# Patient Record
Sex: Female | Born: 1985 | Race: Black or African American | Hispanic: No | Marital: Single | Smoking: Former smoker
Health system: Southern US, Community
[De-identification: ages and names within clinical notes are randomized; demographics above are authoritative.]

## PROBLEM LIST (undated history)

## (undated) DIAGNOSIS — Z8619 Personal history of other infectious and parasitic diseases: Secondary | ICD-10-CM

## (undated) DIAGNOSIS — E785 Hyperlipidemia, unspecified: Secondary | ICD-10-CM

## (undated) DIAGNOSIS — Z5189 Encounter for other specified aftercare: Secondary | ICD-10-CM

## (undated) DIAGNOSIS — R51 Headache: Secondary | ICD-10-CM

## (undated) DIAGNOSIS — IMO0002 Reserved for concepts with insufficient information to code with codable children: Secondary | ICD-10-CM

## (undated) DIAGNOSIS — O09299 Supervision of pregnancy with other poor reproductive or obstetric history, unspecified trimester: Secondary | ICD-10-CM

## (undated) DIAGNOSIS — R87619 Unspecified abnormal cytological findings in specimens from cervix uteri: Secondary | ICD-10-CM

## (undated) HISTORY — DX: Hyperlipidemia, unspecified: E78.5

## (undated) HISTORY — DX: Reserved for concepts with insufficient information to code with codable children: IMO0002

## (undated) HISTORY — DX: Personal history of other infectious and parasitic diseases: Z86.19

## (undated) HISTORY — DX: Unspecified abnormal cytological findings in specimens from cervix uteri: R87.619

## (undated) HISTORY — DX: Headache: R51

## (undated) HISTORY — DX: Encounter for other specified aftercare: Z51.89

## (undated) HISTORY — PX: NOSE SURGERY: SHX723

---

## 2012-06-03 NOTE — L&D Delivery Note (Signed)
Delivery Note At 8:57 PM a viable female was delivered via Vaginal, Spontaneous Delivery (Presentation: Left Occiput Anterior).  APGAR: , ; weight .   Placenta status: Intact, Spontaneous.  Cord: 3 vessels with the following complications: None.  Cord pH: not sent  Anesthesia: Epidural  Episiotomy: None Lacerations: 2nd degree Suture Repair: 3.0 vicryl rapide Est. Blood Loss (mL): 300  Mom to postpartum.  Baby to nursery-stable.  Meriel Pica 01/05/2013, 9:17 PM

## 2012-08-24 LAB — OB RESULTS CONSOLE ABO/RH: RH Type: POSITIVE

## 2012-08-24 LAB — OB RESULTS CONSOLE RUBELLA ANTIBODY, IGM: Rubella: IMMUNE

## 2012-08-24 LAB — OB RESULTS CONSOLE HEPATITIS B SURFACE ANTIGEN: Hepatitis B Surface Ag: NEGATIVE

## 2012-08-24 LAB — OB RESULTS CONSOLE ANTIBODY SCREEN: Antibody Screen: NEGATIVE

## 2012-12-31 ENCOUNTER — Encounter (HOSPITAL_COMMUNITY): Payer: Self-pay | Admitting: *Deleted

## 2012-12-31 ENCOUNTER — Inpatient Hospital Stay (HOSPITAL_COMMUNITY)
Admission: AD | Admit: 2012-12-31 | Discharge: 2012-12-31 | Disposition: A | Discharge status: Home / Self Care | Payer: 59 | Source: Ambulatory Visit | Attending: Obstetrics and Gynecology | Admitting: Obstetrics and Gynecology

## 2012-12-31 DIAGNOSIS — O368131 Decreased fetal movements, third trimester, fetus 1: Secondary | ICD-10-CM

## 2012-12-31 DIAGNOSIS — O368190 Decreased fetal movements, unspecified trimester, not applicable or unspecified: Secondary | ICD-10-CM | POA: Insufficient documentation

## 2012-12-31 NOTE — MAU Note (Signed)
Pt reports she has not felt the baby move for the last 2 hours.

## 2012-12-31 NOTE — MAU Provider Note (Signed)
  History     CSN: [id number]  Arrival date and time: 12/31/12 1957   None     Chief Complaint  Patient presents with  . Decreased Fetal Movement   HPI This is a 27 y.o. at [redacted]w[redacted]d who presents with c/o lack of fetal movement at home. Now feels it moving. Denies pain, leaking or bleeding  RN Note:  Pt reports she has not felt the baby move for the last 2 hours.        OB History   Grav Para Term Preterm Abortions TAB SAB Ect Mult Living   1               Past Medical History  Diagnosis Date  . Medical history non-contributory     Past Surgical History  Procedure Laterality Date  . No past surgeries      History reviewed. No pertinent family history.  History  Substance Use Topics  . Smoking status: Heavy Tobacco Smoker -- 1.00 packs/day  . Smokeless tobacco: Never Used  . Alcohol Use: No    Allergies: No Known Allergies  Prescriptions prior to admission  Medication Sig Dispense Refill  . Prenatal Vit-Fe Fumarate-FA (PRENATAL MULTIVITAMIN) TABS Take 1 tablet by mouth daily.        Review of Systems  Constitutional: Negative for fever and chills.  Gastrointestinal: Negative for nausea, vomiting, abdominal pain, diarrhea and constipation.  Neurological: Negative for dizziness.   Physical Exam   Blood pressure 139/86, pulse 81, temperature 98.7 F (37.1 C), temperature source Oral, resp. rate 20, height 5\' 5"  (1.651 m), weight 109.317 kg (241 lb), SpO2 100.00%.  Physical Exam  Constitutional: She is oriented to person, place, and time. She appears well-developed and well-nourished. No distress.  Cardiovascular: Normal rate.   Respiratory: Effort normal.  GI: Soft. There is no tenderness.  Musculoskeletal: Normal range of motion.  Neurological: She is alert and oriented to person, place, and time.  Skin: Skin is warm and dry.  Psychiatric: She has a normal mood and affect.   Fetal heart rate reactive, numerous accels and average variability Category  I Irregular mild contractions   MAU Course  Procedures  Assessment and Plan  A:  SIUP at [redacted]w[redacted]d      History of decreased fetal movement, now moving a lot      Reassuring fetal heart rate tracing, Category I  P: Discharge home      Discussed with Dr Gweneth Dimitri 12/31/2012, 9:16 PM

## 2013-01-04 ENCOUNTER — Encounter (HOSPITAL_COMMUNITY): Payer: Self-pay | Admitting: *Deleted

## 2013-01-04 ENCOUNTER — Telehealth (HOSPITAL_COMMUNITY): Payer: Self-pay | Admitting: *Deleted

## 2013-01-04 ENCOUNTER — Inpatient Hospital Stay (HOSPITAL_COMMUNITY)
Admission: AD | Admit: 2013-01-04 | Discharge: 2013-01-08 | DRG: 774 | Disposition: A | Discharge status: Home / Self Care | Payer: 59 | Source: Ambulatory Visit | Attending: Obstetrics and Gynecology | Admitting: Obstetrics and Gynecology

## 2013-01-04 DIAGNOSIS — O99892 Other specified diseases and conditions complicating childbirth: Secondary | ICD-10-CM | POA: Diagnosis present

## 2013-01-04 DIAGNOSIS — O139 Gestational [pregnancy-induced] hypertension without significant proteinuria, unspecified trimester: Principal | ICD-10-CM | POA: Diagnosis present

## 2013-01-04 DIAGNOSIS — O163 Unspecified maternal hypertension, third trimester: Secondary | ICD-10-CM

## 2013-01-04 DIAGNOSIS — Z22330 Carrier of Group B streptococcus: Secondary | ICD-10-CM

## 2013-01-04 LAB — COMPREHENSIVE METABOLIC PANEL
ALT: 10 U/L (ref 0–35)
Albumin: 2.6 g/dL — ABNORMAL LOW (ref 3.5–5.2)
Alkaline Phosphatase: 132 U/L — ABNORMAL HIGH (ref 39–117)
BUN: 4 mg/dL — ABNORMAL LOW (ref 6–23)
Calcium: 8.6 mg/dL (ref 8.4–10.5)
GFR calc Af Amer: >90 mL/min (ref >90)
Glucose, Bld: 97 mg/dL (ref 70–99)
Potassium: 3.1 mEq/L — ABNORMAL LOW (ref 3.5–5.1)
Sodium: 134 mEq/L — ABNORMAL LOW (ref 135–145)
Total Protein: 6.1 g/dL (ref 6.0–8.3)

## 2013-01-04 LAB — URINALYSIS, ROUTINE W REFLEX MICROSCOPIC
Bilirubin Urine: NEGATIVE
Ketones, ur: NEGATIVE mg/dL
Leukocytes, UA: NEGATIVE
Nitrite: NEGATIVE
Protein, ur: NEGATIVE mg/dL

## 2013-01-04 LAB — CBC
Hemoglobin: 11 g/dL — ABNORMAL LOW (ref 12.0–15.0)
MCH: 27.7 pg (ref 26.0–34.0)
MCHC: 34.2 g/dL (ref 30.0–36.0)
RDW: 14.3 % (ref 11.5–15.5)

## 2013-01-04 LAB — PROTEIN / CREATININE RATIO, URINE
Creatinine, Urine: 88.33 mg/dL
Protein Creatinine Ratio: 0.17 — ABNORMAL HIGH (ref 0.00–0.15)

## 2013-01-04 MED ORDER — OXYCODONE-ACETAMINOPHEN 5-325 MG PO TABS
1.0000 | ORAL_TABLET | ORAL | Status: DC | PRN
  Administered 2013-01-05: 1 via ORAL
  Filled 2013-01-04: qty 1

## 2013-01-04 MED ORDER — ACETAMINOPHEN 325 MG PO TABS
650.0000 mg | ORAL_TABLET | ORAL | Status: DC | PRN
  Administered 2013-01-05: 650 mg via ORAL
  Filled 2013-01-04: qty 2

## 2013-01-04 MED ORDER — MISOPROSTOL 25 MCG QUARTER TABLET
25.0000 ug | ORAL_TABLET | ORAL | Status: DC | PRN
  Administered 2013-01-04: 25 ug via VAGINAL
  Filled 2013-01-04: qty 0.25
  Filled 2013-01-04: qty 1

## 2013-01-04 MED ORDER — LIDOCAINE HCL (PF) 1 % IJ SOLN
30.0000 mL | INTRAMUSCULAR | Status: DC | PRN
  Administered 2013-01-05: 30 mL via SUBCUTANEOUS
  Filled 2013-01-04 (×2): qty 30

## 2013-01-04 MED ORDER — OXYTOCIN BOLUS FROM INFUSION
500.0000 mL | INTRAVENOUS | Status: DC
  Administered 2013-01-05: 500 mL via INTRAVENOUS

## 2013-01-04 MED ORDER — LACTATED RINGERS IV SOLN
INTRAVENOUS | Status: DC
  Administered 2013-01-05: 07:00:00 via INTRAVENOUS

## 2013-01-04 MED ORDER — LACTATED RINGERS IV SOLN
500.0000 mL | INTRAVENOUS | Status: DC | PRN
  Administered 2013-01-05: 500 mL via INTRAVENOUS

## 2013-01-04 MED ORDER — IBUPROFEN 600 MG PO TABS: 600.0000 mg | ORAL_TABLET | Freq: Four times a day (QID) | ORAL | Status: DC | PRN

## 2013-01-04 MED ORDER — ONDANSETRON HCL 4 MG/2ML IJ SOLN: 4.0000 mg | Freq: Four times a day (QID) | INTRAMUSCULAR | Status: DC | PRN

## 2013-01-04 MED ORDER — OXYTOCIN 40 UNITS IN LACTATED RINGERS INFUSION - SIMPLE MED
1.0000 m[IU]/min | INTRAVENOUS | Status: DC
  Administered 2013-01-05: 1 m[IU]/min via INTRAVENOUS
  Filled 2013-01-04: qty 1000

## 2013-01-04 MED ORDER — TERBUTALINE SULFATE 1 MG/ML IJ SOLN: 0.2500 mg | Freq: Once | INTRAMUSCULAR | Status: AC | PRN

## 2013-01-04 MED ORDER — CITRIC ACID-SODIUM CITRATE 334-500 MG/5ML PO SOLN: 30.0000 mL | ORAL | Status: DC | PRN

## 2013-01-04 MED ORDER — PENICILLIN G POTASSIUM 5000000 UNITS IJ SOLR
2.5000 10*6.[IU] | INTRAVENOUS | Status: DC
  Administered 2013-01-05 (×3): 2.5 10*6.[IU] via INTRAVENOUS
  Filled 2013-01-04 (×8): qty 2.5

## 2013-01-04 MED ORDER — OXYTOCIN 40 UNITS IN LACTATED RINGERS INFUSION - SIMPLE MED
62.5000 mL/h | INTRAVENOUS | Status: DC
  Filled 2013-01-04: qty 1000

## 2013-01-04 MED ORDER — FLEET ENEMA 7-19 GM/118ML RE ENEM: 1.0000 | ENEMA | RECTAL | Status: DC | PRN

## 2013-01-04 MED ORDER — PENICILLIN G POTASSIUM 5000000 UNITS IJ SOLR
5.0000 10*6.[IU] | Freq: Once | INTRAVENOUS | Status: AC
  Administered 2013-01-05: 5 10*6.[IU] via INTRAVENOUS
  Filled 2013-01-04: qty 5

## 2013-01-04 NOTE — MAU Note (Addendum)
PT SAYS   SHE WAS HERE LAST Thursday-  DECREASE FETAL MOVEMENT AND SEEING SPOTS.    SHE WAS AT DR Langston Masker OFFICE TODAY AT 230PM--  TOLD HER TO COME HER  FOR BP-   149/?  .    SAYS LAST WEEK BP WAS ELEVATED- 142/ ?    .  IN OFFICE -UA.     DENIES H/A,  BLURRED VISION, CHEST PAIN.   VE IN OFFICE-  1 CM.    DENIES HSV AND MRSA.  IS AN INDUCTION FOR  8-10.        I CALLED L/D - ST[facility]Y -  SAYS PT IS NOT AN INDUCTION FOR   TODAY.

## 2013-01-04 NOTE — Telephone Encounter (Signed)
Preadmission screen  

## 2013-01-04 NOTE — MAU Note (Signed)
Dr. Vincente Poli notified of patient arrival in MAU. Dr. Vincente Poli indicated that this patient was to be a direct admit to the [facility]. Notified patient of this status.

## 2013-01-04 NOTE — MAU Note (Signed)
GAVE CHRISTY  REPORT- BUT CANNOT TAKE PT X1 HR.

## 2013-01-05 ENCOUNTER — Encounter (HOSPITAL_COMMUNITY): Payer: Self-pay | Admitting: Obstetrics

## 2013-01-05 ENCOUNTER — Inpatient Hospital Stay (HOSPITAL_COMMUNITY): Payer: 59 | Admitting: Anesthesiology

## 2013-01-05 ENCOUNTER — Encounter (HOSPITAL_COMMUNITY): Payer: Self-pay | Admitting: Anesthesiology

## 2013-01-05 DIAGNOSIS — Z5189 Encounter for other specified aftercare: Secondary | ICD-10-CM

## 2013-01-05 HISTORY — DX: Encounter for other specified aftercare: Z51.89

## 2013-01-05 LAB — CBC
HCT: 24.4 % — ABNORMAL LOW (ref 36.0–46.0)
Hemoglobin: 10.9 g/dL — ABNORMAL LOW (ref 12.0–15.0)
Hemoglobin: 8.7 g/dL — ABNORMAL LOW (ref 12.0–15.0)
Hemoglobin: 8.4 g/dL — ABNORMAL LOW (ref 12.0–15.0)
MCH: 28.5 pg (ref 26.0–34.0)
MCHC: 34.4 g/dL (ref 30.0–36.0)
MCHC: 34.5 g/dL (ref 30.0–36.0)
MCHC: 34.4 g/dL (ref 30.0–36.0)
MCV: 82.6 fL (ref 78.0–100.0)
RBC: 3.93 MIL/uL (ref 3.87–5.11)
RBC: 3.05 MIL/uL — ABNORMAL LOW (ref 3.87–5.11)
RBC: 3.01 MIL/uL — ABNORMAL LOW (ref 3.87–5.11)
WBC: 9.3 10*3/uL (ref 4.0–10.5)
WBC: 21.1 10*3/uL — ABNORMAL HIGH (ref 4.0–10.5)

## 2013-01-05 LAB — DIC (DISSEMINATED INTRAVASCULAR COAGULATION)PANEL
Fibrinogen: 384 mg/dL (ref 204–475)
Prothrombin Time: 14.6 seconds (ref 11.6–15.2)
Smear Review: NONE SEEN

## 2013-01-05 LAB — PREPARE RBC (CROSSMATCH)

## 2013-01-05 LAB — RPR: RPR Ser Ql: NONREACTIVE

## 2013-01-05 LAB — POSTPARTUM HEMORRHAGE PROTOCOL (BB NOTIFICATION)

## 2013-01-05 MED ORDER — DIPHENHYDRAMINE HCL 50 MG/ML IJ SOLN: 12.5000 mg | INTRAMUSCULAR | Status: DC | PRN

## 2013-01-05 MED ORDER — METHYLERGONOVINE MALEATE 0.2 MG/ML IJ SOLN: 0.2000 mg | Freq: Once | INTRAMUSCULAR | Status: AC

## 2013-01-05 MED ORDER — EPHEDRINE 5 MG/ML INJ
10.0000 mg | INTRAVENOUS | Status: DC | PRN
  Filled 2013-01-05: qty 2

## 2013-01-05 MED ORDER — LACTATED RINGERS IV SOLN: INTRAVENOUS | Status: DC

## 2013-01-05 MED ORDER — PROMETHAZINE HCL 25 MG/ML IJ SOLN
12.5000 mg | Freq: Once | INTRAMUSCULAR | Status: AC
  Administered 2013-01-05: 12.5 mg via INTRAVENOUS
  Filled 2013-01-05: qty 1

## 2013-01-05 MED ORDER — OXYTOCIN 40 UNITS IN LACTATED RINGERS INFUSION - SIMPLE MED
125.0000 mL/h | INTRAVENOUS | Status: DC
  Administered 2013-01-06: 125 mL/h via INTRAVENOUS

## 2013-01-05 MED ORDER — PHENYLEPHRINE 40 MCG/ML (10ML) SYRINGE FOR IV PUSH (FOR BLOOD PRESSURE SUPPORT)
80.0000 ug | PREFILLED_SYRINGE | INTRAVENOUS | Status: DC | PRN
  Filled 2013-01-05: qty 2

## 2013-01-05 MED ORDER — FENTANYL 2.5 MCG/ML BUPIVACAINE 1/10 % EPIDURAL INFUSION (WH - ANES)
14.0000 mL/h | INTRAMUSCULAR | Status: DC | PRN
  Administered 2013-01-05: 14 mL/h via EPIDURAL
  Filled 2013-01-05 (×2): qty 125

## 2013-01-05 MED ORDER — METHYLERGONOVINE MALEATE 0.2 MG/ML IJ SOLN
INTRAMUSCULAR | Status: AC
  Administered 2013-01-05: 0.2 mg via INTRAMUSCULAR
  Filled 2013-01-05: qty 1

## 2013-01-05 MED ORDER — EPHEDRINE 5 MG/ML INJ
10.0000 mg | INTRAVENOUS | Status: DC | PRN
  Filled 2013-01-05: qty 4
  Filled 2013-01-05: qty 2

## 2013-01-05 MED ORDER — LIDOCAINE HCL (PF) 1 % IJ SOLN
INTRAMUSCULAR | Status: DC | PRN
  Administered 2013-01-05 (×4): 4 mL

## 2013-01-05 MED ORDER — CARBOPROST TROMETHAMINE 250 MCG/ML IM SOLN
INTRAMUSCULAR | Status: AC
  Administered 2013-01-05: 250 ug via INTRAMUSCULAR
  Filled 2013-01-05: qty 1

## 2013-01-05 MED ORDER — BUTORPHANOL TARTRATE 1 MG/ML IJ SOLN
1.0000 mg | Freq: Once | INTRAMUSCULAR | Status: AC
  Administered 2013-01-05: 1 mg via INTRAVENOUS
  Filled 2013-01-05: qty 1

## 2013-01-05 MED ORDER — ZOLPIDEM TARTRATE 5 MG PO TABS
5.0000 mg | ORAL_TABLET | Freq: Every evening | ORAL | Status: DC | PRN
  Administered 2013-01-05: 5 mg via ORAL
  Filled 2013-01-05: qty 1

## 2013-01-05 MED ORDER — LACTATED RINGERS IV SOLN
500.0000 mL | Freq: Once | INTRAVENOUS | Status: AC
  Administered 2013-01-05: 500 mL via INTRAVENOUS

## 2013-01-05 MED ORDER — PHENYLEPHRINE 40 MCG/ML (10ML) SYRINGE FOR IV PUSH (FOR BLOOD PRESSURE SUPPORT)
80.0000 ug | PREFILLED_SYRINGE | INTRAVENOUS | Status: DC | PRN
  Filled 2013-01-05: qty 2
  Filled 2013-01-05: qty 5

## 2013-01-05 MED ORDER — CARBOPROST TROMETHAMINE 250 MCG/ML IM SOLN: 250.0000 ug | Freq: Once | INTRAMUSCULAR | Status: AC

## 2013-01-05 NOTE — Progress Notes (Signed)
Pt states she feels very hot. Wants infant "off her chest." Pt continues to take BP cuff off when inflating. BP cuff put back on pt with importance of BPs after delivery stressed to patient. Low bp noted. Fundus firm and 1 below umbilicus. Pitocin bolusing at 500cc per standing orders. Monitoring closely.

## 2013-01-05 NOTE — Progress Notes (Signed)
Pt states she is feeling better. More talkative. Phlebotomy at bedside for repeat cbc.

## 2013-01-05 NOTE — Anesthesia Procedure Notes (Signed)
Epidural Patient location during procedure: OB Start time: 01/05/2013 9:46 AM  Staffing Performed by: anesthesiologist   Preanesthetic Checklist Completed: patient identified, site marked, surgical consent, pre-op evaluation, timeout performed, IV checked, risks and benefits discussed and monitors and equipment checked  Epidural Patient position: sitting Prep: site prepped and draped and DuraPrep Patient monitoring: continuous pulse ox and blood pressure Approach: midline Injection technique: LOR air  Needle:  Needle type: Tuohy  Needle gauge: 17 G Needle length: 9 cm and 9 Needle insertion depth: 8 cm Catheter type: closed end flexible Catheter size: 19 Gauge Catheter at skin depth: 13 cm Test dose: negative  Assessment Events: blood not aspirated, injection not painful, no injection resistance, negative IV test and no paresthesia  Additional Notes Discussed risk of headache, infection, bleeding, nerve injury and failed or incomplete block.  Patient voices understanding and wishes to proceed.  Epidural placed easily on first attempt.  No paresthesia.  Patient tolerated procedure well with no apparent complications.  Jasmine December, MD Reason for block:procedure for pain

## 2013-01-05 NOTE — Progress Notes (Signed)
Comfortable after epidural, now 5+/90/-1, IUPC placed, ISE already on, stable FHR

## 2013-01-05 NOTE — Anesthesia Preprocedure Evaluation (Signed)
Anesthesia Evaluation  Patient identified by MRN, date of birth, ID band Patient awake    Reviewed: Allergy & Precautions, H&P , NPO status , Patient's Chart, lab work & pertinent test results, reviewed documented beta blocker date and time   History of Anesthesia Complications Negative for: history of anesthetic complications  Airway Mallampati: III TM Distance: >3 FB Neck ROM: full    Dental  (+) Teeth Intact   Pulmonary Current Smoker,  breath sounds clear to auscultation        Cardiovascular hypertension (PIH), Rhythm:regular Rate:Normal     Neuro/Psych  Headaches, negative psych ROS   GI/Hepatic negative GI ROS, Neg liver ROS,   Endo/Other  Morbid obesity  Renal/GU negative Renal ROS     Musculoskeletal   Abdominal   Peds  Hematology negative hematology ROS (+)   Anesthesia Other Findings   Reproductive/Obstetrics (+) Pregnancy                           Anesthesia Physical Anesthesia Plan  ASA: III  Anesthesia Plan: Epidural   Post-op Pain Management:    Induction:   Airway Management Planned:   Additional Equipment:   Intra-op Plan:   Post-operative Plan:   Informed Consent: I have reviewed the patients History and Physical, chart, labs and discussed the procedure including the risks, benefits and alternatives for the proposed anesthesia with the patient or authorized representative who has indicated his/her understanding and acceptance.     Plan Discussed with:   Anesthesia Plan Comments:         Anesthesia Quick Evaluation

## 2013-01-05 NOTE — H&P (Signed)
Melanie Stout is a 27 y.o. female presenting for labor induction due to gest HTN. Maternal Medical History:  Contractions: Onset was 3-5 hours ago.   Frequency: irregular.   Perceived severity is mild.    Fetal activity: Perceived fetal activity is normal.      OB History   Grav Para Term Preterm Abortions TAB SAB Ect Mult Living   1              Past Medical History  Diagnosis Date  . Hx of varicella   . Abnormal Pap smear   . Headache(784.0)   . PIH (pregnancy induced hypertension)    Past Surgical History  Procedure Laterality Date  . No past surgeries     Family History: family history includes Diabetes in her maternal aunt; Hypertension in her mother; and Parkinson's disease in her father. Social History:  reports that she has been smoking.  She has never used smokeless tobacco. She reports that she does not drink alcohol or use illicit drugs.   Prenatal Transfer Tool  Maternal Diabetes: No Genetic Screening: Normal Maternal Ultrasounds/Referrals: Normal Fetal Ultrasounds or other Referrals:  None Maternal Substance Abuse:  No Significant Maternal Medications:  None Significant Maternal Lab Results:  None Other Comments:  None  ROS  Dilation: 1 Effacement (%): 60 Station: -2 Exam by:: [email or web address]-[email or web address], rnc Blood pressure 138/82, pulse 64, temperature 97.8 F (36.6 C), temperature source Oral, resp. rate 20, height 5\' 5"  (1.651 m), weight 241 lb 2 oz (109.374 kg). Maternal Exam:  Uterine Assessment: Contraction strength is mild.  Contraction frequency is irregular.   Abdomen: Patient reports no abdominal tenderness. Fundal height is term FH.   Estimated fetal weight is AGA.   Fetal presentation: vertex  Introitus: Normal vulva. Normal vagina.  Amniotic fluid character: clear.  Pelvis: adequate for delivery.   Cervix: Cervix evaluated by digital exam.     Physical Exam  Constitutional: She is oriented to person, place, and time. She appears  well-developed and well-nourished.  HENT:  Head: Normocephalic and atraumatic.  Neck: Normal range of motion. Neck supple.  Cardiovascular: Normal rate and regular rhythm.   Respiratory: Effort normal and breath sounds normal.  GI:  Term FH, FHR 148  Genitourinary:  1/25/vtx, AROM by ISE>>clr  Musculoskeletal: Normal range of motion.  Neurological: She is alert and oriented to person, place, and time.    Prenatal labs: ABO, Rh: O/Positive/-- (03/24 0000) Antibody: Negative (03/24 0000) Rubella: Immune (03/24 0000) RPR: NON REACTIVE (08/04 2240)  HBsAg: Negative (03/24 0000)  HIV: Non-reactive (03/24 0000)  GBS: Positive (07/11 0000)   Assessment/Plan: [redacted]w[redacted]d , gest HTN, + GBS, adm for induction, protocol discussed   Dayle Sherpa M 01/05/2013, 7:31 AM

## 2013-01-05 NOTE — Progress Notes (Signed)
Dr Marcelle Overlie updated on pt status: vital signs, maternal bleeding, fundal tone. Plan to administer hemabate IM. Awaiting CBC results.

## 2013-01-05 NOTE — Progress Notes (Addendum)
Dr Marcelle Overlie and Dr Lilli Light notified via phone regarding maternal vital signs, pt symptoms. Discussed fundal assessment and bleeding. Plan to administer methergine IM and obtain stat cbc. 500cc LR bolus administered. Pitocin continues.

## 2013-01-05 NOTE — Progress Notes (Addendum)
Spoke with Dr. Marcelle Overlie via phone regarding pt status. EBL of 785 since delivery portrayed. Current vital signs and pt symptoms discussed. CBC results portrayed to MD along with bleeding status. Plan to type and cross x 2 units. Plan to recheck CBC in one hour and continue pitocin at 171ml/hr with LR at 256ml/hr. Will call MD with results of CBC and any pt status changes. House coverage notified along with blood bank of the activation of a stage 2 hemorrhage. SCDs on and working, maternal pulse ox monitoring, along with O2 administration via face mask, per protocol. 2nd IV also obtained.

## 2013-01-06 ENCOUNTER — Encounter (HOSPITAL_COMMUNITY): Payer: Self-pay | Admitting: *Deleted

## 2013-01-06 LAB — CBC
HCT: 18 % — ABNORMAL LOW (ref 36.0–46.0)
HCT: 15.8 % — ABNORMAL LOW (ref 36.0–46.0)
HCT: 15.7 % — ABNORMAL LOW (ref 36.0–46.0)
Hemoglobin: 6.2 g/dL — CL (ref 12.0–15.0)
Hemoglobin: 5.6 g/dL — CL (ref 12.0–15.0)
MCH: 29 pg (ref 26.0–34.0)
MCH: 27.7 pg (ref 26.0–34.0)
MCHC: 35.4 g/dL (ref 30.0–36.0)
MCV: 81.1 fL (ref 78.0–100.0)
MCV: 81.9 fL (ref 78.0–100.0)
MCV: 80.5 fL (ref 78.0–100.0)
Platelets: 186 10*3/uL (ref 150–400)
RBC: 2.22 MIL/uL — ABNORMAL LOW (ref 3.87–5.11)
RBC: 1.93 MIL/uL — ABNORMAL LOW (ref 3.87–5.11)
RDW: 14.4 % (ref 11.5–15.5)
WBC: 27.1 10*3/uL — ABNORMAL HIGH (ref 4.0–10.5)
WBC: 25.5 10*3/uL — ABNORMAL HIGH (ref 4.0–10.5)

## 2013-01-06 LAB — ABO/RH: ABO/RH(D): O POS

## 2013-01-06 MED ORDER — FERROUS SULFATE 325 (65 FE) MG PO TABS
325.0000 mg | ORAL_TABLET | Freq: Three times a day (TID) | ORAL | Status: DC
  Administered 2013-01-06 – 2013-01-08 (×8): 325 mg via ORAL
  Filled 2013-01-06 (×7): qty 1

## 2013-01-06 MED ORDER — BISACODYL 10 MG RE SUPP: 10.0000 mg | Freq: Every day | RECTAL | Status: DC | PRN

## 2013-01-06 MED ORDER — BENZOCAINE-MENTHOL 20-0.5 % EX AERO
1.0000 "application " | INHALATION_SPRAY | CUTANEOUS | Status: DC | PRN
  Administered 2013-01-07: 1 via TOPICAL
  Filled 2013-01-06: qty 56

## 2013-01-06 MED ORDER — OXYCODONE-ACETAMINOPHEN 5-325 MG PO TABS
1.0000 | ORAL_TABLET | Freq: Four times a day (QID) | ORAL | Status: DC | PRN
  Administered 2013-01-06: 1 via ORAL
  Administered 2013-01-08: 2 via ORAL
  Administered 2013-01-08: 1 via ORAL
  Filled 2013-01-06: qty 2
  Filled 2013-01-06 (×2): qty 1

## 2013-01-06 MED ORDER — TETANUS-DIPHTH-ACELL PERTUSSIS 5-2.5-18.5 LF-MCG/0.5 IM SUSP: 0.5000 mL | Freq: Once | INTRAMUSCULAR | Status: DC

## 2013-01-06 MED ORDER — ONDANSETRON HCL 4 MG PO TABS: 4.0000 mg | ORAL_TABLET | ORAL | Status: DC | PRN

## 2013-01-06 MED ORDER — SIMETHICONE 80 MG PO CHEW: 80.0000 mg | CHEWABLE_TABLET | ORAL | Status: DC | PRN

## 2013-01-06 MED ORDER — FLEET ENEMA 7-19 GM/118ML RE ENEM: 1.0000 | ENEMA | Freq: Every day | RECTAL | Status: DC | PRN

## 2013-01-06 MED ORDER — SENNOSIDES-DOCUSATE SODIUM 8.6-50 MG PO TABS
2.0000 | ORAL_TABLET | Freq: Every day | ORAL | Status: DC
Start: 2013-01-06 — End: 2013-01-08
  Administered 2013-01-07 (×2): 2 via ORAL

## 2013-01-06 MED ORDER — ACETAMINOPHEN 325 MG PO TABS
650.0000 mg | ORAL_TABLET | Freq: Once | ORAL | Status: AC
  Administered 2013-01-06: 650 mg via ORAL
  Filled 2013-01-06: qty 2

## 2013-01-06 MED ORDER — WITCH HAZEL-GLYCERIN EX PADS: 1.0000 "application " | MEDICATED_PAD | CUTANEOUS | Status: DC | PRN

## 2013-01-06 MED ORDER — DIBUCAINE 1 % RE OINT: 1.0000 "application " | TOPICAL_OINTMENT | RECTAL | Status: DC | PRN

## 2013-01-06 MED ORDER — IBUPROFEN 800 MG PO TABS
800.0000 mg | ORAL_TABLET | Freq: Three times a day (TID) | ORAL | Status: DC | PRN
  Administered 2013-01-06 – 2013-01-08 (×7): 800 mg via ORAL
  Filled 2013-01-06 (×7): qty 1

## 2013-01-06 MED ORDER — ZOLPIDEM TARTRATE 5 MG PO TABS: 5.0000 mg | ORAL_TABLET | Freq: Every evening | ORAL | Status: DC | PRN

## 2013-01-06 MED ORDER — PRENATAL MULTIVITAMIN CH
1.0000 | ORAL_TABLET | Freq: Every day | ORAL | Status: DC
Start: 2013-01-06 — End: 2013-01-08
  Administered 2013-01-06 – 2013-01-08 (×3): 1 via ORAL
  Filled 2013-01-06 (×3): qty 1

## 2013-01-06 MED ORDER — ONDANSETRON HCL 4 MG/2ML IJ SOLN: 4.0000 mg | INTRAMUSCULAR | Status: DC | PRN

## 2013-01-06 MED ORDER — LANOLIN HYDROUS EX OINT: TOPICAL_OINTMENT | CUTANEOUS | Status: DC | PRN

## 2013-01-06 MED ORDER — DIPHENHYDRAMINE HCL 50 MG/ML IJ SOLN
25.0000 mg | Freq: Once | INTRAMUSCULAR | Status: AC
  Administered 2013-01-06: 25 mg via INTRAVENOUS
  Filled 2013-01-06: qty 1

## 2013-01-06 MED ORDER — MEASLES, MUMPS & RUBELLA VAC ~~LOC~~ INJ
0.5000 mL | INJECTION | Freq: Once | SUBCUTANEOUS | Status: DC
  Filled 2013-01-06: qty 0.5

## 2013-01-06 MED ORDER — DIPHENHYDRAMINE HCL 25 MG PO CAPS: 25.0000 mg | ORAL_CAPSULE | Freq: Four times a day (QID) | ORAL | Status: DC | PRN

## 2013-01-06 MED ORDER — LACTATED RINGERS IV SOLN
INTRAVENOUS | Status: DC
  Administered 2013-01-06: 09:00:00 via INTRAVENOUS

## 2013-01-06 NOTE — Progress Notes (Signed)
Pt. Insisted on going outside in wheelchair with FOB. No complaints of dizziness or lightheadedness. Discussed risks of potential to pass out. Stated she was willing to take the risk.

## 2013-01-06 NOTE — Progress Notes (Signed)
Patient ID: Melanie Stout, female   DOB: 1985-11-28, 28 y.o.   MRN: [id number] hgb down to 5.4 Not symptomatic After discussion will transfuse 2 u prbc risk discussed including AIDS, hepatitis and transfusion reaction

## 2013-01-06 NOTE — Progress Notes (Signed)
Spoke with Dr. Marcelle Overlie via phone. Repeat cbc portrayed. Fundal tone and bleeding discussed. Plan to observed patient for 1-2 more hours on L&D and if stable, pt may be transferred to [facility]. Pt feeling much better. Orders for repeat CBC in am at 0500 along with continuing of Pitocin at 174ml/hr overnight.

## 2013-01-06 NOTE — Anesthesia Postprocedure Evaluation (Signed)
  Anesthesia Post-op Note  Patient: Melanie Stout  Procedure(s) Performed: * No procedures listed *  Patient Location: PACU and Mother/Baby  Anesthesia Type:Epidural  Level of Consciousness: awake, alert  and oriented  Airway and Oxygen Therapy: Patient Spontanous Breathing  Post-op Pain: none  Post-op Assessment: Post-op Vital signs reviewed, Patient's Cardiovascular Status Stable, No headache, No backache, No residual numbness and No residual motor weakness  Post-op Vital Signs: Reviewed and stable  Complications: No apparent anesthesia complications

## 2013-01-06 NOTE — Progress Notes (Signed)
Post Partum Day 1 Subjective: tolerating PO and denies SOB or dizziness this am.   Objective: Blood pressure 109/71, pulse 98, temperature 98.8 F (37.1 C), temperature source Oral, resp. rate 20, height 5\' 5"  (1.651 m), weight 241 lb 2 oz (109.374 kg), SpO2 100.00%, unknown if currently breastfeeding.  Physical Exam:  General: alert and cooperative Lochia: appropriate Uterine Fundus: firm Incision: perineum intact DVT Evaluation: No evidence of DVT seen on physical exam. Negative Homan's sign. No cords or calf tenderness. Calf/Ankle edema is present. DTR's 1+ Foley with adequate output   Recent Labs  01/05/13 2315 01/06/13 0625  HGB 8.4* 6.2*  HCT 24.4* 18.0*    Assessment/Plan: Plan for discharge tomorrow  Repeat CBC at 12 pm   Feso4 TID May discontinue foley if patient is able to ambulate this am without dizziness   LOS: 2 days   Evani Shrider G 01/06/2013, 7:58 AM

## 2013-01-07 LAB — TYPE AND SCREEN
Antibody Screen: NEGATIVE
Unit division: 0

## 2013-01-07 LAB — CBC
Hemoglobin: 7.1 g/dL — ABNORMAL LOW (ref 12.0–15.0)
MCH: 28.6 pg (ref 26.0–34.0)
MCHC: 35.7 g/dL (ref 30.0–36.0)
RDW: 14.1 % (ref 11.5–15.5)

## 2013-01-07 MED ORDER — FERROUS SULFATE 325 (65 FE) MG PO TABS: 325.0000 mg | ORAL_TABLET | Freq: Three times a day (TID) | ORAL | Status: DC

## 2013-01-07 MED ORDER — OXYCODONE-ACETAMINOPHEN 5-325 MG PO TABS: 1.0000 | ORAL_TABLET | Freq: Four times a day (QID) | ORAL | Status: DC | PRN

## 2013-01-07 MED ORDER — PNEUMOCOCCAL VAC POLYVALENT 25 MCG/0.5ML IJ INJ
0.5000 mL | INJECTION | INTRAMUSCULAR | Status: AC
  Administered 2013-01-08: 0.5 mL via INTRAMUSCULAR
  Filled 2013-01-07: qty 0.5

## 2013-01-07 MED ORDER — IBUPROFEN 800 MG PO TABS: 800.0000 mg | ORAL_TABLET | Freq: Three times a day (TID) | ORAL | Status: DC | PRN

## 2013-01-07 NOTE — Progress Notes (Signed)
Discussed with Dr Henderson Cloud.  RN may remove epidural catheter

## 2013-01-07 NOTE — Discharge Summary (Signed)
Obstetric Discharge Summary Reason for Admission: induction of labor Prenatal Procedures: ultrasound Intrapartum Procedures: spontaneous vaginal delivery Postpartum Procedures: transfusion 2 units Complications-Operative and Postpartum: 2 degree perineal laceration Hemoglobin  Date Value Range Status  01/07/2013 7.1* 12.0 - 15.0 Stout/dL Final     DELTA CHECK NOTED     REPEATED TO VERIFY     POST TRANSFUSION SPECIMEN     HCT  Date Value Range Status  01/07/2013 19.9* 36.0 - 46.0 % Final    Physical Exam:  General: alert and cooperative Lochia: appropriate Uterine Fundus: firm Incision: perineum intact DVT Evaluation: No evidence of DVT seen on physical exam. Negative Homan's sign. No cords or calf tenderness. No significant calf/ankle edema.  Discharge Diagnoses: Term Pregnancy-delivered and postpartum hemorrhage  Discharge Information: Date: 01/07/2013 Activity: pelvic rest Diet: routine Medications: PNV, Ibuprofen, Percocet and Fe Condition: stable Instructions: refer to practice specific booklet Discharge to: home   Newborn Data: Live born female  Birth Weight: 8 lb 0.9 oz (3655 Stout) APGAR: 8, 9  Home with mother.  Melanie Stout 01/07/2013, 8:23 AM

## 2013-01-08 NOTE — Progress Notes (Signed)
Post Partum Day 3 Subjective: no complaints, up ad lib, voiding and tolerating PO.  Feels weak but no lightheadedness or dizziness  Objective: Blood pressure 135/82, pulse 81, temperature 98 F (36.7 C), temperature source Oral, resp. rate 20, height 5\' 5"  (1.651 m), weight 109.374 kg (241 lb 2 oz), SpO2 100.00%, unknown if currently breastfeeding.  Physical Exam:  General: alert and cooperative Lochia: appropriate Uterine Fundus: firm Incision: n/a DVT Evaluation: No evidence of DVT seen on physical exam.   Recent Labs  01/06/13 1554 01/07/13 0600  HGB 5.4* 7.1*  HCT 15.7* 19.9*    Assessment/Plan: Discharge home Fu office 1 wk for hgb check   LOS: 4 days   Melanie Stout 01/08/2013, 10:45 AM

## 2013-01-10 ENCOUNTER — Inpatient Hospital Stay (HOSPITAL_COMMUNITY): Admission: RE | Admit: 2013-01-10 | Payer: 59 | Source: Ambulatory Visit

## 2014-04-04 ENCOUNTER — Encounter (HOSPITAL_COMMUNITY): Payer: Self-pay | Admitting: *Deleted

## 2014-12-12 ENCOUNTER — Emergency Department (HOSPITAL_COMMUNITY): Payer: BLUE CROSS/BLUE SHIELD

## 2014-12-12 ENCOUNTER — Encounter (HOSPITAL_BASED_OUTPATIENT_CLINIC_OR_DEPARTMENT_OTHER): Payer: Self-pay | Admitting: *Deleted

## 2014-12-12 ENCOUNTER — Emergency Department (HOSPITAL_BASED_OUTPATIENT_CLINIC_OR_DEPARTMENT_OTHER)
Admission: EM | Admit: 2014-12-12 | Discharge: 2014-12-12 | Disposition: A | Discharge status: Home / Self Care | Payer: BLUE CROSS/BLUE SHIELD | Attending: Emergency Medicine | Admitting: Emergency Medicine

## 2014-12-12 ENCOUNTER — Emergency Department (HOSPITAL_COMMUNITY): Admission: EM | Admit: 2014-12-12 | Discharge: 2014-12-12 | Discharge status: Absent without leave | Payer: 59

## 2014-12-12 DIAGNOSIS — M79602 Pain in left arm: Secondary | ICD-10-CM

## 2014-12-12 DIAGNOSIS — R42 Dizziness and giddiness: Secondary | ICD-10-CM

## 2014-12-12 DIAGNOSIS — M542 Cervicalgia: Secondary | ICD-10-CM | POA: Insufficient documentation

## 2014-12-12 DIAGNOSIS — R202 Paresthesia of skin: Secondary | ICD-10-CM | POA: Insufficient documentation

## 2014-12-12 DIAGNOSIS — M79601 Pain in right arm: Secondary | ICD-10-CM

## 2014-12-12 DIAGNOSIS — R200 Anesthesia of skin: Secondary | ICD-10-CM | POA: Diagnosis present

## 2014-12-12 LAB — URINE MICROSCOPIC-ADD ON

## 2014-12-12 LAB — URINALYSIS, ROUTINE W REFLEX MICROSCOPIC
GLUCOSE, UA: NEGATIVE mg/dL
Hgb urine dipstick: NEGATIVE
KETONES UR: 15 mg/dL — AB
LEUKOCYTES UA: NEGATIVE
NITRITE: NEGATIVE
Protein, ur: 100 mg/dL — AB
Specific Gravity, Urine: 1.039 — ABNORMAL HIGH (ref 1.005–1.030)
Urobilinogen, UA: 1 mg/dL (ref 0.0–1.0)
pH: 6 (ref 5.0–8.0)

## 2014-12-12 LAB — CBG MONITORING, ED: Glucose-Capillary: 108 mg/dL — ABNORMAL HIGH (ref 65–99)

## 2014-12-12 LAB — PREGNANCY, URINE: Preg Test, Ur: NEGATIVE

## 2014-12-12 NOTE — ED Notes (Signed)
Patient not in room
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
## 2014-12-12 NOTE — Discharge Instructions (Signed)
1. Medications: usual home medications 2. Treatment: rest, drink plenty of fluids,  3. Follow Up: Please followup with your primary doctor in 3 days for discussion of your diagnoses and further evaluation after today's visit; if you do not have a primary care doctor use the resource guide provided to find one; Please return to the ER for worsening symptoms or other concerns.   Paresthesia Paresthesia is an abnormal burning or prickling sensation. This sensation is generally felt in the hands, arms, legs, or feet. However, it may occur in any part of the body. It is usually not painful. The feeling may be described as:  Tingling or numbness.  "Pins and needles."  Skin crawling.  Buzzing.  Limbs "falling asleep."  Itching. Most people experience temporary (transient) paresthesia at some time in their lives. CAUSES  Paresthesia may occur when you breathe too quickly (hyperventilation). It can also occur without any apparent cause. Commonly, paresthesia occurs when pressure is placed on a nerve. The feeling quickly goes away once the pressure is removed. For some people, however, paresthesia is a long-lasting (chronic) condition caused by an underlying disorder. The underlying disorder may be:  A traumatic, direct injury to nerves. Examples include a:  Broken (fractured) neck.  Fractured skull.  A disorder affecting the brain and spinal cord (central nervous system). Examples include:  Transverse myelitis.  Encephalitis.  Transient ischemic attack.  Multiple sclerosis.  Stroke.  Tumor or blood vessel problems, such as an arteriovenous malformation pressing against the brain or spinal cord.  A condition that damages the peripheral nerves (peripheral neuropathy). Peripheral nerves are not part of the brain and spinal cord. These conditions include:  Diabetes.  Peripheral vascular disease.  Nerve entrapment syndromes, such as carpal tunnel  syndrome.  Shingles.  Hypothyroidism.  Vitamin B12 deficiencies.  Alcoholism.  Heavy metal poisoning (lead, arsenic).  Rheumatoid arthritis.  Systemic lupus erythematosus. DIAGNOSIS  Your caregiver will attempt to find the underlying cause of your paresthesia. Your caregiver may:  Take your medical history.  Perform a physical exam.  Order various lab tests.  Order imaging tests. TREATMENT  Treatment for paresthesia depends on the underlying cause. HOME CARE INSTRUCTIONS  Avoid drinking alcohol.  You may consider massage or acupuncture to help relieve your symptoms.  Keep all follow-up appointments as directed by your caregiver. SEEK IMMEDIATE MEDICAL CARE IF:   You feel weak.  You have trouble walking or moving.  You have problems with speech or vision.  You feel confused.  You cannot control your bladder or bowel movements.  You feel numbness after an injury.  You faint.  Your burning or prickling feeling gets worse when walking.  You have pain, cramps, or dizziness.  You develop a rash. MAKE SURE YOU:  Understand these instructions.  Will watch your condition.  Will get help right away if you are not doing well or get worse. Document Released: 05/10/2002 Document Revised: 08/12/2011 Document Reviewed: 02/08/2011 [facility] Patient Information 2015 [employer] [place]. This information is not intended to replace advice given to you by your health care provider. Make sure you discuss any questions you have with your health care provider.

## 2014-12-12 NOTE — ED Notes (Signed)
MRI called for transport of PT to MRI.

## 2014-12-12 NOTE — ED Notes (Signed)
Pt reports to be claustrophobic and pt reports  She drove to ED for test. Pt reports  No one is able to pick her up..Marland Kitchen

## 2014-12-12 NOTE — ED Notes (Signed)
[facility] and not to eat or drink prior to being seen by MD- Pt verbalizes understanding

## 2014-12-12 NOTE — ED Notes (Signed)
Pt states has not felt well for the past week, under a lot stress, c/o neck, head, shoulder aching, has had some diaphoretic episodes per pt statement

## 2014-12-12 NOTE — ED Notes (Signed)
Transfer procedure explained to pt. Paperwork given. Pt asked to notify RN when her ride arrives

## 2014-12-12 NOTE — ED Notes (Signed)
Neck and back pain, weakness, states under a lot of stress now

## 2014-12-12 NOTE — ED Notes (Signed)
[facility]

## 2014-12-12 NOTE — ED Notes (Signed)
[facility] and possible admission for weakness and numbness in BLE, bilateral hands and dizziness since waking this am. This week shes had sinus infection with headache and facial pain. She is A&Ox4, resp e/u
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
## 2014-12-13 ENCOUNTER — Encounter (HOSPITAL_COMMUNITY): Payer: Self-pay | Admitting: *Deleted

## 2015-07-28 ENCOUNTER — Telehealth: Payer: Self-pay | Admitting: Family Medicine

## 2015-07-28 ENCOUNTER — Telehealth: Payer: Self-pay | Admitting: Physician Assistant

## 2015-07-28 NOTE — Telephone Encounter (Signed)
Called to follow up patient.  Pt states she did not go to ER.  She says that she took an aspirin and  is beginning to feel better.  She says that she's going to rest and if symptoms worsen again or new symptoms develop, she will go to the ER.  Pt was strongly advised to go to the ER given persistent chest pain.  Pt declined stating she would wait.  Acute appt scheduled Dr. Patsy Lager on Monday.  Pt was again advised is symptoms worsen to go to ER.  She stated understanding and agreed.

## 2015-07-28 NOTE — Telephone Encounter (Signed)
Pt called in stating constant chest pains, scheduled new pt appt 08/09/15 with Dr. Patsy Lager, transferred call to [facility] with [facility] [employer].

## 2015-07-28 NOTE — Telephone Encounter (Signed)
Patient Name: Tifanie CHAMBERL AIN DOB: May 10, 1986 Initial Comment Caller states she has constant chest pain. Nurse Assessment Nurse: Yetta Barre, RN, Miranda Date/Time (Eastern Time): 07/28/2015 2:15:49 PM Confirm and document reason for call. If symptomatic, describe symptoms. You must click the next button to save text entered. ---Caller states she has been having constant pain in her chest for a couple of days. Has the patient traveled out of the country within the last 30 days? ---Not Applicable Does the patient have any new or worsening symptoms? ---Yes Will a triage be completed? ---Yes Related visit to physician within the last 2 weeks? ---No Does the PT have any chronic conditions? (i.e. diabetes, asthma, etc.) ---No Is the patient pregnant or possibly pregnant? (Ask all females between the ages of 35-55) ---No Is this a behavioral health or substance abuse call? ---No Guidelines Guideline Title Affirmed Question Affirmed Notes Chest Pain SEVERE chest pain Final Disposition User Go to ED Now Yetta Barre, RN, Miranda Comments 8/10 pain scale. Referrals GO TO FACILITY REFUSED Disagree/Comply: Disagree

## 2015-07-28 NOTE — Telephone Encounter (Signed)
error 

## 2015-07-31 ENCOUNTER — Ambulatory Visit (HOSPITAL_BASED_OUTPATIENT_CLINIC_OR_DEPARTMENT_OTHER)
Admission: RE | Admit: 2015-07-31 | Discharge: 2015-07-31 | Disposition: A | Discharge status: Home / Self Care | Payer: 59 | Source: Ambulatory Visit | Attending: Family Medicine | Admitting: Family Medicine

## 2015-07-31 ENCOUNTER — Ambulatory Visit (INDEPENDENT_AMBULATORY_CARE_PROVIDER_SITE_OTHER): Payer: 59 | Admitting: Family Medicine

## 2015-07-31 VITALS — BP 120/84 | HR 84 | Temp 98.3°F | Ht 66.0 in | Wt 248.8 lb

## 2015-07-31 DIAGNOSIS — R079 Chest pain, unspecified: Secondary | ICD-10-CM | POA: Insufficient documentation

## 2015-07-31 LAB — CBC
HEMATOCRIT: 38.5 % (ref 36.0–46.0)
HEMOGLOBIN: 13 g/dL (ref 12.0–15.0)
MCHC: 33.8 g/dL (ref 30.0–36.0)
MCV: 81.7 fl (ref 78.0–100.0)
Platelets: 299 10*3/uL (ref 150.0–400.0)
RBC: 4.71 Mil/uL (ref 3.87–5.11)
RDW: 13.9 % (ref 11.5–15.5)
WBC: 6.5 10*3/uL (ref 4.0–10.5)

## 2015-07-31 LAB — TROPONIN I: TNIDX: 0 ug/L (ref 0.00–0.06)

## 2015-07-31 NOTE — Progress Notes (Signed)
Pre visit review using our clinic review tool, if applicable. No additional management support is needed unless otherwise documented below in the visit note. 

## 2015-07-31 NOTE — Progress Notes (Addendum)
[facility] at [facility] [address] [place], [place] [address] [phone]  Date:  07/31/2015   Name:  Melanie Stout   DOB:  1986/04/06   MRN:  [id number]  PCP:  Jamal Collin, PA-C    Chief Complaint: Chest Pain   History of Present Illness:  Melanie Stout is a 30 y.o. very pleasant female patient who presents with the following:  Generally healthy lady here today with complaint of CP for 2 weeks.  She had called the answering service over the weekend and was advised to go to the ER but she elected not to.   She notes that the pain started all of a sudden and has remained constant.   She notes the pain right over the sernum Nothing seems to make it better or worse, it is not exertional She is not having any SOB Never had any CP issues in the past She does not have any known cardiac concerns- none in her family either  She has never had a DVT or PE, no hormone therapy, no drug use.  No immobility, no calf pain or swelling She is taking chantix and hopes to quit smoking- she is still smoking a bit but has cut back.    She did start taking asa daily as a precatuion since she started the chest pains   There are no active problems to display for this patient.   Past Medical History  Diagnosis Date  . Hx of varicella   . Abnormal Pap smear   . Headache(784.0)   . PIH (pregnancy induced hypertension)     Past Surgical History  Procedure Laterality Date  . No past surgeries      Social History  Substance Use Topics  . Smoking status: Current Every Day Smoker -- 0.50 packs/day  . Smokeless tobacco: Not on file  . Alcohol Use: No    Family History  Problem Relation Age of Onset  . Hypertension Mother   . Parkinson's disease Father   . Diabetes Maternal Aunt     No Known Allergies  Medication list has been reviewed and updated.  Current Outpatient Prescriptions on File Prior to  Visit  Medication Sig Dispense Refill  . ferrous sulfate 325 (65 FE) MG tablet Take 1 tablet (325 mg total) by mouth 3 (three) times daily with meals. (Patient not taking: Reported on 07/31/2015) 90 tablet 3  . ibuprofen (ADVIL,MOTRIN) 800 MG tablet Take 1 tablet (800 mg total) by mouth every 8 (eight) hours as needed. (Patient not taking: Reported on 07/31/2015) 30 tablet 1  . oxyCODONE-acetaminophen (PERCOCET/ROXICET) 5-325 MG per tablet Take 1-2 tablets by mouth every 6 (six) hours as needed. (Patient not taking: Reported on 07/31/2015) 30 tablet 0  . Prenatal Vit-Fe Fumarate-FA (PRENATAL MULTIVITAMIN) TABS Take 1 tablet by mouth daily. Reported on 07/31/2015     No current facility-administered medications on file prior to visit.    Review of Systems:  As per HPI- otherwise negative.   Physical Examination: Filed Vitals:   07/31/15 1141  BP: 120/84  Pulse: 84  Temp: 98.3 F (36.8 C)   Filed Vitals:   07/31/15 1141  Height:  (1.676 m)  Weight: 248 lb 12.8 oz (112.855 kg)   Body mass index is 40.18 kg/(m^2). Ideal Body Weight: Weight in (lb) to have BMI = 25: 154.6  GEN: WDWN, NAD, Non-toxic, A & O x 3, obese, looks well HEENT: Atraumatic, Normocephalic.  Neck supple. No masses, No LAD. Ears and Nose: No external deformity. CV: RRR, No M/G/R. No JVD. No thrill. No extra heart sounds. I am able to reproduce her pain by pressing on the sternum/ costochondral joints.  She confirms that this is the pain she has noticed at home PULM: CTA B, no wheezes, crackles, rhonchi. No retractions. No resp. distress. No accessory muscle use. ABD: S, NT, ND EXTR: No c/c/e.  No calf swelling or tenderness NEURO Normal gait.  PSYCH: Normally interactive. Conversant. Not depressed or anxious appearing.  Calm demeanor.   EKG: NSR  Dg Chest 2 View  07/31/2015  CLINICAL DATA:  Chest pain. EXAM: CHEST  2 VIEW COMPARISON:  None. FINDINGS: The heart size and mediastinal contours are within normal  limits. Both lungs are clear. Minimal thoracolumbar scoliosis. IMPRESSION: No significant abnormality. Electronically Signed   By: Francene Boyers M.D.   On: 07/31/2015 12:58     Assessment and Plan: Chest pain, unspecified chest pain type - Plan: EKG 12-Lead, DG Chest 2 View, CBC, Troponin I  Here today with chest pain for 2 weeks- likely MSK in origin EKG is reassuring and I am able to reproduce her CP by pressing on her chest wall Discussed D dimer- risk of false positive.  She prefers to forgo this testing today but we will do a troponin.  Assuming this and her CXR are normal will treat for MSK pain  Results for orders placed or performed in visit on 07/31/15  Troponin I  Result Value Ref Range   TNIDX 0.00 0.00 - 0.06 ug/l   .Dg Chest 2 View  07/31/2015  CLINICAL DATA:  Chest pain. EXAM: CHEST  2 VIEW COMPARISON:  None. FINDINGS: The heart size and mediastinal contours are within normal limits. Both lungs are clear. Minimal thoracolumbar scoliosis. IMPRESSION: No significant abnormality. Electronically Signed   By: Francene Boyers M.D.   On: 07/31/2015 12:58   Called pt and went over her labs and CXR.  She will use the ibuprofen and let me know if not better.  May dc aspirin  Noted that her LMP was 6 weeks ago- called her back.  She reports that this is not unusual for her but she does agree to take a home HCG test to make sure she is not pregnant prior to starting ibuprofen.  I also asked lab to add on an HCG to the blood we went earlier if possible  Signed Abbe Amsterdam, MD

## 2015-07-31 NOTE — Patient Instructions (Signed)
We will send you for a chest x-ray and check a couple of labs for you today Assuming these all look ok I think that you have musculoskeletal chest pain-  This is not dangerous but can make on nervous!   I will give you a call later today with your results If all ok will have you use ibuprofen 400 mg three times a day for 5 days  Good luck with quitting smoking- keep it up!

## 2015-08-01 ENCOUNTER — Other Ambulatory Visit (INDEPENDENT_AMBULATORY_CARE_PROVIDER_SITE_OTHER): Payer: 59

## 2015-08-01 DIAGNOSIS — N926 Irregular menstruation, unspecified: Secondary | ICD-10-CM

## 2015-08-01 DIAGNOSIS — N910 Primary amenorrhea: Secondary | ICD-10-CM

## 2015-08-01 LAB — HCG, QUANTITATIVE, PREGNANCY: Quantitative HCG: 0.29 m[IU]/mL

## 2015-08-02 ENCOUNTER — Encounter: Payer: Self-pay | Admitting: Family Medicine

## 2015-08-08 ENCOUNTER — Telehealth: Payer: Self-pay

## 2015-08-08 NOTE — Telephone Encounter (Signed)
Left message for patient to return call to update chart information for appointment on tomorrow.

## 2015-08-09 ENCOUNTER — Telehealth: Payer: Self-pay | Admitting: Family Medicine

## 2015-08-09 ENCOUNTER — Ambulatory Visit: Payer: 59 | Admitting: Family Medicine

## 2015-08-10 NOTE — Telephone Encounter (Signed)
Pt was no show 08/09/15 4:15pm new pt appt, pt has not rescheduled, pt came in for acute appt with you 07/31/15, charge or no charge?

## 2015-08-14 NOTE — Telephone Encounter (Signed)
-----   Message from Pearline CablesJessica C Copland, MD sent at 08/10/2015  6:36 PM EST ----- No charge this time

## 2015-08-14 NOTE — Telephone Encounter (Signed)
Waiving fee, mailing letter

## 2015-12-13 ENCOUNTER — Encounter: Payer: Self-pay | Admitting: Family Medicine

## 2015-12-13 ENCOUNTER — Ambulatory Visit (INDEPENDENT_AMBULATORY_CARE_PROVIDER_SITE_OTHER): Payer: Managed Care, Other (non HMO) | Admitting: Family Medicine

## 2015-12-13 VITALS — BP 122/88 | HR 82 | Temp 98.4°F | Ht 66.0 in | Wt 241.4 lb

## 2015-12-13 DIAGNOSIS — N926 Irregular menstruation, unspecified: Secondary | ICD-10-CM

## 2015-12-13 DIAGNOSIS — K133 Hairy leukoplakia: Secondary | ICD-10-CM | POA: Diagnosis not present

## 2015-12-13 LAB — COMPLETE METABOLIC PANEL WITH GFR
ALBUMIN: 3.9 g/dL (ref 3.6–5.1)
ALT: 15 U/L (ref 6–29)
AST: 16 U/L (ref 10–30)
Alkaline Phosphatase: 63 U/L (ref 33–115)
BILIRUBIN TOTAL: 0.3 mg/dL (ref 0.2–1.2)
BUN: 9 mg/dL (ref 7–25)
CHLORIDE: 107 mmol/L (ref 98–110)
CO2: 22 mmol/L (ref 20–31)
Calcium: 8.7 mg/dL (ref 8.6–10.2)
Creat: 0.7 mg/dL (ref 0.50–1.10)
GFR, Est African American: >89 mL/min (ref >=60)
GLUCOSE: 90 mg/dL (ref 65–99)
POTASSIUM: 4.4 mmol/L (ref 3.5–5.3)
SODIUM: 138 mmol/L (ref 135–146)
Total Protein: 6.2 g/dL (ref 6.1–8.1)

## 2015-12-13 LAB — POCT URINE PREGNANCY: Preg Test, Ur: NEGATIVE

## 2015-12-13 MED ORDER — FLUCONAZOLE 100 MG PO TABS: 100.0000 mg | ORAL_TABLET | Freq: Every day | ORAL | Status: DC

## 2015-12-13 NOTE — Patient Instructions (Addendum)
You appear to have what is called "hairy tongue."  This is not dangerous but can certainly be annoying! We will check labs today to make sure there is no other abnormality.  However, the more important thing you can do is stop smoking!   Smoking is a very common cause of this condition You should also brush your tongue twice a day with a soft toothbrush or use a tongue scraper which is available OTC. Also, we will have you use diflucan daily for one week for any element of yeast   I wil be in touch with your labs

## 2015-12-13 NOTE — Progress Notes (Signed)
Pre visit review using our clinic review tool, if applicable. No additional management support is needed unless otherwise documented below in the visit note. 

## 2015-12-13 NOTE — Progress Notes (Signed)
[facility] [address] [place], [place] [address] [phone] [phone]  Date:  12/13/2015   Name:  Melanie Stout   DOB:  [birth year]   MRN:  [id number]  PCP:  Abbe AmsterdamOPLAND,Wilkins Elpers, MD    Chief Complaint: Tongue discoloration   History of Present Illness:  Melanie Stout is a 30 y.o. very pleasant female patient who presents with the following:  Generally healthy young lady here today with complaint of her tongue appearing yellow.  She hast noted this for about a week.  Her tongue feels normal, it does not hurt. She feels like her skin is dry but not yellow. Has not noted any sign of janudice No change in her stool or urine color She is not following any unusual diet or taking any new supplements.   No recent abx She has noted dry patches on her skin recently that will be flaky- she has to apply moisturizer all the time   LMP about 2 months ago- she always has irregular menses and is SA but does not suspect pregnancy   She is a smoker  There are no active problems to display for this patient.   Past Medical History  Diagnosis Date  . Hx of varicella   . Abnormal Pap smear   . Headache(784.0)   . PIH (pregnancy induced hypertension)     Past Surgical History  Procedure Laterality Date  . No past surgeries      Social History  Substance Use Topics  . Smoking status: Current Every Day Smoker -- 0.50 packs/day  . Smokeless tobacco: None  . Alcohol Use: No    Family History  Problem Relation Age of Onset  . Hypertension Mother   . Parkinson's disease Father   . Diabetes Maternal Aunt     No Known Allergies  Medication list has been reviewed and updated.  No current outpatient prescriptions on file prior to visit.   No current facility-administered medications on file prior to visit.    Review of Systems:  As per HPI- otherwise negative. She is otherwise feeling well- normal  appetite, no abd pain  Physical Examination: Filed Vitals:   12/13/15 1026  BP: 122/88  Pulse: 82  Temp: 98.4 F (36.9 C)   Filed Vitals:   12/13/15 1026  Height: 5\' 6"  (1.676 m)  Weight: 241 lb 6.4 oz (109.498 kg)   Body mass index is 38.98 kg/(m^2). Ideal Body Weight: Weight in (lb) to have BMI = 25: 154.6  GEN: WDWN, NAD, Non-toxic, A & O x 3, obese, looks well HEENT: Atraumatic, Normocephalic. Neck supple. No masses, No LAD.  Bilateral TM wnl, oropharynx normal.  PEERL,EOMI.   No scleral icterus Her dorsal tongue is coated and appears consistent with hairy tongue  Ears and Nose: No external deformity. CV: RRR, No M/G/R. No JVD. No thrill. No extra heart sounds. PULM: CTA B, no wheezes, crackles, rhonchi. No retractions. No resp. distress. No accessory muscle use. ABD: S, NT, ND. No rebound. No HSM. Benign belly EXTR: No c/c/e NEURO Normal gait.  PSYCH: Normally interactive. Conversant. Not depressed or anxious appearing.  Calm demeanor.   Results for orders placed or performed in visit on 12/13/15  POCT urine pregnancy  Result Value Ref Range   Preg Test, Ur Negative Negative    Assessment and Plan: Hairy leukoplakia of tongue - Plan: fluconazole (DIFLUCAN) 100 MG tablet, COMPLETE METABOLIC PANEL WITH GFR  Irregular menses -  Plan: POCT urine pregnancy  HCG negative today Discussed management of hairy tongue; will use a week of diflucan for any yeast element, and encouraged smoking cessation, tongue hygiene  See patient instructions for more details.     Signed Abbe Amsterdam, MD

## 2016-01-10 ENCOUNTER — Encounter: Payer: Self-pay | Admitting: Medical

## 2016-01-10 ENCOUNTER — Ambulatory Visit (INDEPENDENT_AMBULATORY_CARE_PROVIDER_SITE_OTHER): Payer: Managed Care, Other (non HMO) | Admitting: Medical

## 2016-01-10 VITALS — BP 115/80 | HR 73 | Temp 98.3°F | Ht 66.0 in | Wt 203.5 lb

## 2016-01-10 DIAGNOSIS — N912 Amenorrhea, unspecified: Secondary | ICD-10-CM

## 2016-01-10 DIAGNOSIS — N926 Irregular menstruation, unspecified: Secondary | ICD-10-CM

## 2016-01-10 LAB — POCT URINE PREGNANCY: PREG TEST UR: NEGATIVE

## 2016-01-10 MED ORDER — SULFAMETHOXAZOLE-TRIMETHOPRIM 800-160 MG PO TABS: 1.0000 | ORAL_TABLET | Freq: Two times a day (BID) | ORAL | 0 refills | Status: DC

## 2016-01-10 MED ORDER — NYSTATIN 100000 UNIT/ML MT SUSP: 5.0000 mL | Freq: Four times a day (QID) | OROMUCOSAL | 0 refills | Status: DC

## 2016-01-10 NOTE — Patient Instructions (Addendum)
For your possible fungal infection on tongue/mouth rx nystatin suspension.  For cellulitis of left axillary area I want you to start bactrim DS. Your preg test today was negative. During next next week while on antibiotic recommend use of condom if any intercourse.  Apply warm compress to area twice daily.  Follow up this Friday for recheck of left axillary area. Please ask staff at check out to give you 30 minute appointment in event I and D needed.  Follow up 2 days or as needed

## 2016-01-10 NOTE — Progress Notes (Signed)
Pre visit review using our clinic tool,if applicable. No additional management support is needed unless otherwise documented below in the visit note.  

## 2016-01-10 NOTE — Progress Notes (Signed)
Subjective:    Patient ID: Melanie Stout, female    DOB: [birth year], 30 y.o.   MRN: [id number]  HPI  Pt in for tongue discoloration. Dr. Dallas Schimke gave her diflucan tablet on last visit. She was given diflucan but took only  4 days of med(pt indicated script was for more days) . But she states mid way through her face seemed to itch on face and was crawling(no sob or wheezing). Pt states her tongue looks better but not completely better. Since she saw Dr. Patsy Lager saw gyn and hiv test done. It was negative.   Pt has swollen area under her left arm for 3-4 days. Pt states about year ago the area swelled and was tender she took antibiotic and area flattened and was not tender for about a year. Then recent reoccurance. No fevers. No chills or sweats.   LMP- About 3 months ago. She has irregular menses. Pt has no recent pregnancy but one 2 weeks ago. She indicates does not want to repeat today.     Review of Systems  Constitutional: Negative for chills, fatigue and fever.  HENT:       Faint white film on tongue.  Respiratory: Negative for cough, chest tightness and wheezing.   Cardiovascular: Negative for chest pain and palpitations.  Gastrointestinal: Negative for abdominal pain.  Musculoskeletal: Negative for back pain and gait problem.  Skin: Negative for rash.       Left axillary tender area.  Psychiatric/Behavioral: Negative for behavioral problems and confusion.     Past Medical History:  Diagnosis Date  . Abnormal Pap smear   . Headache(784.0)   . Hx of varicella   . PIH (pregnancy induced hypertension)      Social History   Social History  . Marital status: Single    Spouse name: N/A  . Number of children: N/A  . Years of education: N/A   Occupational History  . Not on file.   Social History Main Topics  . Smoking status: Current Every Day Smoker    Packs/day: 0.50  . Smokeless tobacco: Not on file  . Alcohol use No  . Drug use: No  . Sexual  activity: Yes    Birth control/ protection: None   Other Topics Concern  . Not on file   Social History Narrative   ** Merged History Encounter **        Past Surgical History:  Procedure Laterality Date  . NO PAST SURGERIES      Family History  Problem Relation Age of Onset  . Hypertension Mother   . Parkinson's disease Father   . Diabetes Maternal Aunt     Allergies  Allergen Reactions  . Fluconazole Other (See Comments)    Feels like skin on face was crawling     Current Outpatient Prescriptions on File Prior to Visit  Medication Sig Dispense Refill  . fluconazole (DIFLUCAN) 100 MG tablet Take 1 tablet (100 mg total) by mouth daily. (Patient not taking: Reported on 01/10/2016) 7 tablet 0   No current facility-administered medications on file prior to visit.     BP 115/80   Pulse 73   Temp 98.3 F (36.8 C) (Oral)   Ht  (1.676 m)   Wt 203 lb 8 oz (92.3 kg)   LMP 10/10/2015   SpO2 98%   BMI 32.85 kg/m       Objective:   Physical Exam  General- No acute distress. Pleasant patient. Neck- Full range  of motion, no jvd Lungs- Clear, even and unlabored. Heart- regular rate and rhythm. Neurologic- CNII- XII grossly intact. Mouth- faint whitish film to tongue. Lt axillary area- indurated area 2 cm x 2 cm and mild elevated but  No fluctuance. Faint warmth. No dc.Mild tender.      Assessment & Plan:  For your possible fungal infection on tongue/mouth rx nystatin suspension.  For cellulitis of left axillary area I want you to start bactrim DS. Your preg test today was negative. During next next week while on antibiotic recommend use of condom if any intercourse.  Apply warm compress to area twice daily.  Follow up this Friday for recheck of left axillary area. Please ask staff at check out to give you 30 minute appointment in event I and D needed.(Note presently I and D does not appear needed but would likely just result in bleeding wound)  Follow up in  2 days or as needed

## 2016-06-05 ENCOUNTER — Ambulatory Visit (INDEPENDENT_AMBULATORY_CARE_PROVIDER_SITE_OTHER): Payer: Managed Care, Other (non HMO) | Admitting: Family Medicine

## 2016-06-05 ENCOUNTER — Encounter: Payer: Self-pay | Admitting: Family Medicine

## 2016-06-05 VITALS — BP 124/78 | HR 70 | Temp 98.3°F | Ht 66.0 in | Wt 220.8 lb

## 2016-06-05 DIAGNOSIS — F17200 Nicotine dependence, unspecified, uncomplicated: Secondary | ICD-10-CM

## 2016-06-05 DIAGNOSIS — J309 Allergic rhinitis, unspecified: Secondary | ICD-10-CM | POA: Diagnosis not present

## 2016-06-05 MED ORDER — PREDNISONE 20 MG PO TABS: ORAL_TABLET | ORAL | 0 refills | Status: DC

## 2016-06-05 NOTE — Progress Notes (Signed)
Pre visit review using our clinic review tool, if applicable. No additional management support is needed unless otherwise documented below in the visit note. 

## 2016-06-05 NOTE — Progress Notes (Signed)
[facility] [address] [place], [place] [address] [phone] [phone]  Date:  06/05/2016   Name:  Melanie ColonelWhitney Shonta Yonan   DOB:  [birth year]   MRN:  [id number]  PCP:  Abbe AmsterdamOPLAND,Garyson Stelly, MD    Chief Complaint: Sinus Problem (c/o sneezing, runny nose,throat irritation, prod cough with yellow mucus x 3-5 days ago. )   History of Present Illness:  Melanie Stout is a 31 y.o. very pleasant female patient who presents with the following:  Here today with illness- she notes a history of sinus infection "twice a year, normally I would see my ENT doctor but he did not have any appointments for a long time"  She has noted cough, sneezing, nasal pain and PND- for 4-5 days No fever She found "one pill that I had left over from my ENT- it started with a P- I took it and I felt much better."  We think this was a prednisone pill She has tried cough syrup and cough drops, claritin, other OTC meds but sx persist   LMP about one month ago- she feels certain that she is not pregnant as no recent sexual activity  She is a smoker.  Has a 343 yo son There are no active problems to display for this patient.   Past Medical History:  Diagnosis Date  . Abnormal Pap smear   . Headache(784.0)   . Hx of varicella   . PIH (pregnancy induced hypertension)     Past Surgical History:  Procedure Laterality Date  . NO PAST SURGERIES      Social History  Substance Use Topics  . Smoking status: Current Every Day Smoker    Packs/day: 0.50  . Smokeless tobacco: Not on file  . Alcohol use No    Family History  Problem Relation Age of Onset  . Hypertension Mother   . Parkinson's disease Father   . Diabetes Maternal Aunt     Allergies  Allergen Reactions  . Fluconazole Other (See Comments)    Feels like skin on face was crawling     Medication list has been reviewed and updated.  No current outpatient prescriptions on file  prior to visit.   No current facility-administered medications on file prior to visit.     Review of Systems:  As per HPI- otherwise negative.   Physical Examination: Vitals:   06/05/16 1539  BP: 124/78  Pulse: 99  Temp: 98.3 F (36.8 C)   Vitals:   06/05/16 1539  Weight: 220 lb 12.8 oz (100.2 kg)  Height: 5\' 6"  (1.676 m)   Body mass index is 35.64 kg/m. Ideal Body Weight: Weight in (lb) to have BMI = 25: 154.6  GEN: WDWN, NAD, Non-toxic, A & O x 3, obese, looks well HEENT: Atraumatic, Normocephalic. Neck supple. No masses, No LAD.  Bilateral TM wnl, oropharynx normal.  PEERL,EOMI.   Ears and Nose: No external deformity. CV: RRR, No M/G/R. No JVD. No thrill. No extra heart sounds. PULM: CTA B, no wheezes, crackles, rhonchi. No retractions. No resp. distress. No accessory muscle use. EXTR: No c/c/e NEURO Normal gait.  PSYCH: Normally interactive. Conversant. Not depressed or anxious appearing.  Calm demeanor.  Nasal cavity is quite inflamed and turbinates are swollen   Assessment and Plan: Acute allergic rhinitis, unspecified seasonality, unspecified trigger - Plan: predniSONE (DELTASONE) 20 MG tablet  Here today with severely symptomatic allergic rhinitis.  She often uses prednisone for this  and is miserable.  Will treat with prednisone- no abx at this time She will alert me if not feeling better soon- Sooner if worse.    Signed Abbe Amsterdam, MD

## 2016-06-05 NOTE — Patient Instructions (Signed)
I sent an rx for prednisone to your drug store.  You can take this for 10 days- ok to combine with claritin and tylenol. However avoid NSAID medications such as ibuprofen or aleve while you are on prednisone.  I expect that you will feel much better within the next few days but please let me know if you do not improve soon!

## 2016-07-23 ENCOUNTER — Encounter (HOSPITAL_BASED_OUTPATIENT_CLINIC_OR_DEPARTMENT_OTHER): Payer: Self-pay

## 2016-07-23 ENCOUNTER — Emergency Department (HOSPITAL_BASED_OUTPATIENT_CLINIC_OR_DEPARTMENT_OTHER)
Admission: EM | Admit: 2016-07-23 | Discharge: 2016-07-23 | Disposition: A | Discharge status: Home / Self Care | Payer: 59 | Attending: Emergency Medicine | Admitting: Emergency Medicine

## 2016-07-23 ENCOUNTER — Telehealth: Payer: Self-pay | Admitting: Family Medicine

## 2016-07-23 DIAGNOSIS — Z79899 Other long term (current) drug therapy: Secondary | ICD-10-CM | POA: Diagnosis not present

## 2016-07-23 DIAGNOSIS — F17200 Nicotine dependence, unspecified, uncomplicated: Secondary | ICD-10-CM | POA: Diagnosis not present

## 2016-07-23 DIAGNOSIS — R61 Generalized hyperhidrosis: Secondary | ICD-10-CM | POA: Diagnosis not present

## 2016-07-23 DIAGNOSIS — R079 Chest pain, unspecified: Secondary | ICD-10-CM | POA: Diagnosis present

## 2016-07-23 DIAGNOSIS — R110 Nausea: Secondary | ICD-10-CM | POA: Diagnosis not present

## 2016-07-23 LAB — COMPREHENSIVE METABOLIC PANEL
ALK PHOS: 58 U/L (ref 38–126)
ALT: 16 U/L (ref 14–54)
ANION GAP: 4 — AB (ref 5–15)
AST: 20 U/L (ref 15–41)
Albumin: 3.8 g/dL (ref 3.5–5.0)
BUN: 15 mg/dL (ref 6–20)
CALCIUM: 8.4 mg/dL — AB (ref 8.9–10.3)
CO2: 26 mmol/L (ref 22–32)
Chloride: 106 mmol/L (ref 101–111)
Creatinine, Ser: 0.72 mg/dL (ref 0.44–1.00)
GFR calc non Af Amer: >60 mL/min (ref >60)
Glucose, Bld: 75 mg/dL (ref 65–99)
Potassium: 3.9 mmol/L (ref 3.5–5.1)
SODIUM: 136 mmol/L (ref 135–145)
TOTAL PROTEIN: 6.7 g/dL (ref 6.5–8.1)
Total Bilirubin: 0.4 mg/dL (ref 0.3–1.2)

## 2016-07-23 LAB — TROPONIN I

## 2016-07-23 LAB — CBC
HCT: 39.2 % (ref 36.0–46.0)
Hemoglobin: 13.1 g/dL (ref 12.0–15.0)
MCH: 28.3 pg (ref 26.0–34.0)
MCHC: 33.4 g/dL (ref 30.0–36.0)
MCV: 84.7 fL (ref 78.0–100.0)
PLATELETS: 292 10*3/uL (ref 150–400)
RBC: 4.63 MIL/uL (ref 3.87–5.11)
RDW: 14 % (ref 11.5–15.5)
WBC: 7.7 10*3/uL (ref 4.0–10.5)

## 2016-07-23 MED ORDER — OXYCODONE-ACETAMINOPHEN 5-325 MG PO TABS
2.0000 | ORAL_TABLET | Freq: Once | ORAL | Status: AC
Start: 2016-07-23 — End: 2016-07-23
  Administered 2016-07-23: 2 via ORAL
  Filled 2016-07-23: qty 2

## 2016-07-23 MED ORDER — ONDANSETRON 4 MG PO TBDP
4.0000 mg | ORAL_TABLET | Freq: Once | ORAL | Status: AC
  Administered 2016-07-23: 4 mg via ORAL
  Filled 2016-07-23: qty 1

## 2016-07-23 MED ORDER — MELOXICAM 15 MG PO TABS: 15.0000 mg | ORAL_TABLET | Freq: Every day | ORAL | 0 refills | Status: DC

## 2016-07-23 MED ORDER — GI COCKTAIL ~~LOC~~
30.0000 mL | Freq: Once | ORAL | Status: AC
  Administered 2016-07-23: 30 mL via ORAL
  Filled 2016-07-23: qty 30

## 2016-07-23 NOTE — Telephone Encounter (Signed)
[facility] Day - Client TELEPHONE ADVICE RECORD [facility] Patient Name: Melanie GibsonWHITNEY CHAMBERL AIN DOB: 12-06-1985 Initial Comment caller states she has chest pain, dizziness, nausea and doesn't feel good Nurse Assessment Nurse: Lane HackerHarley, RN, Windy Date/Time (Eastern Time): 07/23/2016 2:09:21 PM Confirm and document reason for call. If symptomatic, describe symptoms. ---Caller states she has chest pain for last 3 days and worse in last 2 days. Last night it was much more painful. Took some ASA. And returned with CP today, dizziness, nausea. Does the patient have any new or worsening symptoms? ---Yes Will a triage be completed? ---Yes Related visit to physician within the last 2 weeks? ---No Does the PT have any chronic conditions? (i.e. diabetes, asthma, etc.) ---No Is the patient pregnant or possibly pregnant? (Ask all females between the ages of 8112-55) ---No Is this a behavioral health or substance abuse call? ---No Guidelines Guideline Title Affirmed Question Affirmed Notes Chest Pain [1] Chest pain lasts > 5 minutes AND [2] described as crushing, pressure-like, or heavy Final Disposition User Call EMS 911 Now Lane HackerHarley, RN, WUJWJWindy Disagree/Comply: Disagree Disagree/Comply Reason: Disagree with instructionsPt understands advice but plans to drive herself to the ER. RN tried to reinforce that someone else drive at least. Caller verbalized understanding

## 2016-07-23 NOTE — ED Provider Notes (Signed)
[facility]-EMERGENCY DEPT Provider Note   CSN: [id number] Arrival date & time: 07/23/16  1458   By signing my name below, I, Melanie Stout, attest that this documentation has been prepared under the direction and in the presence of Melanie CaptainAbigail Duron Meister, PA-C . Electronically Signed: Freida Busmaniana Stout, Scribe. 07/23/2016. 5:59 PM.   History   Chief Complaint Chief Complaint  Patient presents with  . Chest Pain    The history is provided by the patient. No language interpreter was used.     HPI Comments:  Melanie Stout is a 31 y.o. female who presents to the Emergency Department complaining of gradually worsening, gradual onset CP x 3 days. She states her pain worsened last night and she took ASA with moderate relief. She had another episode of moderate- severe pain this afternoon that lasted ~ 2 hours with assocoiated nausea and diaphoresis. She chewed another ASA today with moderate relief but  states episode last night was worse. She rates her pain a 7/10 at this time.  Pt is an occasional smoker now but was a 1-1.5 ppd smoker for 8 years. She denies illicit drug use. No h/o HTN, DM., PE/DVT. No recent illness; no fever, chills, cough, congestion.  No lower extremity swelling or pain. No family h/o MI.    Past Medical History:  Diagnosis Date  . Abnormal Pap smear   . Headache(784.0)   . Hx of varicella   . PIH (pregnancy induced hypertension)     Patient Active Problem List   Diagnosis Date Noted  . Tobacco use disorder 06/05/2016    Past Surgical History:  Procedure Laterality Date  . NO PAST SURGERIES    . NOSE SURGERY      OB History    Gravida Para Term Preterm AB Living   2 2 1  0 0 1   SAB TAB Ectopic Multiple Live Births   0 0 0   1       Home Medications    Prior to Admission medications   Medication Sig Start Date End Date Taking? Authorizing Provider  Amoxicillin (AMOXIL PO) Take by mouth.   Yes Historical Provider, MD  OXYCODONE-ACETAMINOPHEN PO Take  by mouth.   Yes Historical Provider, MD  Varenicline Tartrate (CHANTIX PO) Take by mouth.   Yes Historical Provider, MD  meloxicam (MOBIC) 15 MG tablet Take 1 tablet (15 mg total) by mouth daily. Take 1 daily with food. 07/23/16   Melanie CaptainAbigail Hatem Cull, PA-C    Family History Family History  Problem Relation Age of Onset  . Hypertension Mother   . Parkinson's disease Father   . Diabetes Maternal Aunt     Social History Social History  Substance Use Topics  . Smoking status: Current Every Day Smoker    Packs/day: 0.50  . Smokeless tobacco: Never Used  . Alcohol use Yes     Comment: occ     Allergies   Fluconazole   Review of Systems Review of Systems  Constitutional: Positive for diaphoresis. Negative for chills and fever.  Respiratory: Negative for shortness of breath.   Cardiovascular: Positive for chest pain.  Gastrointestinal: Positive for nausea. Negative for vomiting.  All other systems reviewed and are negative.    Physical Exam Updated Vital Signs BP 137/92 (BP Location: Right Arm)   Pulse 63   Temp 98.2 F (36.8 C) (Oral)   Resp 18   Ht 5\' 6"  (1.676 m)   Wt 108.9 kg   LMP 07/16/2016   SpO2 100%  BMI 38.74 kg/m   Physical Exam  Constitutional: She is oriented to person, place, and time. She appears well-developed and well-nourished. No distress.  HENT:  Head: Normocephalic and atraumatic.  Eyes: Conjunctivae are normal. No scleral icterus.  Neck: Normal range of motion.  Cardiovascular: Normal rate, regular rhythm, normal heart sounds and intact distal pulses.  Exam reveals no gallop and no friction rub.   No murmur heard. Pulmonary/Chest: Effort normal and breath sounds normal. No respiratory distress.  Abdominal: Soft. Bowel sounds are normal. She exhibits no distension and no mass. There is no tenderness. There is no guarding.  Neurological: She is alert and oriented to person, place, and time.  Skin: Skin is warm and dry. She is not diaphoretic.    Psychiatric: She has a normal mood and affect.  Nursing note and vitals reviewed.    ED Treatments / Results  DIAGNOSTIC STUDIES:  Oxygen Saturation is 100% on RA, normal by my interpretation.    COORDINATION OF CARE:  5:59 PM Discussed treatment plan with pt at bedside and pt agreed to plan.  Labs (all labs ordered are listed, but only abnormal results are displayed) Labs Reviewed  COMPREHENSIVE METABOLIC PANEL - Abnormal; Notable for the following:       Result Value   Calcium 8.4 (*)    Anion gap 4 (*)    All other components within normal limits  CBC  TROPONIN I  TROPONIN I    EKG  EKG Interpretation  Date/Time:  Tuesday July 23 2016 15:15:16 EST Ventricular Rate:  67 PR Interval:  124 QRS Duration: 84 QT Interval:  390 QTC Calculation: 412 R Axis:   61 Text Interpretation:  Normal sinus rhythm with sinus arrhythmia Right atrial enlargement Borderline ECG No old tracing to compare Confirmed by KNAPP  MD-J, JON ([id number]) on 07/23/2016 6:34:43 PM       Radiology No results found.  Procedures Procedures (including critical care time)  Medications Ordered in ED Medications  gi cocktail (Maalox,Lidocaine,Donnatal) (30 mLs Oral Given 07/23/16 1845)  oxyCODONE-acetaminophen (PERCOCET/ROXICET) 5-325 MG per tablet 2 tablet (2 tablets Oral Given 07/23/16 2100)  ondansetron (ZOFRAN-ODT) disintegrating tablet 4 mg (4 mg Oral Given 07/23/16 2100)     Initial Impression / Assessment and Plan / ED Course  I have reviewed the triage vital signs and the nursing notes.  Pertinent labs & imaging results that were available during my care of the patient were reviewed by me and considered in my medical decision making (see chart for details).     Patient heart score of 2 making her low risk for MACE. She is perc negative. Pain has been constant for 3 days. 2 negative troponins. Patient will be discharged to follow up with her pcp. .  The patient appears reasonably  screened and/or stabilized for discharge and I doubt any other emergent medical condition requiring further screening, evaluation, or treatment in the ED at this time prior to discharge.    Final Clinical Impressions(s) / ED Diagnoses   Final diagnoses:  Nonspecific chest pain    New Prescriptions Discharge Medication List as of 07/23/2016  8:52 PM    START taking these medications   Details  meloxicam (MOBIC) 15 MG tablet Take 1 tablet (15 mg total) by mouth daily. Take 1 daily with food., Starting Tue 07/23/2016, Print      I personally performed the services described in this documentation, which was scribed in my presence. The recorded information has been reviewed and is  accurate.       Melanie Captain, PA-C 07/31/16 1221    Linwood Dibbles, MD 08/01/16 [id number]

## 2016-07-23 NOTE — Discharge Instructions (Signed)

## 2016-07-23 NOTE — ED Triage Notes (Signed)
C/o CP x 3 days-NAD-presents to triage in w/c-steady when stood from w/c to stretcher chair in triage

## 2016-07-23 NOTE — ED Notes (Signed)
ED Provider at bedside. 

## 2016-08-01 ENCOUNTER — Ambulatory Visit: Payer: 59 | Admitting: Family Medicine

## 2016-08-01 ENCOUNTER — Ambulatory Visit (INDEPENDENT_AMBULATORY_CARE_PROVIDER_SITE_OTHER): Payer: 59 | Admitting: Family Medicine

## 2016-08-01 ENCOUNTER — Encounter: Payer: Self-pay | Admitting: Family Medicine

## 2016-08-01 VITALS — BP 139/78 | HR 65 | Temp 98.2°F | Ht 66.0 in | Wt 226.6 lb

## 2016-08-01 DIAGNOSIS — R079 Chest pain, unspecified: Secondary | ICD-10-CM | POA: Diagnosis not present

## 2016-08-01 DIAGNOSIS — R42 Dizziness and giddiness: Secondary | ICD-10-CM | POA: Diagnosis not present

## 2016-08-01 DIAGNOSIS — Z87891 Personal history of nicotine dependence: Secondary | ICD-10-CM

## 2016-08-01 DIAGNOSIS — E669 Obesity, unspecified: Secondary | ICD-10-CM

## 2016-08-01 NOTE — Progress Notes (Signed)
[facility] [address], [address] [place], [place] [address] [phone] [phone]  Date:  08/01/2016   Name:  Melanie Stout   DOB:  1986/03/29   MRN:  [id number]  PCP:  Abbe AmsterdamOPLAND,JESSICA, MD    Chief Complaint: Hospitalization Follow-up (Pt here for ER f/u. Seen in the ER 2/20. Pt has not had the chest pain since ER. Pt did quit smoking on 2/20. )   History of Present Illness:  Athziry Erskine SpeedShonta Stout is a 31 y.o. very pleasant female patient who presents with the following:  Here today to follow-up on a recent ED visit, on 07-23-2016.  HPI from this visit:  Melanie Erskine SpeedShonta Pesci is a 31 y.o. female who presents to the Emergency Department complaining of gradually worsening, gradual onset CP x 3 days. She states her pain worsened last night and she took ASA with moderate relief. She had another episode of moderate- severe pain this afternoon that lasted ~ 2 hours with assocoiated nausea and diaphoresis. She chewed another ASA today with moderate relief but  states episode last night was worse. She rates her pain a 7/10 at this time.  Pt is an occasional smoker now but was a 1-1.5 ppd smoker for 8 years. She denies illicit drug use. No h/o HTN, DM., PE/DVT. No recent illness; no fever, chills, cough, congestion.  No lower extremity swelling or pain. No family h/o MI.   Plan from ED visit:  Medications Ordered in ED  - gi cocktail (Maalox,Lidocaine,Donnatal) (30 mLs Oral Given 07/23/16 1845) - oxyCODONE-acetaminophen (PERCOCET/ROXICET) 5-325 MG per tablet 2 tablet (2 tablets Oral Given 07/23/16 2100) - ondansetron (ZOFRAN-ODT) disintegrating tablet 4 mg (4 mg Oral Given 07/23/16 2100)  Patient heart score of 2 making her low risk for MACE. She is perc negative. Pain has been constant for 3 days. 2 negative troponins. Patient will be discharged to follow up with her pcp. .  The patient appears reasonably screened and/or  stabilized for discharge and I doubt any other emergent medical condition requiring further screening, evaluation, or treatment in the ED at this time prior to discharge.   HPI for today's visit:   Pt states she went to the ER with CP for 3 days- it felt like a pressure on her chest. She also felt dizzy.  She finally went to the ER and was evaluated, released  The chest pains are resolved.  However she has still felt a bit dizzy at times- She describes mild vertigo sx that are not related to position change or movement of her head.  This is steadily improving and is nearly resolved at this time No nausea, vomiting, fever, chills, belly pain  Also, she notes that her face has started to "break out some," she saw dermatology yesterday about this problem  No known history of HTN She did quit smoking after her ER visit which is great  BP Readings from Last 3 Encounters:  08/01/16 139/78  07/23/16 137/92  06/05/16 124/78    Patient Active Problem List   Diagnosis Date Noted  . Tobacco use disorder 06/05/2016    Past Medical History:  Diagnosis Date  . Abnormal Pap smear   . Headache(784.0)   . Hx of varicella   . PIH (pregnancy induced hypertension)     Past Surgical History:  Procedure Laterality Date  . NO PAST SURGERIES    . NOSE SURGERY      Social History  Substance  Use Topics  . Smoking status: Former Smoker    Packs/day: 0.50    Quit date: 07/24/2016  . Smokeless tobacco: Never Used  . Alcohol use Yes     Comment: occ    Family History  Problem Relation Age of Onset  . Hypertension Mother   . Parkinson's disease Father   . Diabetes Maternal Aunt     Allergies  Allergen Reactions  . Fluconazole Other (See Comments)    Feels like skin on face was crawling     Medication list has been reviewed and updated.  No current outpatient prescriptions on file prior to visit.   No current facility-administered medications on file prior to visit.     Review of  Systems:  As per HPI- otherwise negative.   Physical Examination: Vitals:   08/01/16 1328 08/01/16 1402  BP: 139/78   Pulse: 65   Temp: (!) 100.6 F (38.1 C) 98.2 F (36.8 C)   Vitals:   08/01/16 1328  Weight: 226 lb 9.6 oz (102.8 kg)  Height: 5\' 6"  (1.676 m)   Body mass index is 36.57 kg/m. Ideal Body Weight: Weight in (lb) to have BMI = 25: 154.6 Temp taken after pt drank coffee- recheck, ok at 98.2  GEN: WDWN, NAD, Non-toxic, A & O x 3, obese, otherwise looks well HEENT: Atraumatic, Normocephalic. Neck supple. No masses, No LAD.  Bilateral TM wnl, oropharynx normal.  PEERL,EOMI.   Ears and Nose: No external deformity. CV: RRR, No M/G/R. No JVD. No thrill. No extra heart sounds. PULM: CTA B, no wheezes, crackles, rhonchi. No retractions. No resp. distress. No accessory muscle use. ABD: S, NT, ND. No rebound. No HSM. EXTR: No c/c/e NEURO Normal gait.   Normal strength, sensation, DTR of all extremities.  Normal romberg PSYCH: Normally interactive. Conversant. Not depressed or anxious appearing.  Calm demeanor.    Assessment and Plan:  Chest pain, unspecified type  Dizziness  Former smoker  Here today to follow-up from a recent ER visit Her CP are resolved and her ER evaluation was reassuring She still notes mild dizziness but feels that this is improving   At this time she prefers to continue to observe her sx as opposed to doing any additional testing which I agree is reasonable She will alert me if her sx do not continue to improve Congratulated her on quitting smoking   Signed Abbe Amsterdam, MD

## 2016-08-01 NOTE — Patient Instructions (Signed)
It was great to see you again today- please let me know if your symptoms do not continue to go away/ if you do not get back to normal Please seek care right away if you are getting worse or have any other concerns  congrats on quitting smoking!

## 2016-08-21 ENCOUNTER — Encounter: Payer: Self-pay | Admitting: Family Medicine

## 2016-08-21 NOTE — Progress Notes (Unsigned)
POCT LIPIDS: 148 POC HIGH DENSITY CHOLESTEROL: 48 TOTAL HDL-C: 3.1 POC TRIGLYCERIDES: 425

## 2017-03-24 ENCOUNTER — Encounter: Payer: Self-pay | Admitting: Family Medicine

## 2017-03-24 ENCOUNTER — Ambulatory Visit (INDEPENDENT_AMBULATORY_CARE_PROVIDER_SITE_OTHER): Payer: 59 | Admitting: Family Medicine

## 2017-03-24 VITALS — BP 108/78 | HR 79 | Temp 98.4°F | Ht 66.0 in | Wt 260.1 lb

## 2017-03-24 DIAGNOSIS — S46811A Strain of other muscles, fascia and tendons at shoulder and upper arm level, right arm, initial encounter: Secondary | ICD-10-CM | POA: Diagnosis not present

## 2017-03-24 MED ORDER — METHYLPREDNISOLONE 4 MG PO TBPK: ORAL_TABLET | ORAL | 0 refills | Status: DC

## 2017-03-24 MED ORDER — METHOCARBAMOL 500 MG PO TABS: 500.0000 mg | ORAL_TABLET | Freq: Four times a day (QID) | ORAL | 0 refills | Status: DC

## 2017-03-24 MED ORDER — KETOROLAC TROMETHAMINE 60 MG/2ML IM SOLN
60.0000 mg | Freq: Once | INTRAMUSCULAR | Status: AC
  Administered 2017-03-24: 60 mg via INTRAMUSCULAR

## 2017-03-24 NOTE — Progress Notes (Signed)
Musculoskeletal Exam  Patient: Melanie Stout DOB: 1985-12-18  DOS: 03/24/2017  SUBJECTIVE:  Chief Complaint:   Chief Complaint  Patient presents with  . Neck Pain  . Back Pain    Hendrix Erskine SpeedShonta Stout is a 31 y.o.  female for evaluation and treatment of neck/back pain.   Onset:  4 days ago.  Woke up with aching neck, has now spread.  Location: R neck Character:  aching and shooting/sharp with certain movements 9/10 severity  Progression of issue:  has worsened slightly Associated symptoms: Poor ROM Treatment: to date has been rest, OTC NSAIDS, acetaminophen and muscle relaxers.   Neurovascular symptoms: no  ROS: Musculoskeletal/Extremities: +R neck/back pain Neurologic: no numbness, tingling no weakness   Past Medical History:  Diagnosis Date  . Abnormal Pap smear   . Headache(784.0)   . Hx of varicella   . PIH (pregnancy induced hypertension)    Past Surgical History:  Procedure Laterality Date  . NO PAST SURGERIES    . NOSE SURGERY     Objective: VITAL SIGNS: BP 108/78 (BP Location: Left Arm, Patient Position: Sitting, Cuff Size: Large)   Pulse 79   Temp 98.4 F (36.9 C) (Oral)   Ht 5\' 6"  (1.676 m)   Wt 260 lb 2 oz (118 kg)   SpO2 96%   BMI 41.99 kg/m  Constitutional: Well formed, well developed. No acute distress. Cardiovascular: Brisk cap refill Thorax & Lungs: No accessory muscle use Extremities: No clubbing. No cyanosis. No edema.  Skin: Warm. Dry. No erythema. No rash.  Musculoskeletal: R neck.   Normal active range of motion: no.   Normal passive range of motion: no, decreased rotation 2/2 pain Tenderness to palpation: yes- over lateral neck, R trap Deformity: no Ecchymosis: no Tests positive: none Tests negative: Spurlings Strength equal in UE's b/l, poor effort Neurologic: Normal sensory function. No focal deficits noted. DTR's equal and symmetry in UE's. No clonus. Psychiatric: Normal mood. Age appropriate judgment and  insight. Alert & oriented x 3.    Assessment:  Strain of right trapezius muscle, initial encounter - Plan: methocarbamol (ROBAXIN) 500 MG tablet, methylPREDNISolone (MEDROL DOSEPAK) 4 MG TBPK tablet  Plan: Orders as above. Toradol. No NSAIDs. Tylenol. Heat. Stretches/exercises given. F/u prn. The patient voiced understanding and agreement to the plan.   Melanie Stout Melanie MindoroWendling, DO 03/24/17  10:03 AM

## 2017-03-24 NOTE — Addendum Note (Signed)
Addended by: Scharlene GlossEWING, ROBIN B on: 03/24/2017 10:13 AM   Modules accepted: Orders

## 2017-03-24 NOTE — Patient Instructions (Signed)
Throw away your 32 year old muscle relaxant.   OK to take Tylenol 1000 mg (2 extra strength tabs) or 975 mg (3 regular strength tabs) every 6 hours as needed.  Heat (pad or rice pillow in microwave) over affected area, 10-15 minutes every 2-3 hours while awake.   Do not use "NSAIDs" for the rest of the day.   Do not do anything that you cannot tolerate.   [facility] Ask your health care provider which exercises are safe for you. Do exercises exactly as told by your health care provider and adjust them as directed. It is normal to feel mild stretching, pulling, tightness, or discomfort as you do these exercises, but you should stop right away if you feel sudden pain or your pain gets worse.Do not begin these exercises until told by your health care provider. Stretching and range of motion exercises These exercises warm up your muscles and joints and improve the movement and flexibility of your shoulder. These exercises can also help to relieve pain, numbness, and tingling. If you are unable to do any of the following for any reason, do not further attempt to do it.  Exercise A: Flexion, standing    1. Stand and hold a broomstick, a cane, or a similar object. Place your hands a little more than shoulder-width apart on the object. Your left / right hand should be palm-up, and your other hand should be palm-down. 2. Push the stick to raise your left / right arm out to your side and then over your head. Use your other hand to help move the stick. Stop when you feel a stretch in your shoulder, or when you reach the angle that is recommended by your health care provider. ? Avoid shrugging your shoulder while you raise your arm. Keep your shoulder blade tucked down toward your spine. 3. Hold for 10-15 seconds. 4. Slowly return to the starting position. Repeat 2-3 times. Complete this exercise 1 time a day.  Exercise B: Abduction, supine    1. Lie on your back and hold a broomstick, a  cane, or a similar object. Place your hands a little more than shoulder-width apart on the object. Your left / right hand should be palm-up, and your other hand should be palm-down. 2. Push the stick to raise your left / right arm out to your side and then over your head. Use your other hand to help move the stick. Stop when you feel a stretch in your shoulder, or when you reach the angle that is recommended by your health care provider. ? Avoid shrugging your shoulder while you raise your arm. Keep your shoulder blade tucked down toward your spine. 3. Hold for 10-15 seconds. 4. Slowly return to the starting position. Repeat 2-3 times. Complete this exercise 1 time  a day.  Exercise C: Flexion, active-assisted    1. Lie on your back. You may bend your knees for comfort. 2. Hold a broomstick, a cane, or a similar object. Place your hands about shoulder-width apart on the object. Your palms should face toward your feet. 3. Raise the stick and move your arms over your head and behind your head, toward the floor. Use your healthy arm to help your left / right arm move farther. Stop when you feel a gentle stretch in your shoulder, or when you reach the angle where your health care provider tells you to stop. 4. Hold for 10-15 seconds. 5. Slowly return to the starting position. Repeat  2-3 times. Complete this exercise 1 time  a day.  Exercise D: External rotation and abduction    1. Stand in a door frame with one of your feet slightly in front of the other. This is called a staggered stance. 2. Choose one of the following positions as told by your health care provider: ? Place your hands and forearms on the door frame above your head. ? Place your hands and forearms on the door frame at the height of your head. ? Place your hands on the door frame at the height of your elbows. 3. Slowly move your weight onto your front foot until you feel a stretch across your chest and in the front of your  shoulders. Keep your head and chest upright and keep your abdominal muscles tight. 4. Hold for 10-15 seconds. 5. To release the stretch, shift your weight to your back foot. Repeat 2-3 times. Complete this stretch 1 time  a day.  Strengthening exercises These exercises build strength and endurance in your shoulder. Endurance is the ability to use your muscles for a long time, even after your muscles get tired. Exercise E: Scapular depression and adduction  1. Sit on a stable chair. Support your arms in front of you with pillows, armrests, or a tabletop. Keep your elbows in line with the sides of your body. 2. Gently move your shoulder blades down toward your middle back. Relax the muscles on the tops of your shoulders and in the back of your neck. 3. Hold for 10-15 seconds. 4. Slowly release the tension and relax your muscles completely before doing this exercise again. 5. After you have practiced this exercise, try doing the exercise without the arm support. Then, try the exercise while standing instead of sitting. Repeat 2-3 times. Complete this exercise 1 time  a day.  Exercise F: Shoulder abduction, isometric    1. Stand or sit about 4-6 inches (10-15 cm) from a wall with your left / right side facing the wall. 2. Bend your left / right elbow and gently press your elbow against the wall. 3. Increase the pressure slowly until you are pressing as hard as you can without shrugging your shoulder. 4. Hold for 10-15 seconds. 5. Slowly release the tension and relax your muscles completely. Repeat 2-3 times. Complete this exercise 1 time  a day.  Exercise G: Shoulder flexion, isometric    1. Stand or sit about 4-6 inches (10-15 cm) away from a wall with your left / right side facing the wall. 2. Keep your left / right elbow straight and gently press the top of your fist against the wall. Increase the pressure slowly until you are pressing as hard as you can without shrugging your  shoulder. 3. Hold for 10-15 seconds. 4. Slowly release the tension and relax your muscles completely. Repeat 2-3 times. Complete this exercise 1 time  a day.  Exercise H: Internal rotation    1. Sit in a stable chair without armrests, or stand. Secure an exercise band at your left / right side, at elbow height. 2. Place a soft object, such as a folded towel or a small pillow, under your left / right upper arm so your elbow is a few inches (about 8 cm) away from your side. 3. Hold the end of the exercise band so the band stretches. 4. Keeping your elbow pressed against the soft object under your arm, move your forearm across your body toward your abdomen. Keep your body steady  so the movement is only coming from your shoulder. 5. Hold for 10-15 seconds. 6. Slowly return to the starting position. Repeat 2-3 times. Complete this exercise 1 time  a day.  Exercise I: External rotation    1. Sit in a stable chair without armrests, or stand. 2. Secure an exercise band at your left / right side, at elbow height. 3. Place a soft object, such as a folded towel or a small pillow, under your left / right upper arm so your elbow is a few inches (about 8 cm) away from your side. 4. Hold the end of the exercise band so the band stretches. 5. Keeping your elbow pressed against the soft object under your arm, move your forearm out, away from your abdomen. Keep your body steady so the movement is only coming from your shoulder. 6. Hold for 10-15 seconds. 7. Slowly return to the starting position. Repeat 2-3 times. Complete this exercise 1 time  a day. Exercise J: Shoulder extension  1. Sit in a stable chair without armrests, or stand. Secure an exercise band to a stable object in front of you so the band is at shoulder height. 2. Hold one end of the exercise band in each hand. Your palms should face each other. 3. Straighten your elbows and lift your hands up to shoulder height. 4. Step back, away  from the secured end of the exercise band, until the band stretches. 5. Squeeze your shoulder blades together and pull your hands down to the sides of your thighs. Stop when your hands are straight down by your sides. Do not let your hands go behind your body. 6. Hold for 10-15 seconds. 7. Slowly return to the starting position. Repeat 2-3 times. Complete this exercise 1 time  a day.  Exercise K: Shoulder extension, prone    1. Lie on your abdomen on a firm surface so your left / right arm hangs over the edge. 2. Hold a 5 lb weight in your hand so your palm faces in toward your body. Your arm should be straight. 3. Squeeze your shoulder blade down toward the middle of your back. 4. Slowly raise your arm behind you, up to the height of the surface that you are lying on. Keep your arm straight. 5. Hold for 10-15 seconds. 6. Slowly return to the starting position and relax your muscles. Repeat 2-3 times. Complete this exercise 1 time  a day.  Exercise L: Horizontal abduction, prone  1. Lie on your abdomen on a firm surface so your left / right arm hangs over the edge. 2. Hold a 5 lb weight in your hand so your palm faces toward your feet. Your arm should be straight. 3. Squeeze your shoulder blade down toward the middle of your back. 4. Bend your elbow so your hand moves up, until your elbow is bent to an "L" shape (90 degrees). With your elbow bent, slowly move your forearm forward and up. Raise your hand up to the height of the surface that you are lying on. ? Your upper arm should not move, and your elbow should stay bent. ? At the top of the movement, your palm should face the floor. 5. Hold for 10-15 seconds. 6. Slowly return to the starting position and relax your muscles. Repeat 2-3 times. Complete this exercise 1 time a day.  Exercise M: Horizontal abduction, standing  1. Sit on a stable chair, or stand. 2. Secure an exercise band to a stable object in  front of you so the band is  at shoulder height. 3. Hold one end of the exercise band in each hand. 4. Straighten your elbows and lift your hands straight in front of you, up to shoulder height. Your palms should face down, toward the floor. 5. Step back, away from the secured end of the exercise band, until the band stretches. 6. Move your arms out to your sides, and keep your arms straight. 7. Hold for 10-15 seconds. 8. Slowly return to the starting position. Repeat 2-3 times. Complete this exercise 1 time a day.  Exercise N: Scapular retraction and elevation  1. Sit on a stable chair, or stand. 2. Secure an exercise band to a stable object in front of you so the band is at shoulder height. 3. Hold one end of the exercise band in each hand. Your palms should face each other. 4. Sit in a stable chair without armrests, or stand. 5. Step back, away from the secured end of the exercise band, until the band stretches. 6. Squeeze your shoulder blades together and lift your hands over your head. Keep your elbows straight. 7. Hold for 10-15 seconds. 8. Slowly return to the starting position. Repeat 3-4 times. Complete this exercise 1-2 times a day.  This information is not intended to replace advice given to you by your health care provider. Make sure you discuss any questions you have with your health care provider. Document Released: 05/20/2005 Document Revised: 01/25/2016 Document Reviewed: 04/06/2015 Elsevier Interactive Patient Education  2017 [employer].

## 2017-03-24 NOTE — Progress Notes (Signed)
Pre visit review using our clinic review tool, if applicable. No additional management support is needed unless otherwise documented below in the visit note. 

## 2017-03-26 ENCOUNTER — Telehealth: Payer: Self-pay | Admitting: Family Medicine

## 2017-03-26 ENCOUNTER — Encounter: Payer: Self-pay | Admitting: Family Medicine

## 2017-03-26 MED ORDER — TRAMADOL HCL 50 MG PO TABS: 50.0000 mg | ORAL_TABLET | Freq: Two times a day (BID) | ORAL | 0 refills | Status: DC | PRN

## 2017-03-26 NOTE — Telephone Encounter (Signed)
Please phone in tramadol 50 mg, take 1 tab every 12 hours as needed for pain, disp 8. If this is not helpful, she needs to make an appt for further evaluation/management. She is not to become pregnant on this temporary medicine. TY.

## 2017-03-26 NOTE — Telephone Encounter (Signed)
Telephoned in this medication to the pharmacy. Called the patient informed of PCP instructions. She did verbalized understanding.

## 2017-03-26 NOTE — Telephone Encounter (Signed)
Relation to pt: self  Call back [phone] Pharmacy: [facility] [address] - [place], [place] - [address] [phone] ([phone] (Fax)     Reason for call:  Patient states medication prescribed on 03/24/17 by Dr. Carmelia RollerWendling is not working, back, shoulder and neck pain not improving, requesting pain medication please advise

## 2017-03-31 ENCOUNTER — Ambulatory Visit: Payer: 59 | Admitting: Family Medicine

## 2017-03-31 DIAGNOSIS — Z0289 Encounter for other administrative examinations: Secondary | ICD-10-CM

## 2017-04-10 ENCOUNTER — Ambulatory Visit (INDEPENDENT_AMBULATORY_CARE_PROVIDER_SITE_OTHER): Payer: 59 | Admitting: Family Medicine

## 2017-04-10 ENCOUNTER — Encounter: Payer: Self-pay | Admitting: Family Medicine

## 2017-04-10 VITALS — BP 122/78 | HR 88 | Temp 98.1°F | Ht 66.0 in | Wt 263.5 lb

## 2017-04-10 DIAGNOSIS — M542 Cervicalgia: Secondary | ICD-10-CM

## 2017-04-10 MED ORDER — KETOROLAC TROMETHAMINE 10 MG PO TABS: 10.0000 mg | ORAL_TABLET | Freq: Four times a day (QID) | ORAL | 1 refills | Status: DC | PRN

## 2017-04-10 MED ORDER — KETOROLAC TROMETHAMINE 60 MG/2ML IM SOLN
60.0000 mg | Freq: Once | INTRAMUSCULAR | Status: AC
  Administered 2017-04-10: 60 mg via INTRAMUSCULAR

## 2017-04-10 NOTE — Progress Notes (Signed)
Musculoskeletal Exam  Patient: Melanie Stout DOB: Mar 15, 1986  DOS: 04/10/2017  SUBJECTIVE:  Chief Complaint:   Chief Complaint  Patient presents with  . Neck Pain  . Shoulder Pain    right arm    Melanie Stout is a 31 y.o.  female for evaluation and treatment of neck pain.   Onset:  3 weeks ago. Woke up w crick in neck, progressed.  Location: R side of lower neck Character:  aching and sharp  Progression of issue:  is unchanged Associated symptoms: None Treatment: to date has been rest, steroids, msc relaxer.   Neurovascular symptoms: no  ROS: Musculoskeletal/Extremities: +neck pain Neurologic: no numbness, tingling no weakness   Past Medical History:  Diagnosis Date  . Abnormal Pap smear   . Headache(784.0)   . Hx of varicella   . PIH (pregnancy induced hypertension)    Past Surgical History:  Procedure Laterality Date  . NOSE SURGERY     Family History  Problem Relation Age of Onset  . Hypertension Mother   . Parkinson's disease Father   . Diabetes Maternal Aunt        Objective: VITAL SIGNS: BP 122/78 (BP Location: Left Arm, Patient Position: Sitting, Cuff Size: Large)   Pulse 88   Temp 98.1 F (36.7 C) (Oral)   Ht 5\' 6"  (1.676 m)   Wt 263 lb 8 oz (119.5 kg)   SpO2 98%   BMI 42.53 kg/m  Constitutional: Well formed, well developed. No acute distress. Cardiovascular: Brisk cap refill Thorax & Lungs: No accessory muscle use Extremities: No clubbing. No cyanosis. No edema.  Skin: Warm. Dry. No erythema. No rash.  Musculoskeletal: neck.   Normal active range of motion: yes.   Normal passive range of motion: yes Tenderness to palpation: yes, over lower cerv paraspinal msc on R Deformity: no Ecchymosis: no  Tests negative: spurling's 5/5 strength throughout UE's b/l Neurologic: Normal sensory function. No focal deficits noted. DTR's equal and symmetry in UE's. No clonus. Psychiatric: Normal mood. Age appropriate judgment  and insight. Alert & oriented x 3.    PROCEDURE NOTE After discussing the procedure and risks, including but not limited to increased pain or stiffness and rarely nausea or dizziness, verbal consent was obtained.  Pre-procedure diagnosis: Somatic dysfunction Post-procedure diagnosis: Same Procedure: OMT  Regions treated include cerv: Soft tissue with the tissue response noted to be Improved.  The patient tolerated the procedure well, and there were no complications noted.  The patient was warned of the possibility of increased pain or stiffness of up to 48 hours duration and was asked to call with any unexpected problems.   Assessment:  Neck pain  Plan:  Toradol injection. PO toradol for home as injection helped the most. Stretches/exercises specifically for neck. Call in 4 weeks if no improvement, will refer to PT.  F/u prn.  The patient voiced understanding and agreement to the plan.   Jilda Rocheicholas Paul BrentWendling, DO 04/10/17  4:48 PM

## 2017-04-10 NOTE — Addendum Note (Signed)
Addended by: Scharlene GlossEWING, ROBIN B on: 04/10/2017 04:53 PM   Modules accepted: Orders

## 2017-04-10 NOTE — Progress Notes (Signed)
Pre visit review using our clinic review tool, if applicable. No additional management support is needed unless otherwise documented below in the visit note. 

## 2017-04-10 NOTE — Patient Instructions (Signed)
Good to see you again.  Send me a MyChart message in 4 weeks if you are not better. Sooner if you are getting worse.   Heat (pad or rice pillow in microwave) over affected area, 10-15 minutes every 2-3 hours while awake.   EXERCISES RANGE OF MOTION (ROM) AND STRETCHING EXERCISES  These exercises may help you when beginning to rehabilitate your issue. In order to successfully resolve your symptoms, you must improve your posture. These exercises are designed to help reduce the forward-head and rounded-shoulder posture which contributes to this condition. Your symptoms may resolve with or without further involvement from your physician, physical therapist or athletic trainer. While completing these exercises, remember:   Restoring tissue flexibility helps normal motion to return to the joints. This allows healthier, less painful movement and activity.  An effective stretch should be held for at least 20 seconds, although you may need to begin with shorter hold times for comfort.  A stretch should never be painful. You should only feel a gentle lengthening or release in the stretched tissue.  Do not do any stretch or exercise that you cannot tolerate.  STRETCH- Axial Extensors  Lie on your back on the floor. You may bend your knees for comfort. Place a rolled-up hand towel or dish towel, about 2 inches in diameter, under the part of your head that makes contact with the floor.  Gently tuck your chin, as if trying to make a "double chin," until you feel a gentle stretch at the base of your head.  Hold 15-20 seconds. Repeat 2-3 times. Complete this exercise 1 time per day.   STRETCH - Axial Extension   Stand or sit on a firm surface. Assume a good posture: chest up, shoulders drawn back, abdominal muscles slightly tense, knees unlocked (if standing) and feet hip width apart.  Slowly retract your chin so your head slides back and your chin slightly lowers. Continue to look straight  ahead.  You should feel a gentle stretch in the back of your head. Be certain not to feel an aggressive stretch since this can cause headaches later.  Hold for 15-20 seconds. Repeat 2-3 times. Complete this exercise 1 time per day.  STRETCH - Cervical Side Bend   Stand or sit on a firm surface. Assume a good posture: chest up, shoulders drawn back, abdominal muscles slightly tense, knees unlocked (if standing) and feet hip width apart.  Without letting your nose or shoulders move, slowly tip your right / left ear to your shoulder until your feel a gentle stretch in the muscles on the opposite side of your neck.  Hold 15-20 seconds. Repeat 2-3 times. Complete this exercise 1-2 times per day.  STRETCH - Cervical Rotators   Stand or sit on a firm surface. Assume a good posture: chest up, shoulders drawn back, abdominal muscles slightly tense, knees unlocked (if standing) and feet hip width apart.  Keeping your eyes level with the ground, slowly turn your head until you feel a gentle stretch along the back and opposite side of your neck.  Hold 15-20 seconds. Repeat 2-3 times. Complete this exercise 1-2 times per day.  RANGE OF MOTION - Neck Circles   Stand or sit on a firm surface. Assume a good posture: chest up, shoulders drawn back, abdominal muscles slightly tense, knees unlocked (if standing) and feet hip width apart.  Gently roll your head down and around from the back of one shoulder to the back of the other. The motion should  never be forced or painful.  Repeat the motion 10-20 times, or until you feel the neck muscles relax and loosen. Repeat 2-3 times. Complete the exercise 1-2 times per day. STRENGTHENING EXERCISES - Cervical Strain and Sprain These exercises may help you when beginning to rehabilitate your injury. They may resolve your symptoms with or without further involvement from your physician, physical therapist, or athletic trainer. While completing these exercises,  remember:   Muscles can gain both the endurance and the strength needed for everyday activities through controlled exercises.  Complete these exercises as instructed by your physician, physical therapist, or athletic trainer. Progress the resistance and repetitions only as guided.  You may experience muscle soreness or fatigue, but the pain or discomfort you are trying to eliminate should never worsen during these exercises. If this pain does worsen, stop and make certain you are following the directions exactly. If the pain is still present after adjustments, discontinue the exercise until you can discuss the trouble with your clinician.  STRENGTH - Cervical Flexors, Isometric  Face a wall, standing about 6 inches away. Place a small pillow, a ball about 6-8 inches in diameter, or a folded towel between your forehead and the wall.  Slightly tuck your chin and gently push your forehead into the soft object. Push only with mild to moderate intensity, building up tension gradually. Keep your jaw and forehead relaxed.  Hold 10 to 20 seconds. Keep your breathing relaxed.  Release the tension slowly. Relax your neck muscles completely before you start the next repetition. Repeat 2-3 times. Complete this exercise 1 time per day.  STRENGTH- Cervical Lateral Flexors, Isometric   Stand about 6 inches away from a wall. Place a small pillow, a ball about 6-8 inches in diameter, or a folded towel between the side of your head and the wall.  Slightly tuck your chin and gently tilt your head into the soft object. Push only with mild to moderate intensity, building up tension gradually. Keep your jaw and forehead relaxed.  Hold 10 to 20 seconds. Keep your breathing relaxed.  Release the tension slowly. Relax your neck muscles completely before you start the next repetition. Repeat 2-3 times. Complete this exercise 1 time per day.  STRENGTH - Cervical Extensors, Isometric   Stand about 6 inches away  from a wall. Place a small pillow, a ball about 6-8 inches in diameter, or a folded towel between the back of your head and the wall.  Slightly tuck your chin and gently tilt your head back into the soft object. Push only with mild to moderate intensity, building up tension gradually. Keep your jaw and forehead relaxed.  Hold 10 to 20 seconds. Keep your breathing relaxed.  Release the tension slowly. Relax your neck muscles completely before you start the next repetition. Repeat 2-3 times. Complete this exercise 1 time per day.  POSTURE AND BODY MECHANICS CONSIDERATIONS Keeping correct posture when sitting, standing or completing your activities will reduce the stress put on different body tissues, allowing injured tissues a chance to heal and limiting painful experiences. The following are general guidelines for improved posture. Your physician or physical therapist will provide you with any instructions specific to your needs. While reading these guidelines, remember:  The exercises prescribed by your provider will help you have the flexibility and strength to maintain correct postures.  The correct posture provides the optimal environment for your joints to work. All of your joints have less wear and tear when properly supported by  a spine with good posture. This means you will experience a healthier, less painful body.  Correct posture must be practiced with all of your activities, especially prolonged sitting and standing. Correct posture is as important when doing repetitive low-stress activities (typing) as it is when doing a single heavy-load activity (lifting).  PROLONGED STANDING WHILE SLIGHTLY LEANING FORWARD When completing a task that requires you to lean forward while standing in one place for a long time, place either foot up on a stationary 2- to 4-inch high object to help maintain the best posture. When both feet are on the ground, the low back tends to lose its slight inward  curve. If this curve flattens (or becomes too large), then the back and your other joints will experience too much stress, fatigue more quickly, and can cause pain.   RESTING POSITIONS Consider which positions are most painful for you when choosing a resting position. If you have pain with flexion-based activities (sitting, bending, stooping, squatting), choose a position that allows you to rest in a less flexed posture. You would want to avoid curling into a fetal position on your side. If your pain worsens with extension-based activities (prolonged standing, working overhead), avoid resting in an extended position such as sleeping on your stomach. Most people will find more comfort when they rest with their spine in a more neutral position, neither too rounded nor too arched. Lying on a non-sagging bed on your side with a pillow between your knees, or on your back with a pillow under your knees will often provide some relief. Keep in mind, being in any one position for a prolonged period of time, no matter how correct your posture, can still lead to stiffness.  WALKING Walk with an upright posture. Your ears, shoulders, and hips should all line up. OFFICE WORK When working at a desk, create an environment that supports good, upright posture. Without extra support, muscles fatigue and lead to excessive strain on joints and other tissues.  CHAIR:  A chair should be able to slide under your desk when your back makes contact with the back of the chair. This allows you to work closely.  The chair's height should allow your eyes to be level with the upper part of your monitor and your hands to be slightly lower than your elbows.  Body position: ? Your feet should make contact with the floor. If this is not possible, use a foot rest. ? Keep your ears over your shoulders. This will reduce stress on your neck and low back.

## 2017-05-22 ENCOUNTER — Ambulatory Visit (INDEPENDENT_AMBULATORY_CARE_PROVIDER_SITE_OTHER): Payer: 59 | Admitting: Family Medicine

## 2017-05-22 ENCOUNTER — Ambulatory Visit: Payer: 59 | Admitting: Family Medicine

## 2017-05-22 ENCOUNTER — Encounter: Payer: Self-pay | Admitting: Family Medicine

## 2017-05-22 VITALS — BP 123/69 | HR 70 | Temp 98.1°F | Resp 16 | Ht 66.0 in | Wt 272.6 lb

## 2017-05-22 DIAGNOSIS — R5382 Chronic fatigue, unspecified: Secondary | ICD-10-CM | POA: Diagnosis not present

## 2017-05-22 NOTE — Progress Notes (Addendum)
[facility] [address], [address] [place], [place] [address] [phone] [phone]  Date:  05/22/2017   Name:  Melanie ColonelWhitney Melanie Stout   DOB:  13-Sep-1985   MRN:  [id number]  PCP:  Pearline Cablesopland, Zoelle Markus C, MD    Chief Complaint: No chief complaint on file.   History of Present Illness:  Melanie Erskine SpeedShonta Stout is a 31 y.o. very pleasant female patient who presents with the following:  I last saw her in March of this year She is here because she is feeling very tired- she means sleepy- nearly all of the time  She notes that she often will have to leave work in the middle of the day to take a nap She has noted this for about one month or a bit longer She has gained some weight- she is not sure if his could be related She had some shoulder and neck pain the last time she was seen- this has now resolved  She is not aware of any snoring at night She does not feel rested when she wakes up in the am She is having a hard time getting out of bed in the am as she is just so tired  However she is not feeling depressed  She normally goes to bed at 12 and has to rise at 6:45- however recently she has sometimes been sleeping late to 8:30 or so and has been late to work  Her LMP was about 2 months ago- however this is not unusual for her so she does not really suspect pregnancy  Wt Readings from Last 3 Encounters:  05/22/17 272 lb 9.6 oz (123.7 kg)  04/10/17 263 lb 8 oz (119.5 kg)  03/24/17 260 lb 2 oz (118 kg)     Patient Active Problem List   Diagnosis Date Noted  . Obesity, unspecified obesity severity, unspecified obesity type 08/01/2016  . Tobacco use disorder 06/05/2016    Past Medical History:  Diagnosis Date  . Abnormal Pap smear   . Headache(784.0)   . Hx of varicella   . PIH (pregnancy induced hypertension)     Past Surgical History:  Procedure Laterality Date  . NOSE SURGERY      Social History   Tobacco Use  .  Smoking status: Former Smoker    Packs/day: 0.50    Last attempt to quit: 07/24/2016    Years since quitting: 0.8  . Smokeless tobacco: Never Used  Substance Use Topics  . Alcohol use: Yes    Comment: occ  . Drug use: No    Family History  Problem Relation Age of Onset  . Hypertension Mother   . Parkinson's disease Father   . Diabetes Maternal Aunt     Allergies  Allergen Reactions  . Fluconazole Other (See Comments)    Feels like skin on face was crawling     Medication list has been reviewed and updated.  Current Outpatient Medications on File Prior to Visit  Medication Sig Dispense Refill  . ketorolac (TORADOL) 10 MG tablet Take 1 tablet (10 mg total) every 6 (six) hours as needed by mouth. 30 tablet 1   No current facility-administered medications on file prior to visit.     Review of Systems:  As per HPI- otherwise negative. No further chest pains or heart concerns  No fever or chills No rash She has noted weight gain   Physical Examination: Vitals:   05/22/17 1713  BP: 123/69  Pulse: 70  Resp: 16  Temp: 98.1 F (36.7 C)  SpO2: 100%   Vitals:   05/22/17 1713  Weight: 272 lb 9.6 oz (123.7 kg)  Height: 5\' 6"  (1.676 m)   Body mass index is 44 kg/m. Ideal Body Weight: Weight in (lb) to have BMI = 25: 154.6  GEN: WDWN, NAD, Non-toxic, A & O x 3 HEENT: Atraumatic, Normocephalic. Neck supple. No masses, No LAD. Bilateral TM wnl, oropharynx normal.  PEERL,EOMI.   She hs a small posterior oropharynx which would pre-dispose to OSA, esp with obesity  Ears and Nose: No external deformity.   CV: RRR, No M/G/R. No JVD. No thrill. No extra heart sounds. PULM: CTA B, no wheezes, crackles, rhonchi. No retractions. No resp. distress. No accessory muscle use. ABD: S, NT, ND EXTR: No c/c/e NEURO Normal gait.  PSYCH: Normally interactive. Conversant. Not depressed or anxious appearing.  Calm demeanor.  Obese, otherwise looks well    Assessment and  Plan: Chronic fatigue - Plan: CBC, Comprehensive metabolic panel, TSH, POCT urine pregnancy  Here today with fatigue/ sleepiness for 1-2 months HCG is negative Suspect OSA Labs pending as above- if all ok will plan to have her tested for OSA   Signed Abbe AmsterdamJessica Olson Lucarelli, MD  Received her labs on 12/21- message to pt  Your labs are all normal.  I think we should continue to look into the possibility of sleep apnea for you.  I will arrange for a sleep study consultation- please let me know if any questions!  Results for orders placed or performed in visit on 05/22/17  CBC  Result Value Ref Range   WBC 8.3 4.0 - 10.5 K/uL   RBC 4.65 3.87 - 5.11 Mil/uL   Platelets 287.0 150.0 - 400.0 K/uL   Hemoglobin 12.7 12.0 - 15.0 g/dL   HCT 16.139.9 09.636.0 - 04.546.0 %   MCV 85.7 78.0 - 100.0 fl   MCHC 31.9 30.0 - 36.0 g/dL   RDW 40.913.7 81.111.5 - 91.415.5 %  Comprehensive metabolic panel  Result Value Ref Range   Sodium 137 135 - 145 mEq/L   Potassium 4.2 3.5 - 5.1 mEq/L   Chloride 107 96 - 112 mEq/L   CO2 27 19 - 32 mEq/L   Glucose, Bld 86 70 - 99 mg/dL   BUN 18 6 - 23 mg/dL   Creatinine, Ser 7.820.80 0.40 - 1.20 mg/dL   Total Bilirubin 0.2 0.2 - 1.2 mg/dL   Alkaline Phosphatase 58 39 - 117 U/L   AST 15 0 - 37 U/L   ALT 23 0 - 35 U/L   Total Protein 6.4 6.0 - 8.3 g/dL   Albumin 3.8 3.5 - 5.2 g/dL   Calcium 8.4 8.4 - 95.610.5 mg/dL   GFR 213.08106.35 >65.78>60.00 mL/min  TSH  Result Value Ref Range   TSH 1.38 0.35 - 4.50 uIU/mL  POCT urine pregnancy  Result Value Ref Range   Preg Test, Ur Negative Negative

## 2017-05-22 NOTE — Patient Instructions (Signed)
Please have your blood drawn today- we will look for any cause of your fatigue.  If all your labs are normal I will plan to have you tested for sleep apnea

## 2017-05-22 NOTE — Progress Notes (Deleted)
[facility] [address], [address] [place], [place] [address] [phone] [phone]  Date:  05/22/2017   Name:  Lendon ColonelWhitney Shonta Mccaughey   DOB:  01/12/1986   MRN:  [id number]  PCP:  Pearline Cablesopland, Mabel Unrein C, MD    Chief Complaint: No chief complaint on file.   History of Present Illness:  Julea Erskine SpeedShonta Vangorder is a 31 y.o. very pleasant female patient who presents with the following:  Last seen by myself in March after an episode of CP and ER evaluation  Patient Active Problem List   Diagnosis Date Noted  . Obesity, unspecified obesity severity, unspecified obesity type 08/01/2016  . Tobacco use disorder 06/05/2016    Past Medical History:  Diagnosis Date  . Abnormal Pap smear   . Headache(784.0)   . Hx of varicella   . PIH (pregnancy induced hypertension)     Past Surgical History:  Procedure Laterality Date  . NOSE SURGERY      Social History   Tobacco Use  . Smoking status: Former Smoker    Packs/day: 0.50    Last attempt to quit: 07/24/2016    Years since quitting: 0.8  . Smokeless tobacco: Never Used  Substance Use Topics  . Alcohol use: Yes    Comment: occ  . Drug use: No    Family History  Problem Relation Age of Onset  . Hypertension Mother   . Parkinson's disease Father   . Diabetes Maternal Aunt     Allergies  Allergen Reactions  . Fluconazole Other (See Comments)    Feels like skin on face was crawling     Medication list has been reviewed and updated.  Current Outpatient Medications on File Prior to Visit  Medication Sig Dispense Refill  . ketorolac (TORADOL) 10 MG tablet Take 1 tablet (10 mg total) every 6 (six) hours as needed by mouth. 30 tablet 1   No current facility-administered medications on file prior to visit.     Review of Systems:  As per HPI- otherwise negative.   Physical Examination: There were no vitals filed for this visit. There were no vitals filed for this  visit. There is no height or weight on file to calculate BMI. Ideal Body Weight:    GEN: WDWN, NAD, Non-toxic, A & O x 3 HEENT: Atraumatic, Normocephalic. Neck supple. No masses, No LAD. Ears and Nose: No external deformity. CV: RRR, No M/G/R. No JVD. No thrill. No extra heart sounds. PULM: CTA B, no wheezes, crackles, rhonchi. No retractions. No resp. distress. No accessory muscle use. ABD: S, NT, ND, +BS. No rebound. No HSM. EXTR: No c/c/e NEURO Normal gait.  PSYCH: Normally interactive. Conversant. Not depressed or anxious appearing.  Calm demeanor.    Assessment and Plan: ***  Signed Abbe AmsterdamJessica Marrie Chandra, MD

## 2017-05-23 ENCOUNTER — Encounter: Payer: Self-pay | Admitting: Family Medicine

## 2017-05-23 LAB — CBC
HCT: 39.9 % (ref 36.0–46.0)
HEMOGLOBIN: 12.7 g/dL (ref 12.0–15.0)
MCHC: 31.9 g/dL (ref 30.0–36.0)
MCV: 85.7 fl (ref 78.0–100.0)
PLATELETS: 287 10*3/uL (ref 150.0–400.0)
RBC: 4.65 Mil/uL (ref 3.87–5.11)
RDW: 13.7 % (ref 11.5–15.5)
WBC: 8.3 10*3/uL (ref 4.0–10.5)

## 2017-05-23 LAB — COMPREHENSIVE METABOLIC PANEL
ALK PHOS: 58 U/L (ref 39–117)
ALT: 23 U/L (ref 0–35)
AST: 15 U/L (ref 0–37)
Albumin: 3.8 g/dL (ref 3.5–5.2)
BILIRUBIN TOTAL: 0.2 mg/dL (ref 0.2–1.2)
BUN: 18 mg/dL (ref 6–23)
CO2: 27 meq/L (ref 19–32)
CREATININE: 0.8 mg/dL (ref 0.40–1.20)
Calcium: 8.4 mg/dL (ref 8.4–10.5)
Chloride: 107 mEq/L (ref 96–112)
GFR: 106.35 mL/min (ref >60.00)
GLUCOSE: 86 mg/dL (ref 70–99)
Potassium: 4.2 mEq/L (ref 3.5–5.1)
Sodium: 137 mEq/L (ref 135–145)
Total Protein: 6.4 g/dL (ref 6.0–8.3)

## 2017-05-23 LAB — POCT URINE PREGNANCY: Preg Test, Ur: NEGATIVE

## 2017-05-23 LAB — TSH: TSH: 1.38 u[IU]/mL (ref 0.35–4.50)

## 2017-05-23 NOTE — Addendum Note (Signed)
Addended by: Abbe AmsterdamOPLAND, JESSICA C on: 05/23/2017 02:38 PM   Modules accepted: Orders

## 2017-08-14 ENCOUNTER — Encounter: Payer: Self-pay | Admitting: Family Medicine

## 2017-08-14 ENCOUNTER — Emergency Department (HOSPITAL_BASED_OUTPATIENT_CLINIC_OR_DEPARTMENT_OTHER)
Admission: EM | Admit: 2017-08-14 | Discharge: 2017-08-14 | Disposition: A | Discharge status: Home / Self Care | Payer: 59 | Attending: Emergency Medicine | Admitting: Emergency Medicine

## 2017-08-14 ENCOUNTER — Other Ambulatory Visit: Payer: Self-pay

## 2017-08-14 ENCOUNTER — Encounter (HOSPITAL_BASED_OUTPATIENT_CLINIC_OR_DEPARTMENT_OTHER): Payer: Self-pay | Admitting: Emergency Medicine

## 2017-08-14 ENCOUNTER — Ambulatory Visit (INDEPENDENT_AMBULATORY_CARE_PROVIDER_SITE_OTHER): Payer: 59 | Admitting: Family Medicine

## 2017-08-14 ENCOUNTER — Emergency Department (HOSPITAL_BASED_OUTPATIENT_CLINIC_OR_DEPARTMENT_OTHER): Payer: 59

## 2017-08-14 VITALS — BP 142/94 | HR 68 | Temp 98.9°F | Ht 66.0 in | Wt 271.6 lb

## 2017-08-14 DIAGNOSIS — R0789 Other chest pain: Secondary | ICD-10-CM

## 2017-08-14 DIAGNOSIS — J029 Acute pharyngitis, unspecified: Secondary | ICD-10-CM | POA: Diagnosis not present

## 2017-08-14 DIAGNOSIS — Z87891 Personal history of nicotine dependence: Secondary | ICD-10-CM | POA: Insufficient documentation

## 2017-08-14 DIAGNOSIS — J189 Pneumonia, unspecified organism: Secondary | ICD-10-CM

## 2017-08-14 DIAGNOSIS — R079 Chest pain, unspecified: Secondary | ICD-10-CM | POA: Diagnosis present

## 2017-08-14 LAB — CBC
HEMATOCRIT: 39.8 % (ref 36.0–46.0)
Hemoglobin: 13.6 g/dL (ref 12.0–15.0)
MCH: 27.6 pg (ref 26.0–34.0)
MCHC: 34.2 g/dL (ref 30.0–36.0)
MCV: 80.7 fL (ref 78.0–100.0)
PLATELETS: 294 10*3/uL (ref 150–400)
RBC: 4.93 MIL/uL (ref 3.87–5.11)
RDW: 13.6 % (ref 11.5–15.5)
WBC: 6.9 10*3/uL (ref 4.0–10.5)

## 2017-08-14 LAB — BASIC METABOLIC PANEL
Anion gap: 7 (ref 5–15)
BUN: 15 mg/dL (ref 6–20)
CHLORIDE: 105 mmol/L (ref 101–111)
CO2: 22 mmol/L (ref 22–32)
CREATININE: 0.6 mg/dL (ref 0.44–1.00)
Calcium: 9 mg/dL (ref 8.9–10.3)
GFR calc Af Amer: >60 mL/min (ref >60)
GFR calc non Af Amer: >60 mL/min (ref >60)
GLUCOSE: 81 mg/dL (ref 65–99)
POTASSIUM: 4 mmol/L (ref 3.5–5.1)
SODIUM: 134 mmol/L — AB (ref 135–145)

## 2017-08-14 LAB — TROPONIN I
Troponin I: <0.03 ng/mL (ref <0.03)
Troponin I: <0.03 ng/mL (ref <0.03)

## 2017-08-14 LAB — POCT RAPID STREP A (OFFICE): RAPID STREP A SCREEN: NEGATIVE

## 2017-08-14 LAB — PREGNANCY, URINE: Preg Test, Ur: NEGATIVE

## 2017-08-14 MED ORDER — AZITHROMYCIN 250 MG PO TABS
500.0000 mg | ORAL_TABLET | Freq: Once | ORAL | Status: AC
  Administered 2017-08-14: 500 mg via ORAL
  Filled 2017-08-14: qty 2

## 2017-08-14 MED ORDER — SODIUM CHLORIDE 0.9 % IV BOLUS (SEPSIS)
1000.0000 mL | Freq: Once | INTRAVENOUS | Status: AC
  Administered 2017-08-14: 1000 mL via INTRAVENOUS

## 2017-08-14 MED ORDER — AZITHROMYCIN 250 MG PO TABS: 250.0000 mg | ORAL_TABLET | Freq: Every day | ORAL | 0 refills | Status: AC

## 2017-08-14 MED ORDER — GI COCKTAIL ~~LOC~~: 30.0000 mL | Freq: Once | ORAL | Status: DC

## 2017-08-14 MED ORDER — NAPROXEN 500 MG PO TABS: 500.0000 mg | ORAL_TABLET | Freq: Two times a day (BID) | ORAL | 0 refills | Status: DC

## 2017-08-14 MED ORDER — KETOROLAC TROMETHAMINE 30 MG/ML IJ SOLN
30.0000 mg | Freq: Once | INTRAMUSCULAR | Status: AC
  Administered 2017-08-14: 30 mg via INTRAVENOUS
  Filled 2017-08-14: qty 1

## 2017-08-14 NOTE — ED Provider Notes (Signed)
[facility] EMERGENCY DEPARTMENT Provider Note   CSN: [id number] Arrival date & time: 08/14/17  1330     History   Chief Complaint Chief Complaint  Patient presents with  . Chest Pain  . Dizziness    HPI Melanie Stout is a 32 y.o. female who presents to ED for evaluation of 3-day history of centralized chest pain, feelings of lightheadedness.  She describes the chest pain as sharp, nonradiating and worse with palpation.  She was recovering from a URI symptoms approximately 1.5 weeks ago including cough, sinus pressure.  She was seen and evaluated by her PCP and was given GI cocktail without relief.  She was then sent to the ED for further management.  No previous history of similar symptoms in the past.  Denies any hemoptysis, prior PE or DVT, recent prolonged travel, recent surgeries, OCP use, leg swelling, history of MI, CHF, vomiting, abdominal pain, fever.  HPI  Past Medical History:  Diagnosis Date  . Abnormal Pap smear   . Headache(784.0)   . Hx of varicella   . PIH (pregnancy induced hypertension)     Patient Active Problem List   Diagnosis Date Noted  . Obesity, unspecified obesity severity, unspecified obesity type 08/01/2016  . Tobacco use disorder 06/05/2016    Past Surgical History:  Procedure Laterality Date  . NOSE SURGERY      OB History    Gravida Para Term Preterm AB Living   2 2 1  0 0 1   SAB TAB Ectopic Multiple Live Births   0 0 0   1       Home Medications    Prior to Admission medications   Medication Sig Start Date End Date Taking? Authorizing Provider  azithromycin (ZITHROMAX) 250 MG tablet Take 1 tablet (250 mg total) by mouth daily for 4 days. Take first 2 tablets together, then 1 every day until finished. 08/14/17 08/18/17  Efosa Treichler, PA-C  ketorolac (TORADOL) 10 MG tablet Take 1 tablet (10 mg total) every 6 (six) hours as needed by mouth. 04/10/17   Sharlene Dory, DO  naproxen (NAPROSYN) 500 MG tablet  Take 1 tablet (500 mg total) by mouth 2 (two) times daily. 08/14/17   Dietrich Pates, PA-C    Family History Family History  Problem Relation Age of Onset  . Hypertension Mother   . Parkinson's disease Father   . Diabetes Maternal Aunt     Social History Social History   Tobacco Use  . Smoking status: Former Smoker    Packs/day: 0.50    Last attempt to quit: 07/24/2016    Years since quitting: 1.0  . Smokeless tobacco: Never Used  Substance Use Topics  . Alcohol use: Yes    Comment: occ  . Drug use: No     Allergies   Fluconazole   Review of Systems Review of Systems  Constitutional: Negative for appetite change, chills and fever.  HENT: Negative for ear pain, rhinorrhea, sneezing and sore throat.   Eyes: Negative for photophobia and visual disturbance.  Respiratory: Positive for cough. Negative for choking, chest tightness, shortness of breath and wheezing.   Cardiovascular: Positive for chest pain. Negative for palpitations.  Gastrointestinal: Negative for abdominal pain, blood in stool, constipation, diarrhea, nausea and vomiting.  Genitourinary: Negative for dysuria, hematuria and urgency.  Musculoskeletal: Negative for myalgias.  Skin: Negative for rash.  Neurological: Positive for light-headedness. Negative for dizziness and weakness.     Physical Exam Updated Vital Signs BP  113/85 (BP Location: Right Arm)   Pulse 70   Temp 98.6 F (37 C) (Oral)   Resp 16   Ht 5\' 6"  (1.676 m)   Wt 122.9 kg (271 lb)   SpO2 100%   BMI 43.74 kg/m   Physical Exam  Constitutional: She appears well-developed and well-nourished. No distress.  Nontoxic appearing and in no acute distress.  Does not appear dehydrated.  HENT:  Head: Normocephalic and atraumatic.  Nose: Nose normal.  Eyes: Conjunctivae and EOM are normal. Left eye exhibits no discharge. No scleral icterus.  Neck: Normal range of motion. Neck supple.  Cardiovascular: Normal rate, regular rhythm, normal heart  sounds and intact distal pulses. Exam reveals no gallop and no friction rub.  No murmur heard. Pulmonary/Chest: Effort normal and breath sounds normal. No respiratory distress. She exhibits tenderness and bony tenderness.  Chest pain reproducible with palpation.    Abdominal: Soft. Bowel sounds are normal. She exhibits no distension. There is no tenderness. There is no guarding.  Musculoskeletal: Normal range of motion. She exhibits no edema.  No lower extremity edema, erythema or calf tenderness noted bilaterally.  Neurological: She is alert. She exhibits normal muscle tone. Coordination normal.  Skin: Skin is warm and dry. No rash noted.  Psychiatric: She has a normal mood and affect.  Nursing note and vitals reviewed.    ED Treatments / Results  Labs (all labs ordered are listed, but only abnormal results are displayed) Labs Reviewed  BASIC METABOLIC PANEL - Abnormal; Notable for the following components:      Result Value   Sodium 134 (*)    All other components within normal limits  CBC  TROPONIN I  PREGNANCY, URINE  TROPONIN I    EKG  EKG Interpretation  Date/Time:  Thursday August 14 2017 13:54:00 EDT Ventricular Rate:  68 PR Interval:  138 QRS Duration: 86 QT Interval:  402 QTC Calculation: 427 R Axis:   51 Text Interpretation:  Normal sinus rhythm Normal ECG When compared to prior, new t wave inversion in lead 3.  No STEMI Confirmed by Theda Belfast ([id number]) on 08/14/2017 7:10:50 PM       Radiology Dg Chest 2 View  Result Date: 08/14/2017 CLINICAL DATA:  Chest pain for several days EXAM: CHEST - 2 VIEW COMPARISON:  07/31/2015 FINDINGS: Cardiac shadow is within normal limits. The lungs are well aerated bilaterally. Very mild right middle lobe infiltrate is seen without effusion. No bony abnormality is noted. IMPRESSION: Mild right middle lobe infiltrate. Electronically Signed   By: Alcide Clever M.D.   On: 08/14/2017 14:06    Procedures Procedures (including  critical care time)  Medications Ordered in ED Medications  sodium chloride 0.9 % bolus 1,000 mL (1,000 mLs Intravenous New Bag/Given 08/14/17 1716)  ketorolac (TORADOL) 30 MG/ML injection 30 mg (30 mg Intravenous Given 08/14/17 1716)  azithromycin (ZITHROMAX) tablet 500 mg (500 mg Oral Given 08/14/17 1717)     Initial Impression / Assessment and Plan / ED Course  I have reviewed the triage vital signs and the nursing notes.  Pertinent labs & imaging results that were available during my care of the patient were reviewed by me and considered in my medical decision making (see chart for details).     Patient presents to ED for evaluation of centralized chest pain, feelings of lightheadedness for the past 3 days.  She was recovering from URI symptoms approximately 1.5 weeks ago.  She denies any hemoptysis, recent surgeries, recent prolonged travel,  prior DVT or PE, prior MI, shortness of breath, fevers, OCP use.  On physical exam she is overall well-appearing.  Her chest pain was reproducible with palpation in the center of her chest.  She is satting at 100% on room air without difficulty.  She is not tachycardic or tachypneic.  She is afebrile here with no history of fever. EKG shows normal sinus rhythm with new T wave inversion in lead III which was not evident on previous tracings.  Troponin, CBC, BMP, urine pregnancy all unremarkable.  Chest x-ray shows right middle lobe infiltrate which is most likely the cause of her symptoms.  Delta troponin negative, which was obtained due to EKG change.  Patient given Toradol, first dose of azithromycin, fluids here with improvement in her symptoms.  She has been ambulatory with improvement in her dizziness.  Will send home with remainder of azithromycin course, naproxen to help with chest wall pain.  Portions of this note were generated with Scientist, clinical (histocompatibility and immunogenetics)Dragon dictation software. Dictation errors may occur despite best attempts at proofreading.   Final Clinical  Impressions(s) / ED Diagnoses   Final diagnoses:  Community acquired pneumonia of right lung, unspecified part of lung    ED Discharge Orders        Ordered    azithromycin (ZITHROMAX) 250 MG tablet  Daily     08/14/17 1915    naproxen (NAPROSYN) 500 MG tablet  2 times daily     08/14/17 1915       Dietrich PatesKhatri, Kamaal Cast, [place] JerseyPA-C 08/14/17 2016    Tegeler, Canary Brimhristopher J, MD 08/14/17 [phone]

## 2017-08-14 NOTE — ED Notes (Signed)
Pt discharged to home NAD.  

## 2017-08-14 NOTE — Discharge Instructions (Addendum)
Please be attached information regarding your condition. Both of your troponins today were negative. Take azithromycin once daily beginning tomorrow for the next 4 days.  Please complete the entire course of this medication regardless of symptom improvement. Take naproxen as needed for pain. Stay hydrated and follow-up with primary care provider for further evaluation. Return to ED for worsening symptoms, worsening chest pain or shortness of breath, coughing up blood, vomiting up blood.

## 2017-08-14 NOTE — ED Notes (Signed)
ED Provider at bedside. 

## 2017-08-14 NOTE — Progress Notes (Signed)
[facility] [address], [address] [place], [place] [address] [phone]  Date:  08/14/2017   Name:  Melanie Stout   DOB:  [birth year]   MRN:  [id number]  PCP:  Pearline Cables, MD    Chief Complaint: Chest Pain (c/o constant chest pressure and neck pain that started 3 days ago, dizziness started today. )   History of Present Illness:  Melanie Stout is a 32 y.o. very pleasant female patient who presents with the following:  History of obesity Last seen here in December wen we set her up for OSA testing - neurology called her several times but never were able to connect with her   She is here today with "a constant chest pain for 3 days," and she feels a bit dizzy She feels like "I would not say I'm short of breath but I'm not breathing normally because of the pain in my chest"  She notes a sore throat for 2 days  No fever No cough- she did have a cough last week but now resolved The CP is constant. Does not get better or worse in any situation  Not worse with exertion Not better with rest   She has had a few EKG, but no stress test   No family history of CAD Pt does not smoke She is not on OCP or any other hormones  Patient Active Problem List   Diagnosis Date Noted  . Obesity, unspecified obesity severity, unspecified obesity type 08/01/2016  . Tobacco use disorder 06/05/2016    Past Medical History:  Diagnosis Date  . Abnormal Pap smear   . Headache(784.0)   . Hx of varicella   . PIH (pregnancy induced hypertension)     Past Surgical History:  Procedure Laterality Date  . NOSE SURGERY      Social History   Tobacco Use  . Smoking status: Former Smoker    Packs/day: 0.50    Last attempt to quit: 07/24/2016    Years since quitting: 1.0  . Smokeless tobacco: Never Used  Substance Use Topics  . Alcohol use: Yes    Comment: occ  . Drug use: No    Family History  Problem  Relation Age of Onset  . Hypertension Mother   . Parkinson's disease Father   . Diabetes Maternal Aunt     Allergies  Allergen Reactions  . Fluconazole Other (See Comments)    Feels like skin on face was crawling     Medication list has been reviewed and updated.  Current Outpatient Medications on File Prior to Visit  Medication Sig Dispense Refill  . ketorolac (TORADOL) 10 MG tablet Take 1 tablet (10 mg total) every 6 (six) hours as needed by mouth. 30 tablet 1   No current facility-administered medications on file prior to visit.     Review of Systems:  As per HPI- otherwise negative.   Physical Examination: Vitals:   08/14/17 1255  BP: (!) 142/94  Pulse: 68  Temp: 98.9 F (37.2 C)  SpO2: 99%   Vitals:   08/14/17 1255  Weight: 271 lb 9.6 oz (123.2 kg)  Height: 5\' 6"  (1.676 m)   Body mass index is 43.84 kg/m. Ideal Body Weight: Weight in (lb) to have BMI = 25: 154.6  GEN: WDWN, NAD, Non-toxic, A & O x 3 HEENT: Atraumatic, Normocephalic. Neck supple. No masses, No LAD.  Bilateral TM wnl, oropharynx erythematous but no  exudate.  PEERL,EOMI.   Ears and Nose: No external deformity. CV: RRR, No M/G/R. No JVD. No thrill. No extra heart sounds. PULM: CTA B, no wheezes, crackles, rhonchi. No retractions. No resp. distress. No accessory muscle use. ABD: S, NT, ND. No rebound. No HSM. EXTR: No c/c/e NEURO Normal gait.  PSYCH: Normally interactive. Conversant. Not depressed or anxious appearing.  Calm demeanor.  Obese, looks well   Given a GI cocktail- did NOT help  Results for orders placed or performed in visit on 08/14/17  POCT rapid strep A  Result Value Ref Range   Rapid Strep A Screen Negative Negative     EKG: NSR, comparing to pervious no significant change noted  Assessment and Plan: Chest pressure - Plan: gi cocktail (Maalox,Lidocaine,Donnatal), EKG 12-Lead, CANCELED: EKG 12-Lead  Sore throat - Plan: POCT rapid strep A, gi cocktail  (Maalox,Lidocaine,Donnatal)  Cest pain for 3 days- she had called our call center and was told to come in to clinic instead of going to the ER.    Here today with chest pressure, radiating into her neck, for about 3 days She has noted a ST but not other signs of illness right now GI cocktail not helpful, strep negative. EKG non acute and VS ok  CP pain persists.  Referral to the ER, pt walked to the ER by staff   Signed Abbe AmsterdamJessica Copland, MD

## 2017-08-14 NOTE — ED Triage Notes (Signed)
Pt c/o centralized chest pain and dizziness x 3 days. Sent from PCP for further eval. Pt given GI cocktail without relief

## 2017-08-14 NOTE — ED Notes (Signed)
PT WALKED TO THE BATHROOM AND BACK TO HER ROOM WITHOUT ANY DIFFICULTIES.

## 2017-08-18 ENCOUNTER — Ambulatory Visit: Payer: Self-pay | Admitting: Family Medicine

## 2017-08-19 ENCOUNTER — Ambulatory Visit: Payer: Self-pay | Admitting: *Deleted

## 2017-08-19 NOTE — Progress Notes (Signed)
[facility] [address], [address] [place], [place] [address] [phone]  Date:  08/20/2017   Name:  Melanie Stout   DOB:  [birth year]   MRN:  [id number]  PCP:  Pearline Cables, MD    Chief Complaint: Hospitalization Follow-up (Pt here for hosp f/u. Pt reports still having chest pressure. )   History of Present Illness:  Melanie Stout is a 32 y.o. very pleasant female patient who presents with the following:  Following up from ER visit for CAP 3/14. She was seen in the office that day with c/o chest pressure and was referred to the ER when they dx CAP  Patient presents to ED for evaluation of centralized chest pain, feelings of lightheadedness for the past 3 days.  She was recovering from URI symptoms approximately 1.5 weeks ago.  She denies any hemoptysis, recent surgeries, recent prolonged travel, prior DVT or PE, prior MI, shortness of breath, fevers, OCP use.  On physical exam she is overall well-appearing.  Her chest pain was reproducible with palpation in the center of her chest.  She is satting at 100% on room air without difficulty.  She is not tachycardic or tachypneic.  She is afebrile here with no history of fever. EKG shows normal sinus rhythm with new T wave inversion in lead III which was not evident on previous tracings.  Troponin, CBC, BMP, urine pregnancy all unremarkable.  Chest x-ray shows right middle lobe infiltrate which is most likely the cause of her symptoms.  Delta troponin negative, which was obtained due to EKG change.  Patient given Toradol, first dose of azithromycin, fluids here with improvement in her symptoms.  She has been ambulatory with improvement in her dizziness.  Will send home with remainder of azithromycin course, naproxen to help with chest wall pain.  Dg Chest 2 View  Result Date: 08/14/2017 CLINICAL DATA:  Chest pain for several days EXAM: CHEST - 2 VIEW COMPARISON:   07/31/2015 FINDINGS: Cardiac shadow is within normal limits. The lungs are well aerated bilaterally. Very mild right middle lobe infiltrate is seen without effusion. No bony abnormality is noted. IMPRESSION: Mild right middle lobe infiltrate. Electronically Signed   By: Alcide Clever M.D.   On: 08/14/2017 14:06    Former smoker She still feels like her chest is not back to normal She is not really coughing- "very little" No fever noted  She still fees a bit like there is a pressure on her chest She is not SOB however except with any exertion- such as climbing stairs. She notes that her exercise tolerance is not normal for her right now- it is reduced  She is not on hormones No family history of blood clots   Negative HCG last week Her LMP was 2 months ago   Patient Active Problem List   Diagnosis Date Noted  . Obesity, unspecified obesity severity, unspecified obesity type 08/01/2016  . Tobacco use disorder 06/05/2016    Past Medical History:  Diagnosis Date  . Abnormal Pap smear   . Headache(784.0)   . Hx of varicella   . PIH (pregnancy induced hypertension)     Past Surgical History:  Procedure Laterality Date  . NOSE SURGERY      Social History   Tobacco Use  . Smoking status: Former Smoker    Packs/day: 0.50    Last attempt to quit: 07/24/2016    Years since quitting: 1.0  .  Smokeless tobacco: Never Used  Substance Use Topics  . Alcohol use: Yes    Comment: occ  . Drug use: No    Family History  Problem Relation Age of Onset  . Hypertension Mother   . Parkinson's disease Father   . Diabetes Maternal Aunt     Allergies  Allergen Reactions  . Fluconazole Other (See Comments)    Feels like skin on face was crawling     Medication list has been reviewed and updated.  Current Outpatient Medications on File Prior to Visit  Medication Sig Dispense Refill  . ketorolac (TORADOL) 10 MG tablet Take 1 tablet (10 mg total) every 6 (six) hours as needed by mouth.  30 tablet 1  . naproxen (NAPROSYN) 500 MG tablet Take 1 tablet (500 mg total) by mouth 2 (two) times daily. 30 tablet 0   No current facility-administered medications on file prior to visit.     Review of Systems:    Physical Examination: Vitals:   08/20/17 1125  BP: 126/88  Pulse: 75  Temp: 98.3 F (36.8 C)  SpO2: 97%   Vitals:   08/20/17 1125  Weight: 268 lb 12.8 oz (121.9 kg)  Height: 5\' 6"  (1.676 m)   Body mass index is 43.39 kg/m. Ideal Body Weight: Weight in (lb) to have BMI = 25: 154.6  GEN: WDWN, NAD, Non-toxic, A & O x 3, obese, looks well  HEENT: Atraumatic, Normocephalic. Neck supple. No masses, No LAD. Bilateral TM wnl, oropharynx normal.  PEERL,EOMI.   Ears and Nose: No external deformity. CV: RRR, No M/G/R. No JVD. No thrill. No extra heart sounds. PULM: CTA B, no wheezes, crackles, rhonchi. No retractions. No resp. distress. No accessory muscle use. ABD: S, NT, ND EXTR: No c/c/e NEURO Normal gait.  PSYCH: Normally interactive. Conversant. Not depressed or anxious appearing.  Calm demeanor.    Assessment and Plan: Hospital discharge follow-up  Community acquired pneumonia, unspecified laterality  Shortness of breath - Plan: POCT urine pregnancy, D-Dimer, Quantitative seen in the ER lat week with CP and SOB Ruled out for CAD, noted to have CAP on CXR She has completed azithromycin Worried as she still feels SOB- likely due to recent CAP, however PE has not yet been eliminated Discussed with pt- will get a D dimer, she understands this may lead to a CT and wishes to proceed   Signed Abbe AmsterdamJessica Zak Gondek, MD  Received her labs  Results for orders placed or performed in visit on 08/20/17  D-Dimer, Quantitative  Result Value Ref Range   D-Dimer, Quant <0.19 <0.50 mcg/mL FEU  POCT urine pregnancy  Result Value Ref Range   Preg Test, Ur Negative Negative   Negative D dimer- called pt and let her know.  She is relieved, will let me know if not getting  better int the next several days

## 2017-08-19 NOTE — Telephone Encounter (Signed)
Pt  Reports  Some  Dull  Chest  Pain  Since  She  Was  Seen  For  pnuemonia    5  Days  Ago .  She  States  It  Is   Somewhat  Better   She  Denies  Any  Shortness  Of  Breath. She  Is  Speaking in  Complete sentences  And   Seems  In no  severe  Distress . She  Has  Finished  Her  Short  Course  Of  Anti  Biotics .Pt scheduled  For  30  Minute  Hospital appointment  With dr  copland  Tomorrow  Advised  To   Call  [phone]  If  Chest pain  Reoccurs  .   Reason for Disposition . [1] Chest pain lasts > 5 minutes AND [2] occurred > 3 days ago (72 hours) AND [3] NO chest pain or cardiac symptoms now  Answer Assessment - Initial Assessment Questions 1. LOCATION: "Where does it hurt?"        CENTER  OF  CHEST   2. RADIATION: "Does the pain go anywhere else?" (e.g., into neck, jaw, arms, back)      NO 3. ONSET: "When did the chest pain begin?" (Minutes, hours or days)        8  DAYS    4. PATTERN "Does the pain come and go, or has it been constant since it started?"  "Does it get worse with exertion?"          CONSTANT    5. DURATION: "How long does it last" (e.g., seconds, minutes, hours)          STAYS  CONSTANT  6. SEVERITY: "How bad is the pain?"  (e.g., Scale 1-10; mild, moderate, or severe)    - MILD (1-3): doesn't interfere with normal activities     - MODERATE (4-7): interferes with normal activities or awakens from sleep    - SEVERE (8-10): excruciating pain, unable to do any normal activities        4    7. CARDIAC RISK FACTORS: "Do you have any history of heart problems or risk factors for heart disease?" (e.g., prior heart attack, angina; high blood pressure, diabetes, being overweight, high cholesterol, smoking, or strong family history of heart disease)        FORMER SMOKER   HIGH  CHOLESTEROL    8. PULMONARY RISK FACTORS: "Do you have any history of lung disease?"  (e.g., blood clots in lung, asthma, emphysema, birth control pills)         NO 9. CAUSE: "What do you think is causing the  chest pain?"          POSSIBLE  THE  PNUEMONIA   10. OTHER SYMPTOMS: "Do you have any other symptoms?" (e.g., dizziness, nausea, vomiting, sweating, fever, difficulty breathing, cough)           THE  BREATHING  IS  HEAVIER THAN  NORMAL     11. PREGNANCY: "Is there any chance you are pregnant?" "When was your last menstrual period?"           2  MONTH   TYPICALLY  IRREG   DOES  NOT THINK  SHE  SHE  IS  PREGANT  BUT  NOT  100  PER  CENT  SURE  Protocols used: CHEST PAIN-A-AH

## 2017-08-20 ENCOUNTER — Ambulatory Visit (INDEPENDENT_AMBULATORY_CARE_PROVIDER_SITE_OTHER): Payer: 59 | Admitting: Family Medicine

## 2017-08-20 ENCOUNTER — Encounter: Payer: Self-pay | Admitting: Family Medicine

## 2017-08-20 VITALS — BP 126/88 | HR 75 | Temp 98.3°F | Ht 66.0 in | Wt 268.8 lb

## 2017-08-20 DIAGNOSIS — J189 Pneumonia, unspecified organism: Secondary | ICD-10-CM

## 2017-08-20 DIAGNOSIS — R0602 Shortness of breath: Secondary | ICD-10-CM | POA: Diagnosis not present

## 2017-08-20 DIAGNOSIS — Z09 Encounter for follow-up examination after completed treatment for conditions other than malignant neoplasm: Secondary | ICD-10-CM | POA: Diagnosis not present

## 2017-08-20 LAB — D-DIMER, QUANTITATIVE (NOT AT ARMC)

## 2017-08-20 LAB — POCT URINE PREGNANCY: PREG TEST UR: NEGATIVE

## 2017-08-20 NOTE — Patient Instructions (Signed)
We are getting a D dimer test for you today to help us determine if you need a CT scan for possible pulmonary embolus.  I will be in touch with this result later today

## 2017-09-01 ENCOUNTER — Encounter: Payer: Self-pay | Admitting: Family Medicine

## 2017-09-01 DIAGNOSIS — R1310 Dysphagia, unspecified: Secondary | ICD-10-CM

## 2017-09-01 MED ORDER — SUCRALFATE 1 G PO TABS: 1.0000 g | ORAL_TABLET | Freq: Three times a day (TID) | ORAL | 0 refills | Status: DC

## 2017-09-01 NOTE — Telephone Encounter (Signed)
Was concerned about her mychart message and called her See recent notes- she was seen in the ER with chest pain, ruled out for CAD and noted to have CAP,.  She saw me in the office about a week later with continued c/o SOB so we got a d dimer as well, negative  She notes that she has a heavy feeling in her chest, and also feels like something is stuck in her throat/ irritation of the esophagus. Advised that while this sounds like it may well be esophageal, I would still advise her that the most conservative move is to seek emergency evaluation at the ER.  She takes this under advisement but declines to go to the ER for the time being.  I will rx carafate for her and refer to GI as she may need an UGI scope

## 2017-09-02 ENCOUNTER — Encounter: Payer: Self-pay | Admitting: Gastroenterology

## 2017-10-07 ENCOUNTER — Ambulatory Visit: Payer: 59 | Admitting: Gastroenterology

## 2017-11-14 ENCOUNTER — Encounter: Payer: Self-pay | Admitting: Gastroenterology

## 2017-11-14 ENCOUNTER — Ambulatory Visit (INDEPENDENT_AMBULATORY_CARE_PROVIDER_SITE_OTHER): Payer: 59 | Admitting: Gastroenterology

## 2017-11-14 VITALS — BP 104/80 | HR 72 | Ht 66.0 in | Wt 275.2 lb

## 2017-11-14 DIAGNOSIS — R0789 Other chest pain: Secondary | ICD-10-CM

## 2017-11-14 DIAGNOSIS — R1310 Dysphagia, unspecified: Secondary | ICD-10-CM

## 2017-11-14 MED ORDER — DEXLANSOPRAZOLE 60 MG PO CPDR: 60.0000 mg | DELAYED_RELEASE_CAPSULE | Freq: Every day | ORAL | 0 refills | Status: DC

## 2017-11-14 NOTE — Patient Instructions (Addendum)
If you are age 665 or older, your body mass index should be between 23-30. Your Body mass index is 44.43 kg/m. If this is out of the aforementioned range listed, please consider follow up with your Primary Care Provider.  If you are age 33 or younger, your body mass index should be between 19-25. Your Body mass index is 44.43 kg/m. If this is out of the aformentioned range listed, please consider follow up with your Primary Care Provider.   We have given you samples of the following medication to take: Dexilant 60 mg daily x 2 weeks.  Please purchase the following medications over the counter and take as directed: Zyrtec 10 mg daily.   You have been scheduled for a Barium Esophogram at [facility] (1st floor of the hospital) on 11/25/17 at 10:30am. Please arrive 15 minutes prior to your appointment for registration. Make certain not to have anything to eat or drink 6 hours prior to your test. If you need to reschedule for any reason, please contact radiology at [phone] to do so. __________________________________________________________________ A barium swallow is an examination that concentrates on views of the esophagus. This tends to be a double contrast exam (barium and two liquids which, when combined, create a gas to distend the wall of the oesophagus) or single contrast (non-ionic iodine based). The study is usually tailored to your symptoms so a good history is essential. Attention is paid during the study to the form, structure and configuration of the esophagus, looking for functional disorders (such as aspiration, dysphagia, achalasia, motility and reflux) EXAMINATION You may be asked to change into a gown, depending on the type of swallow being performed. A radiologist and radiographer will perform the procedure. The radiologist will advise you of the type of contrast selected for your procedure and direct you during the exam. You will be asked to stand, sit or lie in  several different positions and to hold a small amount of fluid in your mouth before being asked to swallow while the imaging is performed .In some instances you may be asked to swallow barium coated marshmallows to assess the motility of a solid food bolus. The exam can be recorded as a digital or video fluoroscopy procedure. POST PROCEDURE It will take 1-2 days for the barium to pass through your system. To facilitate this, it is important, unless otherwise directed, to increase your fluids for the next 24-48hrs and to resume your normal diet.  This test typically takes about 30 minutes to perform. ______________________________________________________________________________  Bonita QuinYou have been scheduled for an endoscopy. Please follow written instructions given to you at your visit today. If you use inhalers (even only as needed), please bring them with you on the day of your procedure. Your physician has requested that you go to www.startemmi.com and enter the access code given to you at your visit today. This web site gives a general overview about your procedure. However, you should still follow specific instructions given to you by our office regarding your preparation for the procedure.  Thank you,  Dr. Lynann Bolognaajesh Gupta

## 2017-11-14 NOTE — Progress Notes (Signed)
Chief Complaint: Dysphagia  Referring Provider:  Pearline Cables, MD      ASSESSMENT AND PLAN;   #1. Esophageal Dysphagia with sensation of lump in throat (globus sensation). No thrush on examination.  #2. Noncardiac chest pains.  Plan: - Trial of Dexilant 60mg  po qd x 2 weeks (samples given) - Ba swallow with barium tablet. -Thereafter, proceed with EGD with possible esophageal dilatation and esophageal biopsies. - If still with problems despite of above, would recommend ENT re-consultation by Dr Verne Spurr for possible nasopharyngoscopy. - Zyrtec  10 mg p.o. nightly for 2 weeks. -Stop all nonsteroidals.   HPI:    Melanie Stout is a 32 y.o. female  Has been having problems with intermittent solid food dysphagia and a lump in the throat for the last 3 months ever since she has been treated for pneumonia with azithromycin. She does complain of some whitish phlegm. No fever or chills Has occasional heartburn Has been having problems with allergies recently Was seen by ENT approximately a year ago when she underwent Sino plasty. No odynophagia Denies having any significant nausea vomiting regurgitation. Denies having any diarrhea or constipation. Denies having any significant abdominal pain No recent weight loss. Had noncardiac chest pains with negative EKG.   Past Medical History:  Diagnosis Date  . Abnormal Pap smear   . Headache(784.0)   . Hx of varicella   . PIH (pregnancy induced hypertension)     Past Surgical History:  Procedure Laterality Date  . NOSE SURGERY      Family History  Problem Relation Age of Onset  . Hypertension Mother   . Parkinson's disease Father   . Diabetes Maternal Aunt     Social History   Tobacco Use  . Smoking status: Former Smoker    Packs/day: 0.50    Last attempt to quit: 07/24/2016    Years since quitting: 1.3  . Smokeless tobacco: Never Used  Substance Use Topics  . Alcohol use: Yes    Comment: occ    . Drug use: No    Current Outpatient Medications  Medication Sig Dispense Refill  . ketorolac (TORADOL) 10 MG tablet Take 1 tablet (10 mg total) every 6 (six) hours as needed by mouth. 30 tablet 1  . naproxen (NAPROSYN) 500 MG tablet Take 1 tablet (500 mg total) by mouth 2 (two) times daily. 30 tablet 0  . sucralfate (CARAFATE) 1 g tablet Take 1 tablet (1 g total) by mouth 4 (four) times daily -  with meals and at bedtime. 40 tablet 0   No current facility-administered medications for this visit.     Allergies  Allergen Reactions  . Fluconazole Other (See Comments)    Feels like skin on face was crawling     Review of Systems:  Constitutional: Denies fever, chills, diaphoresis, appetite change and fatigue.  HEENT: Denies photophobia, eye pain, redness, hearing loss, ear pain, congestion, sore throat, rhinorrhea, sneezing, mouth sores, neck pain, neck stiffness and tinnitus.   Respiratory: Denies SOB, DOE, cough, chest tightness,  and wheezing.   Cardiovascular: Denies palpitations and leg swelling.  Genitourinary: Denies dysuria, urgency, frequency, hematuria, flank pain and difficulty urinating.  Musculoskeletal: Denies myalgias, back pain, joint swelling, arthralgias and gait problem.  Skin: No rash.  Neurological: Denies dizziness, seizures, syncope, weakness, light-headedness, numbness. has headaches.  Hematological: Denies adenopathy. Easy bruising, personal or family bleeding history  Psychiatric/Behavioral: Has anxiety, no depression     Physical Exam:    BP 104/80  Pulse 72   Ht 5\' 6"  (1.676 m)   Wt 275 lb 4 oz (124.9 kg)   BMI 44.43 kg/m  Filed Weights   11/14/17 1516  Weight: 275 lb 4 oz (124.9 kg)   Constitutional:  Well-developed, in no acute distress. Psychiatric: Normal mood and affect. Behavior is normal. HEENT: Pupils normal.  Conjunctivae are normal. No scleral icterus. Has postnasal drip Neck supple.  Cardiovascular: Normal rate, regular rhythm. No  edema Pulmonary/chest: Effort normal and breath sounds normal. No wheezing, rales or rhonchi. Abdominal: Soft, nondistended. Nontender. Bowel sounds active throughout. There are no masses palpable. No hepatomegaly. Rectal:  defered Neurological: Alert and oriented to person place and time. Skin: Skin is warm and dry. No rashes noted.  Data Reviewed: I have personally reviewed following labs and imaging studies  CBC: CBC Latest Ref Rng & Units 08/14/2017 05/22/2017 07/23/2016  WBC 4.0 - 10.5 K/uL 6.9 8.3 7.7  Hemoglobin 12.0 - 15.0 g/dL 64.313.6 32.912.7 51.813.1  Hematocrit 36.0 - 46.0 % 39.8 39.9 39.2  Platelets 150 - 400 K/uL 294 287.0 292    CMP: CMP Latest Ref Rng & Units 08/14/2017 05/22/2017 07/23/2016  Glucose 65 - 99 mg/dL 81 86 75  BUN 6 - 20 mg/dL 15 18 15   Creatinine 0.44 - 1.00 mg/dL 8.410.60 6.600.80 6.300.72  Sodium 135 - 145 mmol/L 134(L) 137 136  Potassium 3.5 - 5.1 mmol/L 4.0 4.2 3.9  Chloride 101 - 111 mmol/L 105 107 106  CO2 22 - 32 mmol/L 22 27 26   Calcium 8.9 - 10.3 mg/dL 9.0 8.4 1.6(W8.4(L)  Total Protein 6.0 - 8.3 g/dL - 6.4 6.7  Total Bilirubin 0.2 - 1.2 mg/dL - 0.2 0.4  Alkaline Phos 39 - 117 U/L - 58 58  AST 0 - 37 U/L - 15 20  ALT 0 - 35 U/L - 23 16     Melanie Circleaj Jani Moronta, MD 11/14/2017, 3:55 PM  Cc: Copland, Gwenlyn FoundJessica C, MD

## 2017-11-20 ENCOUNTER — Telehealth: Payer: Self-pay | Admitting: Gastroenterology

## 2017-11-20 MED ORDER — DEXLANSOPRAZOLE 60 MG PO CPDR: 60.0000 mg | DELAYED_RELEASE_CAPSULE | Freq: Every day | ORAL | 3 refills | Status: DC

## 2017-11-20 NOTE — Telephone Encounter (Signed)
Sent refills to patients pharmacy.  

## 2017-11-25 ENCOUNTER — Encounter (HOSPITAL_COMMUNITY): Payer: Self-pay

## 2017-11-25 ENCOUNTER — Ambulatory Visit (HOSPITAL_COMMUNITY)
Admission: RE | Admit: 2017-11-25 | Discharge: 2017-11-25 | Disposition: A | Discharge status: Home / Self Care | Payer: 59 | Source: Ambulatory Visit | Attending: Gastroenterology | Admitting: Gastroenterology

## 2017-11-25 DIAGNOSIS — R0789 Other chest pain: Secondary | ICD-10-CM

## 2017-11-25 DIAGNOSIS — R1310 Dysphagia, unspecified: Secondary | ICD-10-CM

## 2017-11-26 ENCOUNTER — Other Ambulatory Visit: Payer: Self-pay | Admitting: Diagnostic Radiology

## 2017-11-26 ENCOUNTER — Ambulatory Visit (HOSPITAL_COMMUNITY)
Admission: RE | Admit: 2017-11-26 | Discharge: 2017-11-26 | Disposition: A | Discharge status: Home / Self Care | Payer: 59 | Source: Ambulatory Visit | Attending: Gastroenterology | Admitting: Gastroenterology

## 2017-11-26 DIAGNOSIS — R0789 Other chest pain: Secondary | ICD-10-CM | POA: Diagnosis present

## 2017-11-26 DIAGNOSIS — R1310 Dysphagia, unspecified: Secondary | ICD-10-CM | POA: Insufficient documentation

## 2017-11-26 LAB — HCG, QUANTITATIVE, PREGNANCY: hCG, Beta Chain, Quant, S: <1 m[IU]/mL (ref <5)

## 2017-11-28 ENCOUNTER — Encounter: Payer: Self-pay | Admitting: Gastroenterology

## 2017-12-08 ENCOUNTER — Encounter: Payer: Self-pay | Admitting: Gastroenterology

## 2017-12-08 ENCOUNTER — Other Ambulatory Visit: Payer: Self-pay

## 2017-12-08 ENCOUNTER — Ambulatory Visit (AMBULATORY_SURGERY_CENTER): Payer: 59 | Admitting: Gastroenterology

## 2017-12-08 VITALS — BP 118/75 | HR 65 | Temp 97.7°F | Resp 16 | Ht 66.0 in | Wt 275.0 lb

## 2017-12-08 DIAGNOSIS — R1310 Dysphagia, unspecified: Secondary | ICD-10-CM

## 2017-12-08 DIAGNOSIS — K2990 Gastroduodenitis, unspecified, without bleeding: Secondary | ICD-10-CM | POA: Diagnosis not present

## 2017-12-08 DIAGNOSIS — K2970 Gastritis, unspecified, without bleeding: Secondary | ICD-10-CM | POA: Diagnosis not present

## 2017-12-08 MED ORDER — SODIUM CHLORIDE 0.9 % IV SOLN: 500.0000 mL | Freq: Once | INTRAVENOUS | Status: DC

## 2017-12-08 NOTE — Progress Notes (Signed)
History reviewed today 

## 2017-12-08 NOTE — Progress Notes (Signed)
Called to room to assist during endoscopic procedure.  Patient ID and intended procedure confirmed with present staff. Received instructions for my participation in the procedure from the performing physician.  

## 2017-12-08 NOTE — Patient Instructions (Signed)
Soft diet today. Information attached.   YOU HAD AN ENDOSCOPIC PROCEDURE TODAY AT THE [facility]:   Refer to the procedure report that was given to you for any specific questions about what was found during the examination.  If the procedure report does not answer your questions, please call your gastroenterologist to clarify.  If you requested that your care partner not be given the details of your procedure findings, then the procedure report has been included in a sealed envelope for you to review at your convenience later.  YOU SHOULD EXPECT: Some feelings of bloating in the abdomen. Passage of more gas than usual.  Walking can help get rid of the air that was put into your GI tract during the procedure and reduce the bloating. If you had a lower endoscopy (such as a colonoscopy or flexible sigmoidoscopy) you may notice spotting of blood in your stool or on the toilet paper. If you underwent a bowel prep for your procedure, you may not have a normal bowel movement for a few days.  Please Note:  You might notice some irritation and congestion in your nose or some drainage.  This is from the oxygen used during your procedure.  There is no need for concern and it should clear up in a day or so.  SYMPTOMS TO REPORT IMMEDIATELY:   Following upper endoscopy (EGD)  Vomiting of blood or coffee ground material  New chest pain or pain under the shoulder blades  Painful or persistently difficult swallowing  New shortness of breath  Fever of 100F or higher  Black, tarry-looking stools  For urgent or emergent issues, a gastroenterologist can be reached at any hour by calling [phone].   DIET:  We do recommend a small meal at first, but then you may proceed to your regular diet.  Drink plenty of fluids but you should avoid alcoholic beverages for 24 hours.  ACTIVITY:  You should plan to take it easy for the rest of today and you should NOT DRIVE or use heavy machinery until  tomorrow (because of the sedation medicines used during the test).    FOLLOW UP: Our staff will call the number listed on your records the next business day following your procedure to check on you and address any questions or concerns that you may have regarding the information given to you following your procedure. If we do not reach you, we will leave a message.  However, if you are feeling well and you are not experiencing any problems, there is no need to return our call.  We will assume that you have returned to your regular daily activities without incident.  If any biopsies were taken you will be contacted by phone or by letter within the next 1-3 weeks.  Please call us at [phone] if you have not heard about the biopsies in 3 weeks.    SIGNATURES/CONFIDENTIALITY: You and/or your care partner have signed paperwork which will be entered into your electronic medical record.  These signatures attest to the fact that that the information above on your After Visit Summary has been reviewed and is understood.  Full responsibility of the confidentiality of this discharge information lies with you and/or your care-partner.

## 2017-12-08 NOTE — Progress Notes (Signed)
Report given to PACU, vss 

## 2017-12-08 NOTE — Op Note (Signed)
[facility] Patient Name: Melanie Stout Procedure Date: 12/08/2017 11:28 AM MRN: [id number] Endoscopist: Jackquline Denmark , MD Age: 32 Referring MD:  Date of Birth: [birth year] Gender: Female Account #: [id number] Procedure:                Upper GI endoscopy Indications:              Dysphagia with negative barium swallow. Medicines:                Monitored Anesthesia Care Procedure:                Pre-Anesthesia Assessment:                           - Prior to the procedure, a History and Physical                            was performed, and patient medications and                            allergies were reviewed. The patient is competent.                            The risks and benefits of the procedure and the                            sedation options and risks were discussed with the                            patient. All questions were answered and informed                            consent was obtained. Patient identification and                            proposed procedure were verified by the physician                            in the procedure room. Mental Status Examination:                            alert and oriented. Prophylactic Antibiotics: The                            patient does not require prophylactic antibiotics.                            Prior Anticoagulants: The patient has taken no                            previous anticoagulant or antiplatelet agents. ASA                            Grade Assessment: III - A patient with severe  systemic disease. After reviewing the risks and                            benefits, the patient was deemed in satisfactory                            condition to undergo the procedure. The anesthesia                            plan was to use monitored anesthesia care (MAC).                            Immediately prior to administration of medications,                            the  patient was re-assessed for adequacy to receive                            sedatives. The heart rate, respiratory rate, oxygen                            saturations, blood pressure, adequacy of pulmonary                            ventilation, and response to care were monitored                            throughout the procedure. The physical status of                            the patient was re-assessed after the procedure.                           After obtaining informed consent, the endoscope was                            passed under direct vision. Throughout the                            procedure, the patient's blood pressure, pulse, and                            oxygen saturations were monitored continuously. The                            Endoscope was introduced through the mouth, and                            advanced to the second part of duodenum. The upper                            GI endoscopy was accomplished without difficulty.  The patient tolerated the procedure well. Scope In: Scope Out: Findings:                 No endoscopic abnormality was evident in the                            esophagus to explain the patient's complaint of                            dysphagia. It was decided, however, to proceed with                            dilation of the entire esophagus. The scope was                            withdrawn. Dilation was performed with a Maloney                            dilator with no resistance at 50 Fr. Biopsies were                            obtained from the proximal and distal esophagus                            with cold forceps for histology to r/o eosinophilic                            esophagitis.                           Localized mild inflammation characterized by                            erythema was found in the gastric antrum. Biopsies                            were taken with a cold forceps for  histology.                           The exam was otherwise without abnormality. Complications:            No immediate complications. Estimated Blood Loss:     Estimated blood loss was minimal. Impression:               - No endoscopic esophageal abnormality to explain                            patient's dysphagia. Esophagus Dilated. Biopsied.                           - Gastritis. Biopsied. Recommendation:           - Patient has a contact number available for                            emergencies. The signs and symptoms  of potential                            delayed complications were discussed with the                            patient. Return to normal activities tomorrow.                            Written discharge instructions were provided to the                            patient.                           - Soft diet today. Chew food especially meats and                            eatslowly.                           - Continue present medications - Dexilant 15m po                            qd.                           - Await pathology results.                           - Return to GI clinic PRN. If still with problems,                            would recommend ENT consultation possibly followed                            by esophageal manometry/24 hr ph study if needed. RJackquline Denmark MD 12/08/2017 11:46:23 AM This report has been signed electronically.

## 2017-12-09 ENCOUNTER — Telehealth: Payer: Self-pay | Admitting: *Deleted

## 2017-12-09 NOTE — Telephone Encounter (Signed)
  Follow up Call-  Call back number 12/08/2017  Post procedure Call Back phone  # [phone]  Permission to leave phone message Yes  Some recent data might be hidden     Patient questions:  Do you have a fever, pain , or abdominal swelling? No. Pain Score  0 *  Have you tolerated food without any problems? Yes.    Have you been able to return to your normal activities? Yes.    Do you have any questions about your discharge instructions: Diet   No. Medications  No. Follow up visit  No.  Do you have questions or concerns about your Care? No.  Actions: * If pain score is 4 or above: No action needed, pain <4.

## 2017-12-14 ENCOUNTER — Encounter: Payer: Self-pay | Admitting: Gastroenterology

## 2018-02-10 NOTE — Progress Notes (Signed)
[facility] [address], [address] [place], [place] [address] [phone]  Date:  02/11/2018   Name:  Melanie Stout   DOB:  01/04/1986   MRN:  [id number]  PCP:  Pearline Cables, MD    Chief Complaint: Anxiety (1 month, stress has built up, trouble concentrating, shaky, sweaty, feeling nervous) and Immunizations (declined flu shot)   History of Present Illness:  Melanie Stout is a 32 y.o. very pleasant female patient who presents with the following:  Here today to discuss anxiety I last saw her in May after she was admitted with pneumonia In the meantime she has seen GI for non cardiac CP, she underwent upper GI: #1. Esophageal Dysphagia with sensation of lump in throat (globus sensation). No thrush on examination.  #2. Noncardiac chest pains. Plan: - Trial of Dexilant 60mg  po qd x 2 weeks (samples given) - Ba swallow with barium tablet. -Thereafter, proceed with EGD with possible esophageal dilatation and esophageal biopsies. - If still with problems despite of above, would recommend ENT re-consultation by Dr Verne Spurr for possible nasopharyngoscopy. - Zyrtec  10 mg p.o. nightly for 2 weeks. -Stop all nonsteroidals.  Today she notes that she is under stress and this seems to be causing some anxiety- since March things have been hard for her Her mom got injured at work and has needed her help and support, her brother "has issues and he's putting them on me" She is feeling worried, shaky She may have had some anxiety years ago but was ok until here recently  She did go to see a counselor just recently who recommended that she see a psychiatrist- however this is easier said that done, no appt is available anytime soon   She is feeling a bit down- does not feel like doing a lot, less energy  Her son is in kindergarten, she feels like she is not as involved as she should be in helping him with homework,  etc She may feel overwhelmed, like her mind is racing trying to think of all the things she needs to do and take care of  She feels torn between her brother (who wants to move in with them) and her partner who does not want this  She is not sleeping that well Appetite is not as good as usual No SI   She was on some medication for depression many years ago but she cannot remember what it is that she took  Wt Readings from Last 3 Encounters:  02/11/18 288 lb (130.6 kg)  12/08/17 275 lb (124.7 kg)  11/14/17 275 lb 4 oz (124.9 kg)  she is not pregnant or lactating at this time   Patient Active Problem List   Diagnosis Date Noted  . Obesity, unspecified obesity severity, unspecified obesity type 08/01/2016  . Tobacco use disorder 06/05/2016    Past Medical History:  Diagnosis Date  . Abnormal Pap smear   . Blood transfusion without reported diagnosis 01/05/2013  . Headache(784.0)   . Hx of varicella   . Hyperlipidemia     Past Surgical History:  Procedure Laterality Date  . NOSE SURGERY      Social History   Tobacco Use  . Smoking status: Former Smoker    Packs/day: 0.50    Last attempt to quit: 07/24/2016    Years since quitting: 1.5  . Smokeless tobacco: Never Used  Substance Use Topics  . Alcohol use: Yes  Comment: occ  . Drug use: No    Family History  Problem Relation Age of Onset  . Hypertension Mother   . Parkinson's disease Father   . Diabetes Maternal Aunt     No Active Allergies  Medication list has been reviewed and updated.  Current Outpatient Medications on File Prior to Visit  Medication Sig Dispense Refill  . cetirizine (ZYRTEC) 10 MG tablet Take by mouth.    . dexlansoprazole (DEXILANT) 60 MG capsule Take 1 capsule (60 mg total) by mouth daily. 30 capsule 3   No current facility-administered medications on file prior to visit.     Review of Systems:  As per HPI- otherwise negative. No fever or chills No CP or SOB    Physical  Examination: Vitals:   02/11/18 1455  BP: 122/90  Pulse: 92  Resp: 17  Temp: 98.7 F (37.1 C)  SpO2: 97%   Vitals:   02/11/18 1455  Weight: 288 lb (130.6 kg)  Height: 5\' 6"  (1.676 m)   Body mass index is 46.48 kg/m. Ideal Body Weight: Weight in (lb) to have BMI = 25: 154.6  GEN: WDWN, NAD, Non-toxic, A & O x 3, obese, looks well  HEENT: Atraumatic, Normocephalic. Neck supple. No masses, No LAD.  Bilateral TM wnl, oropharynx normal.  PEERL,EOMI.   Ears and Nose: No external deformity. CV: RRR, No M/G/R. No JVD. No thrill. No extra heart sounds. PULM: CTA B, no wheezes, crackles, rhonchi. No retractions. No resp. distress. No accessory muscle use. EXTR: No c/c/e NEURO Normal gait.  PSYCH: Normally interactive. Conversant. Not depressed or anxious appearing.  Calm demeanor.    Assessment and Plan: Adjustment disorder with mixed anxiety and depressed mood - Plan: FLUoxetine (PROZAC) 20 MG tablet  She is here today with concern of anxiety, some depression Start on prozac 20 mg- will increase to 40 after 2 weeks She is seeing a counselor We hope that her family stressors will decrease soon She will follow-up with me in one month- sooner if not doing ok   Signed Abbe Amsterdam, MD

## 2018-02-11 ENCOUNTER — Encounter: Payer: Self-pay | Admitting: Family Medicine

## 2018-02-11 ENCOUNTER — Ambulatory Visit (INDEPENDENT_AMBULATORY_CARE_PROVIDER_SITE_OTHER): Payer: 59 | Admitting: Family Medicine

## 2018-02-11 VITALS — BP 122/90 | HR 92 | Temp 98.7°F | Resp 17 | Ht 66.0 in | Wt 288.0 lb

## 2018-02-11 DIAGNOSIS — F4323 Adjustment disorder with mixed anxiety and depressed mood: Secondary | ICD-10-CM

## 2018-02-11 MED ORDER — FLUOXETINE HCL 20 MG PO TABS: 20.0000 mg | ORAL_TABLET | Freq: Every day | ORAL | 5 refills | Status: DC

## 2018-02-11 NOTE — Patient Instructions (Signed)
It was good to see you today- I am sorry that things are so tough right now!   Let's have you start on prozac for your anxiety- take 20 mg by mouth daily.  Increase to 2 pills- 40 mg- after about 2 weeks.  Please let me know how this is working for you either here in the office or via mychart in about one month. Let me know sooner if you are not doing ok!

## 2018-02-13 ENCOUNTER — Encounter: Payer: Self-pay | Admitting: Family Medicine

## 2018-02-17 ENCOUNTER — Telehealth: Payer: Self-pay | Admitting: Family Medicine

## 2018-02-17 NOTE — Telephone Encounter (Signed)
Pt dropped off FMLA paperwork. Let her know paperwork takes 5-7 business days. Put in provider bin in front office.

## 2018-02-26 NOTE — Telephone Encounter (Signed)
Completed as much as possible; forwarded to provider with attached OV note/SLS 09/26

## 2018-03-05 NOTE — Telephone Encounter (Signed)
Paperwork has been faxed 02/26/18 with confirmation/SLS

## 2018-04-16 ENCOUNTER — Other Ambulatory Visit: Payer: Self-pay

## 2018-04-16 MED ORDER — DEXLANSOPRAZOLE 60 MG PO CPDR: 60.0000 mg | DELAYED_RELEASE_CAPSULE | Freq: Every day | ORAL | 2 refills | Status: DC

## 2018-04-16 NOTE — Progress Notes (Signed)
Sent new prescription in for 90 days per insurance request.

## 2018-04-20 ENCOUNTER — Encounter: Payer: Self-pay | Admitting: Family Medicine

## 2018-04-21 ENCOUNTER — Telehealth: Payer: Self-pay | Admitting: Family Medicine

## 2018-04-21 NOTE — Telephone Encounter (Signed)
Patient came into the office and dropped off FMLA forms to be completed. She requests the forms be faxed to [phone]. Placed in Copland's tray.

## 2018-04-23 ENCOUNTER — Encounter (HOSPITAL_COMMUNITY): Payer: Self-pay

## 2018-04-23 ENCOUNTER — Other Ambulatory Visit: Payer: Self-pay

## 2018-04-23 ENCOUNTER — Ambulatory Visit (HOSPITAL_COMMUNITY)
Admission: EM | Admit: 2018-04-23 | Discharge: 2018-04-23 | Disposition: A | Discharge status: Home / Self Care | Payer: Self-pay | Attending: Family Medicine | Admitting: Family Medicine

## 2018-04-23 ENCOUNTER — Ambulatory Visit (INDEPENDENT_AMBULATORY_CARE_PROVIDER_SITE_OTHER): Payer: Self-pay

## 2018-04-23 DIAGNOSIS — M25562 Pain in left knee: Secondary | ICD-10-CM

## 2018-04-23 DIAGNOSIS — M545 Low back pain: Secondary | ICD-10-CM

## 2018-04-23 DIAGNOSIS — Z3202 Encounter for pregnancy test, result negative: Secondary | ICD-10-CM

## 2018-04-23 DIAGNOSIS — S161XXA Strain of muscle, fascia and tendon at neck level, initial encounter: Secondary | ICD-10-CM

## 2018-04-23 LAB — POCT PREGNANCY, URINE: Preg Test, Ur: NEGATIVE

## 2018-04-23 MED ORDER — IBUPROFEN 800 MG PO TABS: 800.0000 mg | ORAL_TABLET | Freq: Three times a day (TID) | ORAL | 0 refills | Status: DC

## 2018-04-23 MED ORDER — TIZANIDINE HCL 4 MG PO TABS: 4.0000 mg | ORAL_TABLET | Freq: Four times a day (QID) | ORAL | 0 refills | Status: DC | PRN
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
## 2018-04-23 NOTE — Discharge Instructions (Signed)
Take ibuprofen 3 x a day with food Take the full relaxer as needed.  This is useful at night. Use ice or heat to the painful muscles Activity as tolerated.  Avoid heavy lifting. Follow-up with your primary care physician if not improving by next week
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
## 2018-04-27 ENCOUNTER — Ambulatory Visit (INDEPENDENT_AMBULATORY_CARE_PROVIDER_SITE_OTHER): Payer: 59 | Admitting: Family Medicine

## 2018-04-27 ENCOUNTER — Ambulatory Visit (HOSPITAL_BASED_OUTPATIENT_CLINIC_OR_DEPARTMENT_OTHER)
Admission: RE | Admit: 2018-04-27 | Discharge: 2018-04-27 | Disposition: A | Discharge status: Home / Self Care | Payer: 59 | Source: Ambulatory Visit | Attending: Family Medicine | Admitting: Family Medicine

## 2018-04-27 ENCOUNTER — Encounter: Payer: Self-pay | Admitting: Family Medicine

## 2018-04-27 VITALS — BP 136/88 | HR 71 | Temp 98.4°F | Resp 18 | Ht 66.0 in | Wt 298.0 lb

## 2018-04-27 DIAGNOSIS — F439 Reaction to severe stress, unspecified: Secondary | ICD-10-CM

## 2018-04-27 DIAGNOSIS — S060X0A Concussion without loss of consciousness, initial encounter: Secondary | ICD-10-CM

## 2018-04-27 DIAGNOSIS — G44319 Acute post-traumatic headache, not intractable: Secondary | ICD-10-CM | POA: Diagnosis not present

## 2018-04-27 DIAGNOSIS — N926 Irregular menstruation, unspecified: Secondary | ICD-10-CM

## 2018-04-27 LAB — POCT URINE PREGNANCY: Preg Test, Ur: NEGATIVE

## 2018-04-27 NOTE — Patient Instructions (Signed)
Good to see you today, but I am sorry that you are having such a hard time.  We will work on your leave paperwork for your job You have symptoms of a concussion.  These can include headache, fatigue, feeling mentally slow, feeling irritable or sad, nausea, and dizziness. We will do a CT of your head to make sure there is no sign of anything more serious going on and I will report this result to you later today Rest- napping, plenty of sleep at night- will help So will avoiding "brain strain" - do as little computer work/ reading as you can.  As you get better you can increase these activities, but if your concussion symptoms return you are doing too much  Please alert me if your symptoms are not improving or if I can be of any other help  Please see me in 2 weeks for a recheck visit

## 2018-05-05 ENCOUNTER — Encounter: Payer: Self-pay | Admitting: Family Medicine

## 2018-05-05 ENCOUNTER — Telehealth: Payer: Self-pay

## 2018-05-05 NOTE — Telephone Encounter (Signed)
Pt. Calling to get status update on re-certification of FMLA. [employer] apparently faxed form on 11/25, and pt. Needs it to be completed and faxed ASAP. Routed to Dr. Patsy Lageropland and Windell Mouldinguth, covering CMA tmr., to review and update pt.

## 2018-05-05 NOTE — Telephone Encounter (Signed)
Ok- this paperwork was completed on 11/25 and I believe it was faxed the same day It is not in her media tab yet as it is probably waiting to be scanned at the main scanning facility somewhere.  Perhaps Jasmine DecemberSharon has a copy?   Will send a message to pt as well

## 2018-05-06 ENCOUNTER — Telehealth: Payer: Self-pay | Admitting: *Deleted

## 2018-05-06 NOTE — Telephone Encounter (Signed)
    Received request for "Information to complete a thorough assessment of pt's disability claim. Information is necessary to determine if pt is eligible to receive disability payments under his/her plan".  Medical Records include the following: -Disabling Diagnosis -Updated clinical exam findings to support functional impairment preventing patient from performing job -Copies of last two OC notes with exam findings and operative reports, if applicable -Current and Recommended Treatment plan -Current Restrictions and Limitations and how assessed -Current Medications, doses and side effects, if applicable  -DiagnosticTest results -Return to work plan -Any referrals or consultations scheduled and name of that physician     Gave this paperwork with OV notes and Imaging result note; I will write a letter for any further information that provider believes need be forwarded to [employer]/SLS 12/04

## 2018-05-07 NOTE — Telephone Encounter (Signed)
Received paperwork back with Letter written to sender; will fax all paperwork, including OV notes and Imaging test results & provider letter to sender today/SLS 12/05 

## 2018-05-07 NOTE — Telephone Encounter (Signed)
Received paperwork back with Letter written to sender; will fax all paperwork, including OV notes and Imaging test results & provider letter to sender today/SLS 12/05
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
## 2018-05-11 ENCOUNTER — Ambulatory Visit (INDEPENDENT_AMBULATORY_CARE_PROVIDER_SITE_OTHER): Payer: 59 | Admitting: Family Medicine

## 2018-05-11 ENCOUNTER — Encounter: Payer: Self-pay | Admitting: Family Medicine

## 2018-05-11 VITALS — BP 124/78 | HR 89 | Temp 97.9°F | Resp 16 | Ht 66.0 in | Wt 298.0 lb

## 2018-05-11 DIAGNOSIS — M545 Low back pain, unspecified: Secondary | ICD-10-CM

## 2018-05-11 DIAGNOSIS — G44319 Acute post-traumatic headache, not intractable: Secondary | ICD-10-CM | POA: Diagnosis not present

## 2018-05-11 DIAGNOSIS — F439 Reaction to severe stress, unspecified: Secondary | ICD-10-CM

## 2018-05-11 DIAGNOSIS — S060X0A Concussion without loss of consciousness, initial encounter: Secondary | ICD-10-CM | POA: Diagnosis not present

## 2018-05-11 MED ORDER — CYCLOBENZAPRINE HCL 10 MG PO TABS: 10.0000 mg | ORAL_TABLET | Freq: Two times a day (BID) | ORAL | 0 refills | Status: DC | PRN
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
## 2018-06-01 ENCOUNTER — Encounter: Payer: Self-pay | Admitting: Family Medicine

## 2018-06-18 ENCOUNTER — Encounter: Payer: Self-pay | Admitting: Family Medicine

## 2018-06-18 ENCOUNTER — Other Ambulatory Visit: Payer: Self-pay | Admitting: Family Medicine

## 2018-06-18 DIAGNOSIS — M545 Low back pain, unspecified: Secondary | ICD-10-CM

## 2018-06-18 MED ORDER — CYCLOBENZAPRINE HCL 10 MG PO TABS: 10.0000 mg | ORAL_TABLET | Freq: Two times a day (BID) | ORAL | 0 refills | Status: DC | PRN

## 2018-07-20 LAB — LIPID PANEL
Cholesterol: 158 (ref 0–200)
HDL: 43 (ref 35–70)
LDL Cholesterol: 79
Triglycerides: 183 — AB (ref 40–160)

## 2018-07-24 ENCOUNTER — Encounter: Payer: Self-pay | Admitting: Family Medicine

## 2019-04-01 ENCOUNTER — Emergency Department (HOSPITAL_BASED_OUTPATIENT_CLINIC_OR_DEPARTMENT_OTHER)
Admission: EM | Admit: 2019-04-01 | Discharge: 2019-04-01 | Disposition: A | Discharge status: Home / Self Care | Payer: 59 | Attending: Emergency Medicine | Admitting: Emergency Medicine

## 2019-04-01 ENCOUNTER — Ambulatory Visit: Payer: Self-pay | Admitting: *Deleted

## 2019-04-01 ENCOUNTER — Other Ambulatory Visit: Payer: Self-pay

## 2019-04-01 ENCOUNTER — Emergency Department (HOSPITAL_BASED_OUTPATIENT_CLINIC_OR_DEPARTMENT_OTHER): Payer: 59

## 2019-04-01 ENCOUNTER — Encounter (HOSPITAL_BASED_OUTPATIENT_CLINIC_OR_DEPARTMENT_OTHER): Payer: Self-pay | Admitting: Emergency Medicine

## 2019-04-01 DIAGNOSIS — Z20828 Contact with and (suspected) exposure to other viral communicable diseases: Secondary | ICD-10-CM | POA: Diagnosis not present

## 2019-04-01 DIAGNOSIS — R0789 Other chest pain: Secondary | ICD-10-CM | POA: Diagnosis not present

## 2019-04-01 DIAGNOSIS — Z79899 Other long term (current) drug therapy: Secondary | ICD-10-CM | POA: Insufficient documentation

## 2019-04-01 DIAGNOSIS — E785 Hyperlipidemia, unspecified: Secondary | ICD-10-CM | POA: Insufficient documentation

## 2019-04-01 DIAGNOSIS — Z87891 Personal history of nicotine dependence: Secondary | ICD-10-CM | POA: Diagnosis not present

## 2019-04-01 LAB — BASIC METABOLIC PANEL
Anion gap: 6 (ref 5–15)
BUN: 16 mg/dL (ref 6–20)
CO2: 22 mmol/L (ref 22–32)
Calcium: 9 mg/dL (ref 8.9–10.3)
Chloride: 108 mmol/L (ref 98–111)
Creatinine, Ser: 0.66 mg/dL (ref 0.44–1.00)
GFR calc Af Amer: >60 mL/min (ref >60)
GFR calc non Af Amer: >60 mL/min (ref >60)
Glucose, Bld: 101 mg/dL — ABNORMAL HIGH (ref 70–99)
Potassium: 4.3 mmol/L (ref 3.5–5.1)
Sodium: 136 mmol/L (ref 135–145)

## 2019-04-01 LAB — TROPONIN I (HIGH SENSITIVITY): Troponin I (High Sensitivity): <2 ng/L (ref <18)

## 2019-04-01 LAB — CBC
HCT: 40.5 % (ref 36.0–46.0)
Hemoglobin: 12.8 g/dL (ref 12.0–15.0)
MCH: 26.1 pg (ref 26.0–34.0)
MCHC: 31.6 g/dL (ref 30.0–36.0)
MCV: 82.7 fL (ref 80.0–100.0)
Platelets: 302 10*3/uL (ref 150–400)
RBC: 4.9 MIL/uL (ref 3.87–5.11)
RDW: 14.6 % (ref 11.5–15.5)
WBC: 5.7 10*3/uL (ref 4.0–10.5)
nRBC: 0 % (ref 0.0–0.2)

## 2019-04-01 LAB — PREGNANCY, URINE: Preg Test, Ur: NEGATIVE

## 2019-04-01 LAB — D-DIMER, QUANTITATIVE: D-Dimer, Quant: 0.27 ug/mL-FEU (ref 0.00–0.50)

## 2019-04-01 MED ORDER — LIDOCAINE 5 % EX PTCH: 1.0000 | MEDICATED_PATCH | CUTANEOUS | 0 refills | Status: AC

## 2019-04-01 MED FILL — LIDOCAINE PATCH 5%: 5 | 15 days supply | Qty: 15 | Fill #0

## 2019-04-01 NOTE — Telephone Encounter (Signed)
Pt called in c/o chest pain that started a week ago on the left side when she twisted her body but now it has a dull ache in the middle of her chest all the time.   She got very short of breath going up 3 flights of stairs to where she lives and had to sit down at the top "to gather herself".  She really did not want to go to the ED and asked if there was anyway Dr. Lorelei Pont would see her in the office.  I called into Dr. Lillie Fragmin office and spoke with [facility]   They checked and Dr. Lorelei Pont wanted her to go on to the ED.   I let the pt know this.  She was agreeable to going.   She is going to the ED at [facility]    I sent these notes to Dr. Lillie Fragmin office.   Reason for Disposition . [1] Chest pain lasts > 5 minutes AND [2] occurred in past 3 days (72 hours)    Pain going on a week.   Constant ache.  Answer Assessment - Initial Assessment Questions 1. LOCATION: "Where does it hurt?"       First started on the left side intense.   When I turn my body it hurts.   After a couple of days of that it's now in the middle of my chest and dull ache all the time.    2. RADIATION: "Does the pain go anywhere else?" (e.g., into neck, jaw, arms, back)     No radiation 3. ONSET: "When did the chest pain begin?" (Minutes, hours or days)      Been about a week. 4. PATTERN "Does the pain come and go, or has it been constant since it started?"  "Does it get worse with exertion?"      Constant 5. DURATION: "How long does it last" (e.g., seconds, minutes, hours)     Constant but not intense 6. SEVERITY: "How bad is the pain?"  (e.g., Scale 1-10; mild, moderate, or severe)    - MILD (1-3): doesn't interfere with normal activities     - MODERATE (4-7): interferes with normal activities or awakens from sleep    - SEVERE (8-10): excruciating pain, unable to do any normal activities       Mild 7. CARDIAC RISK FACTORS: "Do you have any history of heart problems or risk factors for heart  disease?" (e.g., angina, prior heart attack; diabetes, high blood pressure, high cholesterol, smoker, or strong family history of heart disease)     Mother hashypertension 40. PULMONARY RISK FACTORS: "Do you have any history of lung disease?"  (e.g., blood clots in lung, asthma, emphysema, birth control pills)     None of the above. 9. CAUSE: "What do you think is causing the chest pain?"     I have no idea.    I thought it was reflux so I took med for that and also an aspirin and neither med helped. 10. OTHER SYMPTOMS: "Do you have any other symptoms?" (e.g., dizziness, nausea, vomiting, sweating, fever, difficulty breathing, cough)       My feet are feeling numb also.  They feel tingling this morning only the left foot.   I have shortness of breath coming up the steps which is not normal to me.     I live on the 3rd floor and when I came in I had to sit down at the top of the steps  to gather myself.   11. PREGNANCY: "Is there any chance you are pregnant?" "When was your last menstrual period?"       Slim chance  Protocols used: CHEST PAIN-A-AH

## 2019-04-01 NOTE — ED Triage Notes (Signed)
Pt having centralized chest pain x one week.  Feels like a pressure in her chest. Pt also having numbness and tingling in her left leg and foot.  Pt states the pain was sharp initially, and began on left side but then moved to center.  Pt states she is having some difficulty breathing going up steps.  Denies hormone therapy. Pt admits to travel to [place] in car x 3 weeks ago.  No diaphoresis or N/V.  No known fever.

## 2019-04-01 NOTE — ED Provider Notes (Signed)
[facility] EMERGENCY DEPARTMENT Provider Note   CSN: [id number] Arrival date & time: 04/01/19  0932     History   Chief Complaint Chief Complaint  Patient presents with  . Chest Pain    HPI Melanie Stout is a 33 y.o. female.     The history is provided by the patient.  Chest Pain Pain location:  L chest Pain quality: aching   Pain radiates to:  Does not radiate Pain severity:  Mild Onset quality:  Gradual Duration:  1 week Timing:  Constant Progression:  Unchanged Chronicity:  New Context: movement   Relieved by: OTC meds. Worsened by:  Movement Associated symptoms: no abdominal pain, no back pain, no cough, no fever, no palpitations, no shortness of breath and no vomiting   Risk factors: no birth control, no coronary artery disease, no diabetes mellitus, no high cholesterol, no hypertension and no prior DVT/PE     Past Medical History:  Diagnosis Date  . Abnormal Pap smear   . Blood transfusion without reported diagnosis 01/05/2013  . Headache(784.0)   . Hx of varicella   . Hyperlipidemia     Patient Active Problem List   Diagnosis Date Noted  . Obesity, unspecified obesity severity, unspecified obesity type 08/01/2016  . Tobacco use disorder 06/05/2016    Past Surgical History:  Procedure Laterality Date  . NOSE SURGERY       OB History    Gravida  2   Para  2   Term  1   Preterm  0   AB  0   Living  1     SAB  0   TAB  0   Ectopic  0   Multiple      Live Births  1            Home Medications    Prior to Admission medications   Medication Sig Start Date End Date Taking? Authorizing Provider  cetirizine (ZYRTEC) 10 MG tablet Take by mouth. 11/20/17   [provider]  cyclobenzaprine (FLEXERIL) 10 MG tablet Take 1 tablet (10 mg total) by mouth 2 (two) times daily as needed for muscle spasms. 06/18/18   Copland, Gay Filler, MD  dexlansoprazole (DEXILANT) 60 MG capsule Take 1 capsule (60 mg  total) by mouth daily. 04/16/18   Jackquline Denmark, MD  FLUoxetine (PROZAC) 20 MG tablet Take 1 tablet (20 mg total) by mouth daily. Increase to 2 pills (40 mg) after 2 weeks 02/11/18   Copland, Gay Filler, MD  ibuprofen (ADVIL,MOTRIN) 800 MG tablet Take 1 tablet (800 mg total) by mouth 3 (three) times daily. 04/23/18   Raylene Everts, MD  lidocaine (LIDODERM) 5 % Place 1 patch onto the skin daily for 15 days. Remove & Discard patch within 12 hours or as directed by MD 04/01/19 04/16/19  Lennice Sites, DO  tiZANidine (ZANAFLEX) 4 MG tablet Take 1 tablet (4 mg total) by mouth every 6 (six) hours as needed for muscle spasms. 04/23/18   Raylene Everts, MD    Family History Family History  Problem Relation Age of Onset  . Hypertension Mother   . Parkinson's disease Father   . Diabetes Maternal Aunt     Social History Social History   Tobacco Use  . Smoking status: Former Smoker    Packs/day: 0.50    Quit date: 07/24/2016    Years since quitting: 2.6  . Smokeless tobacco: Never Used  Substance Use Topics  . Alcohol  use: Yes    Comment: occ  . Drug use: No     Allergies   Patient has no known allergies.   Review of Systems Review of Systems  Constitutional: Negative for chills and fever.  HENT: Negative for ear pain and sore throat.   Eyes: Negative for pain and visual disturbance.  Respiratory: Negative for cough and shortness of breath.   Cardiovascular: Positive for chest pain. Negative for palpitations.  Gastrointestinal: Negative for abdominal pain and vomiting.  Genitourinary: Negative for dysuria and hematuria.  Musculoskeletal: Negative for arthralgias and back pain.  Skin: Negative for color change and rash.  Neurological: Negative for seizures and syncope.  All other systems reviewed and are negative.    Physical Exam Updated Vital Signs  ED Triage Vitals  Enc Vitals Group     BP 04/01/19 0949 126/84     Pulse Rate 04/01/19 0949 67     Resp 04/01/19  0949 14     Temp 04/01/19 0949 98.4 F (36.9 C)     Temp src --      SpO2 04/01/19 0949 98 %     Weight 04/01/19 0950 295 lb (133.8 kg)     Height 04/01/19 0950 5\' 6"  (1.676 m)     Head Circumference --      Peak Flow --      Pain Score 04/01/19 0950 6     Pain Loc --      Pain Edu? --      Excl. in GC? --     Physical Exam Vitals signs and nursing note reviewed.  Constitutional:      General: She is not in acute distress.    Appearance: She is well-developed. She is not ill-appearing.  HENT:     Head: Normocephalic and atraumatic.  Eyes:     Extraocular Movements: Extraocular movements intact.     Conjunctiva/sclera: Conjunctivae normal.     Pupils: Pupils are equal, round, and reactive to light.  Neck:     Musculoskeletal: Normal range of motion and neck supple.  Cardiovascular:     Rate and Rhythm: Normal rate and regular rhythm.     Pulses:          Radial pulses are 2+ on the right side and 2+ on the left side.     Heart sounds: Normal heart sounds. No murmur.  Pulmonary:     Effort: Pulmonary effort is normal. No respiratory distress.     Breath sounds: Normal breath sounds. No decreased breath sounds, wheezing, rhonchi or rales.  Abdominal:     Palpations: Abdomen is soft.     Tenderness: There is no abdominal tenderness.  Musculoskeletal:     Right lower leg: No edema.     Left lower leg: No edema.  Skin:    General: Skin is warm and dry.     Capillary Refill: Capillary refill takes less than 2 seconds.  Neurological:     General: No focal deficit present.     Mental Status: She is alert.      ED Treatments / Results  Labs (all labs ordered are listed, but only abnormal results are displayed) Labs Reviewed  BASIC METABOLIC PANEL - Abnormal; Notable for the following components:      Result Value   Glucose, Bld 101 (*)    All other components within normal limits  NOVEL CORONAVIRUS, NAA (HOSP ORDER, SEND-OUT TO REF LAB; TAT 18-24 HRS)  CBC   PREGNANCY, URINE  D-DIMER, QUANTITATIVE (NOT AT [facility])  TROPONIN I (HIGH SENSITIVITY)    EKG EKG Interpretation  Date/Time:  Thursday April 01 2019 09:46:27 EDT Ventricular Rate:  69 PR Interval:    QRS Duration: 89 QT Interval:  375 QTC Calculation: 402 R Axis:   40 Text Interpretation: Sinus rhythm Borderline short PR interval Confirmed by Virgina Norfolk [phone]) on 04/01/2019 10:01:56 AM   Radiology Dg Chest 2 View  Result Date: 04/01/2019 CLINICAL DATA:  Chest pain. Additional history provided: Patient having chest tightness for 1 week. EXAM: CHEST - 2 VIEW COMPARISON:  Esophagram 11/26/2017, chest radiograph 08/14/2017 FINDINGS: Heart size within normal limits. There is no focal consolidation within the lungs.No evidence of pleural effusion or pneumothorax.No acute bony abnormality. IMPRESSION: No active cardiopulmonary disease. Electronically Signed   By: Jackey Loge DO   On: 04/01/2019 10:23    Procedures Procedures (including critical care time)  Medications Ordered in ED Medications - No data to display   Initial Impression / Assessment and Plan / ED Course  I have reviewed the triage vital signs and the nursing notes.  Pertinent labs & imaging results that were available during my care of the patient were reviewed by me and considered in my medical decision making (see chart for details).     Nishka Breyonna Nault is a 32 year old female who presents to the ED with chest pain.  No significant medical history.  Normal vitals.  No fever.  Pain persistent for about a week.  Worse with movement.  Likely in the setting of overuse.  Troponin normal.  EKG shows sinus rhythm.  No ischemic changes.  Does not have any cardiac risk factors.  Doubt ACS.  Pain appears more muscular in nature.  D-dimer is negative and doubt PE.  Chest x-ray showed no signs of pneumonia, no pneumothorax, no pleural effusion.  Lab work showed no significant anemia, electrolyte abnormality,  kidney injury.  Overall patient given reassurance.  Given prescription for lidocaine patch.  Recommend follow-up with primary care doctor discharged in the ED in good condition.  Will swab for coronavirus per patient request.  This chart was dictated using voice recognition software.  Despite best efforts to proofread,  errors can occur which can change the documentation meaning.   [place] Rayonna Heldman was evaluated in Emergency Department on 04/01/2019 for the symptoms described in the history of present illness. She was evaluated in the context of the global COVID-19 pandemic, which necessitated consideration that the patient might be at risk for infection with the SARS-CoV-2 virus that causes COVID-19. Institutional protocols and algorithms that pertain to the evaluation of patients at risk for COVID-19 are in a state of rapid change based on information released by regulatory bodies including the CDC and federal and state organizations. These policies and algorithms were followed during the patient's care in the ED.   Final Clinical Impressions(s) / ED Diagnoses   Final diagnoses:  Atypical chest pain    ED Discharge Orders         Ordered    lidocaine (LIDODERM) 5 %  Every 24 hours     04/01/19 1224           Virgina Norfolk, DO 04/01/19 1224

## 2019-04-01 NOTE — Telephone Encounter (Signed)
FYI patient going to ED 

## 2019-04-01 NOTE — Telephone Encounter (Signed)
Advised per Dr. Lorelei Pont go to er for cardiac workup and r/o PE.

## 2019-04-03 LAB — NOVEL CORONAVIRUS, NAA (HOSP ORDER, SEND-OUT TO REF LAB; TAT 18-24 HRS): SARS-CoV-2, NAA: NOT DETECTED

## 2019-04-16 ENCOUNTER — Other Ambulatory Visit: Payer: Self-pay

## 2019-04-16 ENCOUNTER — Ambulatory Visit (INDEPENDENT_AMBULATORY_CARE_PROVIDER_SITE_OTHER): Payer: 59 | Admitting: Medical

## 2019-04-16 ENCOUNTER — Encounter: Payer: Self-pay | Admitting: Medical

## 2019-04-16 VITALS — BP 125/82 | HR 79 | Temp 95.4°F | Resp 16 | Ht 66.0 in | Wt 288.0 lb

## 2019-04-16 DIAGNOSIS — G5603 Carpal tunnel syndrome, bilateral upper limbs: Secondary | ICD-10-CM | POA: Diagnosis not present

## 2019-04-16 DIAGNOSIS — M545 Low back pain, unspecified: Secondary | ICD-10-CM

## 2019-04-16 DIAGNOSIS — G629 Polyneuropathy, unspecified: Secondary | ICD-10-CM

## 2019-04-16 DIAGNOSIS — R739 Hyperglycemia, unspecified: Secondary | ICD-10-CM | POA: Diagnosis not present

## 2019-04-16 DIAGNOSIS — N912 Amenorrhea, unspecified: Secondary | ICD-10-CM

## 2019-04-16 LAB — POCT URINE PREGNANCY: Preg Test, Ur: NEGATIVE

## 2019-04-16 LAB — COMPREHENSIVE METABOLIC PANEL
ALT: 14 U/L (ref 0–35)
AST: 13 U/L (ref 0–37)
Albumin: 4.3 g/dL (ref 3.5–5.2)
Alkaline Phosphatase: 79 U/L (ref 39–117)
BUN: 11 mg/dL (ref 6–23)
CO2: 23 mEq/L (ref 19–32)
Calcium: 8.9 mg/dL (ref 8.4–10.5)
Chloride: 105 mEq/L (ref 96–112)
Creatinine, Ser: 0.71 mg/dL (ref 0.40–1.20)
GFR: 113.53 mL/min (ref >60.00)
Glucose, Bld: 91 mg/dL (ref 70–99)
Potassium: 4.4 mEq/L (ref 3.5–5.1)
Sodium: 139 mEq/L (ref 135–145)
Total Bilirubin: 0.3 mg/dL (ref 0.2–1.2)
Total Protein: 6.8 g/dL (ref 6.0–8.3)

## 2019-04-16 LAB — EXTRA SPECIMEN

## 2019-04-16 LAB — HEMOGLOBIN A1C: Hgb A1c MFr Bld: 5.3 % (ref 4.6–6.5)

## 2019-04-16 LAB — VITAMIN B12: Vitamin B-12: 339 pg/mL (ref 211–911)

## 2019-04-16 MED ORDER — DICLOFENAC SODIUM 75 MG PO TBEC: 75.0000 mg | DELAYED_RELEASE_TABLET | Freq: Two times a day (BID) | ORAL | 0 refills | Status: DC

## 2019-04-16 NOTE — Patient Instructions (Addendum)
Your hand tingling/numbness does appear to represent carpel tunnel syndrome type symptoms. I want you to use diclofenac nsaid and left upper ext wrist cock up splint. Otc.  For left foot tingling will get lumbar spine xray. To see if finding might indicate radicular type pain.  Since some upper and lower ext symptoms decided to get cmp, b12 and b1 for neuropathy.  If you get any motor deficit type symptoms then recommend ED evaulation.  For ammenorhea/irregular menses hx we did preg test.  Your recent atypical chest pain resolved. If recurrent intermittent let us know and refer to cardiology. If returns and constant then recommend ED evaluation.  Follow up 10 days pcp or as needed

## 2019-04-16 NOTE — Progress Notes (Signed)
   Subjective:    Patient ID: Melanie Stout, female    DOB: 1986/03/13, 33 y.o.   MRN: [id number]  HPI  Pt in for some recent tingling to both hands and at times has tingling to left foot.  She states for about week and half notes tingling to hands at night. States when attempting to fall asleep seems to be worse. Very minimal during the day. Pt denies any neck pain. No radiating type pain from neck.  Pt does report left foot tingling about same time as tingling to hands. Pt does have history of lower back pain for about 1-2 years. Intermittent recurrent self limited pain that might come for 3-4 days then resolve.  LMP- over 2 months. She states hx of irregular cycles.  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  April 01, 2019 had atypical chest pain. Work up was negative. 2 days later symptoms resolved.     Review of Systems  Constitutional: Negative for chills, fatigue and fever.  Respiratory: Negative for chest tightness, shortness of breath and wheezing.   Cardiovascular: Negative for chest pain and palpitations.  Gastrointestinal: Negative for abdominal pain.  Neurological: Negative for dizziness, numbness and headaches.  Hematological: Negative for adenopathy. Does not bruise/bleed easily.  Psychiatric/Behavioral: Negative for behavioral problems and decreased concentration.       Objective:   Physical Exam  General Mental Status- Alert. General Appearance- Not in acute distress.   Skin General: Color- Normal Color. Moisture- Normal Moisture.  Neck Carotid Arteries- Normal color. Moisture- Normal Moisture. No carotid bruits. No JVD. No mid cspine tender. Negative spurling test.  Chest and Lung Exam Auscultation: Breath Sounds:-Normal.  Cardiovascular Auscultation:Rythm- Regular. Murmurs & Other Heart Sounds:Auscultation of the heart reveals- No Murmurs.  Abdomen Inspection:-Inspeection Normal.  Palpation/Percussion:Note:No mass. Palpation and Percussion of the abdomen reveal- Non Tender, Non Distended + BS, no rebound or guarding.    Neurologic Cranial Nerve exam:- CN III-XII intact(No nystagmus), symmetric smile. Drift Test:- No drift. Romberg Exam:- Negative.  Heal to Toe Gait exam:-Normal. Finger to Nose:- Normal/Intact Strength:- 5/5 equal and symmetric strength both upper and lower extremities. Sharp and dull discrimination intact both hands. Left foot sharp and dull discrimination intact as well.   Back No Mid lumbar spine tenderness to palpation. Pain on straight leg lift. Left side mild/daint Pain on lateral movements and flexion/extension of the spine.  Lower ext neurologic  L5-S1 sensation intact bilaterally. Normal patellar reflexes bilaterally. No foot drop bilaterally.  Upper ext- + phalens signs bilaterally. Worse left side.     Assessment & Plan:  Your hand tingling/numbness does appear to represent carpel tunnel syndrome type symptoms. I want you to use diclofenac nsaid and left upper ext wrist cock up splint. Otc.  For left foot tingling will get lumbar spine xray. To see if finding might indicate radicular type pain.  Since some upper and lower ext symptoms decided to get cmp, b12 and b1 for neuropathy.  If you get any motor deficit type symptoms then recommend ED evaulation.  For ammenorhea/irregular menses hx we did preg test.  Your recent atypical chest pain resolved. If recurrent intermittent let us know and refer to cardiology. If returns and constant then recommend ED evaluation.  Follow up 10 days pcp or as needed  40 minutes spent with pt. 50% of time spent counseling pt on plan going f[place]rard  [employer], PA-C

## 2019-04-20 LAB — VITAMIN B1: Vitamin B1 (Thiamine): <6 nmol/L — ABNORMAL LOW (ref 8–30)

## 2019-07-13 ENCOUNTER — Other Ambulatory Visit: Payer: Self-pay | Admitting: Gastroenterology

## 2019-08-05 ENCOUNTER — Ambulatory Visit: Payer: 59 | Admitting: Family Medicine
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
## 2019-10-01 ENCOUNTER — Telehealth: Payer: Self-pay

## 2019-10-01 ENCOUNTER — Other Ambulatory Visit: Payer: Self-pay

## 2019-10-01 DIAGNOSIS — F4323 Adjustment disorder with mixed anxiety and depressed mood: Secondary | ICD-10-CM

## 2019-10-01 NOTE — Telephone Encounter (Signed)
Last OV was 04/16/19 and last refill was 02/11/2018. Please advise

## 2019-10-01 NOTE — Telephone Encounter (Signed)
Patient called in to see if Dr. Patsy Lager could send in a prescription for  FLUoxetine (PROZAC) 20 MG tablet [[id number]]    Please send it to [facility] [address], [place], [place] [address]   Phone number: [phone]

## 2019-10-02 ENCOUNTER — Encounter: Payer: Self-pay | Admitting: Family Medicine

## 2019-10-02 MED ORDER — FLUOXETINE HCL 20 MG PO TABS: 20.0000 mg | ORAL_TABLET | Freq: Every day | ORAL | 0 refills | Status: DC

## 2019-10-04 ENCOUNTER — Ambulatory Visit: Payer: 59 | Admitting: Family Medicine
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
## 2019-10-06 ENCOUNTER — Encounter: Payer: Self-pay | Admitting: Family Medicine

## 2019-10-06 ENCOUNTER — Other Ambulatory Visit: Payer: Self-pay

## 2019-10-06 ENCOUNTER — Ambulatory Visit (INDEPENDENT_AMBULATORY_CARE_PROVIDER_SITE_OTHER): Payer: 59 | Admitting: Family Medicine

## 2019-10-06 VITALS — BP 130/80 | HR 86 | Temp 97.8°F | Resp 18 | Ht 66.0 in | Wt 274.2 lb

## 2019-10-06 DIAGNOSIS — R739 Hyperglycemia, unspecified: Secondary | ICD-10-CM

## 2019-10-06 DIAGNOSIS — M545 Low back pain, unspecified: Secondary | ICD-10-CM

## 2019-10-06 DIAGNOSIS — Z13220 Encounter for screening for lipoid disorders: Secondary | ICD-10-CM

## 2019-10-06 DIAGNOSIS — Z130 Encounter for screening for diseases of the blood and blood-forming organs and certain disorders involving the immune mechanism: Secondary | ICD-10-CM | POA: Diagnosis not present

## 2019-10-06 DIAGNOSIS — F4323 Adjustment disorder with mixed anxiety and depressed mood: Secondary | ICD-10-CM

## 2019-10-06 MED ORDER — FLUOXETINE HCL 20 MG PO TABS: 20.0000 mg | ORAL_TABLET | Freq: Every day | ORAL | 4 refills | Status: DC

## 2019-10-06 MED ORDER — CYCLOBENZAPRINE HCL 10 MG PO TABS: 10.0000 mg | ORAL_TABLET | Freq: Two times a day (BID) | ORAL | 0 refills | Status: DC | PRN

## 2019-10-06 MED ORDER — DEXILANT 60 MG PO CPDR: 60.0000 mg | DELAYED_RELEASE_CAPSULE | Freq: Every day | ORAL | 3 refills | Status: DC

## 2019-10-06 NOTE — Patient Instructions (Signed)
It was good to see you today and meet you son- however I am so sorry for the recent loss of your father We will get routine labs today and make sure your thyroid, blood sugar, blood counts, etc are ok Try flexeril as needed for the pain in your upper back/ muscle spasm Remember this medication can make you drowsy!  We will start you back on fluoxetine for depression and anxiety- start with 20 mg daily and increase to 40 mg after 1-2 weeks if you like  Please let me know if you are not feeling better in a couple of weeks- sooner if getting worse

## 2019-10-07 ENCOUNTER — Encounter: Payer: Self-pay | Admitting: Family Medicine

## 2019-10-07 LAB — COMPREHENSIVE METABOLIC PANEL
ALT: 12 U/L (ref 0–35)
AST: 14 U/L (ref 0–37)
Albumin: 4.1 g/dL (ref 3.5–5.2)
Alkaline Phosphatase: 71 U/L (ref 39–117)
BUN: 14 mg/dL (ref 6–23)
CO2: 24 mEq/L (ref 19–32)
Calcium: 8.8 mg/dL (ref 8.4–10.5)
Chloride: 107 mEq/L (ref 96–112)
Creatinine, Ser: 0.75 mg/dL (ref 0.40–1.20)
GFR: 106.28 mL/min (ref >60.00)
Glucose, Bld: 110 mg/dL — ABNORMAL HIGH (ref 70–99)
Potassium: 3.8 mEq/L (ref 3.5–5.1)
Sodium: 138 mEq/L (ref 135–145)
Total Bilirubin: 0.4 mg/dL (ref 0.2–1.2)
Total Protein: 6.6 g/dL (ref 6.0–8.3)

## 2019-10-07 LAB — LIPID PANEL
Cholesterol: 160 mg/dL (ref 0–200)
HDL: 34.7 mg/dL — ABNORMAL LOW (ref >39.00)
LDL Cholesterol: 97 mg/dL (ref 0–99)
NonHDL: 125.04
Total CHOL/HDL Ratio: 5
Triglycerides: 138 mg/dL (ref 0.0–149.0)
VLDL: 27.6 mg/dL (ref 0.0–40.0)

## 2019-10-07 LAB — CBC
HCT: 39.1 % (ref 36.0–46.0)
Hemoglobin: 12.9 g/dL (ref 12.0–15.0)
MCHC: 33.1 g/dL (ref 30.0–36.0)
MCV: 83 fl (ref 78.0–100.0)
Platelets: 343 10*3/uL (ref 150.0–400.0)
RBC: 4.71 Mil/uL (ref 3.87–5.11)
RDW: 14 % (ref 11.5–15.5)
WBC: 8.1 10*3/uL (ref 4.0–10.5)

## 2019-10-07 LAB — TSH: TSH: 1.28 u[IU]/mL (ref 0.35–4.50)

## 2019-10-07 LAB — HEMOGLOBIN A1C: Hgb A1c MFr Bld: 5.1 % (ref 4.6–6.5)

## 2019-10-13 NOTE — Progress Notes (Signed)
[facility] [address], [address] [place], [place] [address] [phone]  Date:  10/14/2019   Name:  Melanie Stout   DOB:  09-09-1985   MRN:  [id number]  PCP:  Pearline Cables, MD    Chief Complaint: Recurrent Skin Infections (left under arm, several months, painful)   History of Present Illness:  Melanie Stout is a 34 y.o. very pleasant female patient who presents with the following:  Patient with history of obesity, here today with concern of a boil under her right arm Last seen by myself just about a week ago with concern of anxiety and depression.  Her father had recently died which was very difficult. We started her back on fluoxetine and did routine lab work Patient notes that she is processing her grief okay, she is still struggling but does feel the fluoxetine is helping  Today she is concerned of multiple bumps under both arms, these have been present for the last couple of years.  One on the left side recently drained some purulent material.  She does not typically get these same bumps in the groin or under the breast area.  Otherwise she is feeling well- No fever or chills She is not able to shave her underarms or the condition tends to worsen.  Patient Active Problem List   Diagnosis Date Noted  . Obesity, unspecified obesity severity, unspecified obesity type 08/01/2016  . Tobacco use disorder 06/05/2016    Past Medical History:  Diagnosis Date  . Abnormal Pap smear   . Blood transfusion without reported diagnosis 01/05/2013  . Headache(784.0)   . Hx of varicella   . Hyperlipidemia     Past Surgical History:  Procedure Laterality Date  . NOSE SURGERY      Social History   Tobacco Use  . Smoking status: Former Smoker    Packs/day: 0.50    Quit date: 07/24/2016    Years since quitting: 3.2  . Smokeless tobacco: Never Used  Substance Use Topics  . Alcohol use: Yes   Comment: occ  . Drug use: No    Family History  Problem Relation Age of Onset  . Hypertension Mother   . Parkinson's disease Father   . Diabetes Maternal Aunt     No Known Allergies  Medication list has been reviewed and updated.  Current Outpatient Medications on File Prior to Visit  Medication Sig Dispense Refill  . cetirizine (ZYRTEC) 10 MG tablet Take by mouth.    . cyclobenzaprine (FLEXERIL) 10 MG tablet Take 1 tablet (10 mg total) by mouth 2 (two) times daily as needed for muscle spasms. 30 tablet 0  . dexlansoprazole (DEXILANT) 60 MG capsule Take 1 capsule (60 mg total) by mouth daily. 90 capsule 3  . diclofenac (VOLTAREN) 75 MG EC tablet Take 1 tablet (75 mg total) by mouth 2 (two) times daily. 20 tablet 0  . FLUoxetine (PROZAC) 20 MG tablet Take 1 tablet (20 mg total) by mouth daily. Increase to 2 pills (40 mg) after 2 weeks 60 tablet 4  . ibuprofen (ADVIL,MOTRIN) 800 MG tablet Take 1 tablet (800 mg total) by mouth 3 (three) times daily. 21 tablet 0   No current facility-administered medications on file prior to visit.    Review of Systems:  As per HPI- otherwise negative.   Physical Examination: Vitals:   10/14/19 1521  BP: 132/82  Pulse: 86  Resp: 18  Temp: 97.8  F (36.6 C)  SpO2: 98%   Vitals:   10/14/19 1521  Weight: 275 lb (124.7 kg)  Height: 5\' 6"  (1.676 m)   Body mass index is 44.39 kg/m. Ideal Body Weight: Weight in (lb) to have BMI = 25: 154.6  GEN: no acute distress.  Obese, otherwise looks well HEENT: Atraumatic, Normocephalic.  Ears and Nose: No external deformity. CV: RRR, No M/G/R. No JVD. No thrill. No extra heart sounds. PULM: CTA B, no wheezes, crackles, rhonchi. No retractions. No resp. distress. No accessory muscle use. EXTR: No c/c/e PSYCH: Normally interactive. Conversant.  Patient has scarring and multiple small fluctuant areas in both axilla consistent with hidradenitis suppurativa.  There is one site of drainage on the left,  none of them appear acutely ready for I&D   Assessment and Plan: Hidradenitis suppurativa - Plan: doxycycline (VIBRAMYCIN) 100 MG capsule, clindamycin (CLEOCIN-T) 1 % external solution  Patient is here today with hidradenitis suppurativa.  We discussed this condition and I gave her a handout with more information.  We will try a course of oral doxycycline, also prescribed topical clindamycin for to use twice a day as needed.  She will let me know if these conservative measures are not helpful, in that case we can plan for consultation with dermatology or possibly surgery  This visit occurred during the SARS-CoV-2 public health emergency.  Safety protocols were in place, including screening questions prior to the visit, additional usage of staff PPE, and extensive cleaning of exam room while observing appropriate contact time as indicated for disinfecting solutions.    Signed Lamar Blinks, MD

## 2019-10-14 ENCOUNTER — Encounter: Payer: Self-pay | Admitting: Family Medicine

## 2019-10-14 ENCOUNTER — Ambulatory Visit: Payer: 59 | Admitting: Family Medicine

## 2019-10-14 ENCOUNTER — Other Ambulatory Visit: Payer: Self-pay

## 2019-10-14 VITALS — BP 132/82 | HR 86 | Temp 97.8°F | Resp 18 | Ht 66.0 in | Wt 275.0 lb

## 2019-10-14 DIAGNOSIS — L732 Hidradenitis suppurativa: Secondary | ICD-10-CM

## 2019-10-14 MED ORDER — CLINDAMYCIN PHOSPHATE 1 % EX SOLN: Freq: Two times a day (BID) | CUTANEOUS | 1 refills | Status: DC

## 2019-10-14 MED ORDER — DOXYCYCLINE HYCLATE 100 MG PO CAPS: 100.0000 mg | ORAL_CAPSULE | Freq: Two times a day (BID) | ORAL | 0 refills | Status: DC

## 2019-10-14 NOTE — Patient Instructions (Signed)
It was good to see you again today As we discussed, I think that you have hidradenitis suppurativa. This is a frustrating skin condition that tends to occur in the underarms and groin.   Try to keep the area as dry and cool as you can Use the clindamycin solution that I gave you- we will see if this may help  We will also try 10 days of doxycycline antibiotic by mouth

## 2020-02-23 ENCOUNTER — Ambulatory Visit: Payer: 59 | Admitting: Medical

## 2020-03-21 LAB — OB RESULTS CONSOLE ABO/RH: RH Type: POSITIVE

## 2020-03-21 LAB — OB RESULTS CONSOLE HIV ANTIBODY (ROUTINE TESTING): HIV: NONREACTIVE

## 2020-03-21 LAB — OB RESULTS CONSOLE RPR: RPR: NONREACTIVE

## 2020-03-21 LAB — OB RESULTS CONSOLE RUBELLA ANTIBODY, IGM: Rubella: IMMUNE

## 2020-03-21 LAB — OB RESULTS CONSOLE ANTIBODY SCREEN: Antibody Screen: NEGATIVE

## 2020-03-21 LAB — HEPATITIS C ANTIBODY: HCV Ab: NEGATIVE

## 2020-05-05 NOTE — Progress Notes (Deleted)
[facility] at [facility] [address], [address] [place], [place] [address] [phone]  Date:  05/08/2020   Name:  Melanie Stout   DOB:  09-08-1985   MRN:  [id number]  PCP:  Pearline Cables, MD    Chief Complaint: No chief complaint on file.   History of Present Illness:  Melanie Stout is a 34 y.o. very pleasant female patient who presents with the following:  Patient here today with concern of hand numbness I last saw her in May of this year for follow-up of hidradenitis  Patient Active Problem List   Diagnosis Date Noted  . Obesity, unspecified obesity severity, unspecified obesity type 08/01/2016  . Tobacco use disorder 06/05/2016    Past Medical History:  Diagnosis Date  . Abnormal Pap smear   . Blood transfusion without reported diagnosis 01/05/2013  . Headache(784.0)   . Hx of varicella   . Hyperlipidemia     Past Surgical History:  Procedure Laterality Date  . NOSE SURGERY      Social History   Tobacco Use  . Smoking status: Former Smoker    Packs/day: 0.50    Quit date: 07/24/2016    Years since quitting: 3.7  . Smokeless tobacco: Never Used  Vaping Use  . Vaping Use: Never used  Substance Use Topics  . Alcohol use: Yes    Comment: occ  . Drug use: No    Family History  Problem Relation Age of Onset  . Hypertension Mother   . Parkinson's disease Father   . Diabetes Maternal Aunt     No Known Allergies  Medication list has been reviewed and updated.  Current Outpatient Medications on File Prior to Visit  Medication Sig Dispense Refill  . cetirizine (ZYRTEC) 10 MG tablet Take by mouth.    . clindamycin (CLEOCIN-T) 1 % external solution Apply topically 2 (two) times daily. Use as needed for underarm infection 60 mL 1  . cyclobenzaprine (FLEXERIL) 10 MG tablet Take 1 tablet (10 mg total) by mouth 2 (two) times daily as needed for muscle spasms. 30 tablet 0  .  dexlansoprazole (DEXILANT) 60 MG capsule Take 1 capsule (60 mg total) by mouth daily. 90 capsule 3  . diclofenac (VOLTAREN) 75 MG EC tablet Take 1 tablet (75 mg total) by mouth 2 (two) times daily. 20 tablet 0  . doxycycline (VIBRAMYCIN) 100 MG capsule Take 1 capsule (100 mg total) by mouth 2 (two) times daily. 20 capsule 0  . FLUoxetine (PROZAC) 20 MG tablet Take 1 tablet (20 mg total) by mouth daily. Increase to 2 pills (40 mg) after 2 weeks 60 tablet 4  . ibuprofen (ADVIL,MOTRIN) 800 MG tablet Take 1 tablet (800 mg total) by mouth 3 (three) times daily. 21 tablet 0   No current facility-administered medications on file prior to visit.    Review of Systems:  As per HPI- otherwise negative.   Physical Examination: There were no vitals filed for this visit. There were no vitals filed for this visit. There is no height or weight on file to calculate BMI. Ideal Body Weight:    GEN: no acute distress. HEENT: Atraumatic, Normocephalic.  Ears and Nose: No external deformity. CV: RRR, No M/G/R. No JVD. No thrill. No extra heart sounds. PULM: CTA B, no wheezes, crackles, rhonchi. No retractions. No resp. distress. No accessory muscle use. ABD: S, NT, ND, +BS. No rebound. No HSM. EXTR: No c/c/e PSYCH: Normally  interactive. Conversant.    Assessment and Plan: *** This visit occurred during the SARS-CoV-2 public health emergency.  Safety protocols were in place, including screening questions prior to the visit, additional usage of staff PPE, and extensive cleaning of exam room while observing appropriate contact time as indicated for disinfecting solutions.    Signed Lamar Blinks, MD

## 2020-05-08 ENCOUNTER — Ambulatory Visit: Payer: 59 | Admitting: Family Medicine

## 2020-06-03 NOTE — L&D Delivery Note (Signed)
Delivery Note At 12:23 PM a viable female was delivered via Vaginal, Spontaneous (Presentation: Right Occiput Anterior).  APGAR: pending, ; weight  pending Placenta status:  normal  .  Cord: 3 vessels with the following complications: None.  Cord pH: not sent  Anesthesia: Epidural Episiotomy: None Lacerations: None Suture Repair: none Est. Blood Loss (mL): 400   Given history of PPH, Cytotec placed prophylactically.  It's a boy!!   Mom to postpartum.  Baby to Couplet care / Skin to Skin.  Ranae Pila 11/04/2020, 12:35 PM

## 2020-10-05 LAB — OB RESULTS CONSOLE GBS: GBS: NEGATIVE

## 2020-11-01 ENCOUNTER — Encounter (HOSPITAL_COMMUNITY): Payer: Self-pay

## 2020-11-01 ENCOUNTER — Other Ambulatory Visit (HOSPITAL_COMMUNITY): Payer: 59 | Attending: Obstetrics and Gynecology

## 2020-11-01 ENCOUNTER — Encounter (HOSPITAL_COMMUNITY): Payer: Self-pay | Admitting: *Deleted

## 2020-11-01 ENCOUNTER — Telehealth (HOSPITAL_COMMUNITY): Payer: Self-pay | Admitting: *Deleted

## 2020-11-01 NOTE — Telephone Encounter (Signed)
Preadmission screen  

## 2020-11-02 ENCOUNTER — Other Ambulatory Visit (HOSPITAL_COMMUNITY)
Admission: RE | Admit: 2020-11-02 | Discharge: 2020-11-02 | Disposition: A | Discharge status: Home / Self Care | Payer: 59 | Source: Ambulatory Visit | Attending: Obstetrics and Gynecology | Admitting: Obstetrics and Gynecology

## 2020-11-02 ENCOUNTER — Telehealth (HOSPITAL_COMMUNITY): Payer: Self-pay | Admitting: *Deleted

## 2020-11-02 ENCOUNTER — Other Ambulatory Visit: Payer: Self-pay

## 2020-11-02 DIAGNOSIS — Z20822 Contact with and (suspected) exposure to covid-19: Secondary | ICD-10-CM | POA: Insufficient documentation

## 2020-11-02 DIAGNOSIS — Z01812 Encounter for preprocedural laboratory examination: Secondary | ICD-10-CM | POA: Insufficient documentation

## 2020-11-02 LAB — SARS CORONAVIRUS 2 (TAT 6-24 HRS): SARS Coronavirus 2: NEGATIVE

## 2020-11-02 NOTE — Telephone Encounter (Signed)
Preadmission screen Message left confirming covid test location of [address] [employer]

## 2020-11-03 ENCOUNTER — Inpatient Hospital Stay (HOSPITAL_COMMUNITY)
Admission: AD | Admit: 2020-11-03 | Discharge: 2020-11-05 | DRG: 806 | Disposition: A | Discharge status: Home / Self Care | Payer: 59 | Attending: Obstetrics and Gynecology | Admitting: Obstetrics and Gynecology

## 2020-11-03 ENCOUNTER — Other Ambulatory Visit: Payer: Self-pay

## 2020-11-03 ENCOUNTER — Inpatient Hospital Stay (HOSPITAL_COMMUNITY): Payer: 59

## 2020-11-03 ENCOUNTER — Encounter (HOSPITAL_COMMUNITY): Payer: Self-pay | Admitting: Obstetrics and Gynecology

## 2020-11-03 DIAGNOSIS — Z87891 Personal history of nicotine dependence: Secondary | ICD-10-CM | POA: Diagnosis not present

## 2020-11-03 DIAGNOSIS — Z3A40 40 weeks gestation of pregnancy: Secondary | ICD-10-CM

## 2020-11-03 DIAGNOSIS — Z20822 Contact with and (suspected) exposure to covid-19: Secondary | ICD-10-CM | POA: Diagnosis present

## 2020-11-03 DIAGNOSIS — O480 Post-term pregnancy: Secondary | ICD-10-CM | POA: Diagnosis present

## 2020-11-03 LAB — CBC
HCT: 30.6 % — ABNORMAL LOW (ref 36.0–46.0)
Hemoglobin: 10.1 g/dL — ABNORMAL LOW (ref 12.0–15.0)
MCH: 27 pg (ref 26.0–34.0)
MCHC: 33 g/dL (ref 30.0–36.0)
MCV: 81.8 fL (ref 80.0–100.0)
Platelets: 352 10*3/uL (ref 150–400)
RBC: 3.74 MIL/uL — ABNORMAL LOW (ref 3.87–5.11)
RDW: 14 % (ref 11.5–15.5)
WBC: 7.3 10*3/uL (ref 4.0–10.5)
nRBC: 0 % (ref 0.0–0.2)

## 2020-11-03 LAB — RPR: RPR Ser Ql: NONREACTIVE

## 2020-11-03 LAB — TYPE AND SCREEN
ABO/RH(D): O POS
Antibody Screen: NEGATIVE

## 2020-11-03 MED ORDER — EPHEDRINE 5 MG/ML INJ: 10.0000 mg | INTRAVENOUS | Status: DC | PRN

## 2020-11-03 MED ORDER — OXYTOCIN-SODIUM CHLORIDE 30-0.9 UT/500ML-% IV SOLN
1.0000 m[IU]/min | INTRAVENOUS | Status: DC
  Administered 2020-11-04: 2 m[IU]/min via INTRAVENOUS
  Administered 2020-11-04: 6 m[IU]/min via INTRAVENOUS
  Filled 2020-11-03: qty 500

## 2020-11-03 MED ORDER — FENTANYL-BUPIVACAINE-NACL 0.5-0.125-0.9 MG/250ML-% EP SOLN
12.0000 mL/h | EPIDURAL | Status: DC | PRN
  Administered 2020-11-04: 12 mL/h via EPIDURAL
  Filled 2020-11-03: qty 250

## 2020-11-03 MED ORDER — MISOPROSTOL 50MCG HALF TABLET
50.0000 ug | ORAL_TABLET | Freq: Once | ORAL | Status: AC
  Administered 2020-11-03: 50 ug via BUCCAL

## 2020-11-03 MED ORDER — OXYTOCIN BOLUS FROM INFUSION
333.0000 mL | Freq: Once | INTRAVENOUS | Status: AC
  Administered 2020-11-04: 333 mL via INTRAVENOUS

## 2020-11-03 MED ORDER — OXYCODONE-ACETAMINOPHEN 5-325 MG PO TABS: 2.0000 | ORAL_TABLET | ORAL | Status: DC | PRN

## 2020-11-03 MED ORDER — BUTORPHANOL TARTRATE 1 MG/ML IJ SOLN
1.0000 mg | INTRAMUSCULAR | Status: DC | PRN
  Administered 2020-11-03 (×2): 1 mg via INTRAVENOUS
  Filled 2020-11-03: qty 1

## 2020-11-03 MED ORDER — LACTATED RINGERS IV SOLN: INTRAVENOUS | Status: DC

## 2020-11-03 MED ORDER — OXYTOCIN-SODIUM CHLORIDE 30-0.9 UT/500ML-% IV SOLN: 2.5000 [IU]/h | INTRAVENOUS | Status: DC

## 2020-11-03 MED ORDER — PHENYLEPHRINE 40 MCG/ML (10ML) SYRINGE FOR IV PUSH (FOR BLOOD PRESSURE SUPPORT): 80.0000 ug | PREFILLED_SYRINGE | INTRAVENOUS | Status: DC | PRN

## 2020-11-03 MED ORDER — BUTORPHANOL TARTRATE 1 MG/ML IJ SOLN
INTRAMUSCULAR | Status: AC
  Filled 2020-11-03: qty 1

## 2020-11-03 MED ORDER — TERBUTALINE SULFATE 1 MG/ML IJ SOLN
0.2500 mg | Freq: Once | INTRAMUSCULAR | Status: DC | PRN
  Filled 2020-11-03: qty 1

## 2020-11-03 MED ORDER — SOD CITRATE-CITRIC ACID 500-334 MG/5ML PO SOLN: 30.0000 mL | ORAL | Status: DC | PRN

## 2020-11-03 MED ORDER — DIPHENHYDRAMINE HCL 50 MG/ML IJ SOLN: 12.5000 mg | INTRAMUSCULAR | Status: DC | PRN

## 2020-11-03 MED ORDER — LACTATED RINGERS IV SOLN: 500.0000 mL | Freq: Once | INTRAVENOUS | Status: DC

## 2020-11-03 MED ORDER — ONDANSETRON HCL 4 MG/2ML IJ SOLN: 4.0000 mg | Freq: Four times a day (QID) | INTRAMUSCULAR | Status: DC | PRN

## 2020-11-03 MED ORDER — MISOPROSTOL 25 MCG QUARTER TABLET
25.0000 ug | ORAL_TABLET | ORAL | Status: DC | PRN
  Administered 2020-11-03 (×5): 25 ug via VAGINAL
  Filled 2020-11-03 (×5): qty 1

## 2020-11-03 MED ORDER — LIDOCAINE HCL (PF) 1 % IJ SOLN: 30.0000 mL | INTRAMUSCULAR | Status: DC | PRN

## 2020-11-03 MED ORDER — OXYCODONE-ACETAMINOPHEN 5-325 MG PO TABS: 1.0000 | ORAL_TABLET | ORAL | Status: DC | PRN

## 2020-11-03 MED ORDER — MISOPROSTOL 50MCG HALF TABLET
ORAL_TABLET | ORAL | Status: AC
  Filled 2020-11-03: qty 1

## 2020-11-03 MED ORDER — ACETAMINOPHEN 325 MG PO TABS: 650.0000 mg | ORAL_TABLET | ORAL | Status: DC | PRN

## 2020-11-03 MED ORDER — LACTATED RINGERS IV SOLN: 500.0000 mL | INTRAVENOUS | Status: DC | PRN

## 2020-11-03 NOTE — H&P (Signed)
Melanie Stout is a 35 y.o. G 3 P 1001 at 40 w 3 days presents for post dates IOL. She is status post 2 cytotec Only feeling cramping. OB History    Gravida  3   Para  2   Term  1   Preterm  0   AB  0   Living  1     SAB  0   IAB  0   Ectopic  0   Multiple      Live Births  1          Past Medical History:  Diagnosis Date  . Abnormal Pap smear   . Blood transfusion without reported diagnosis 01/05/2013  . Headache(784.0)   . Hx of varicella   . Hyperlipidemia    Past Surgical History:  Procedure Laterality Date  . NOSE SURGERY     Family History: family history includes Diabetes in her maternal aunt; Hypertension in her mother; Parkinson's disease in her father. Social History:  reports that she quit smoking about 4 years ago. She smoked 0.50 packs per day. She has never used smokeless tobacco. She reports current alcohol use. She reports that she does not use drugs.     Maternal Diabetes: No Genetic Screening: Normal Maternal Ultrasounds/Referrals: Normal Fetal Ultrasounds or other Referrals:  None Maternal Substance Abuse:  No Significant Maternal Medications:  None Significant Maternal Lab Results:  None Other Comments:  None  Review of Systems  All other systems reviewed and are negative.  Maternal Medical History:  Fetal activity: Perceived fetal activity is normal.      Dilation: 1 Effacement (%): Thick Station: -3 Exam by:: Dequon Schnebly Blood pressure 135/77, pulse 63, temperature 97.8 F (36.6 C), temperature source Oral, resp. rate 18, height 5\' 6"  (1.676 m), weight 126.4 kg, unknown if currently breastfeeding. Exam Physical Exam  Prenatal labs: ABO, Rh: --/--/O POS (06/03 0040) Antibody: NEG (06/03 0040) Rubella: Immune (10/19 0000) RPR: Nonreactive (10/19 0000)  HBsAg:    HIV: Non-reactive (10/19 0000)  GBS:     Assessment/Plan: IUP at 40 w 3 days IOL post dates Cervix is still unfavorable - repeat cytotec 2 more  times and then reevaluate    04-18-1991 11/03/2020, 7:27 AM

## 2020-11-04 ENCOUNTER — Encounter (HOSPITAL_COMMUNITY): Payer: Self-pay | Admitting: Obstetrics and Gynecology

## 2020-11-04 ENCOUNTER — Inpatient Hospital Stay (HOSPITAL_COMMUNITY): Payer: 59 | Admitting: Anesthesiology

## 2020-11-04 LAB — CBC
HCT: 35.1 % — ABNORMAL LOW (ref 36.0–46.0)
Hemoglobin: 11.2 g/dL — ABNORMAL LOW (ref 12.0–15.0)
MCH: 26.4 pg (ref 26.0–34.0)
MCHC: 31.9 g/dL (ref 30.0–36.0)
MCV: 82.8 fL (ref 80.0–100.0)
Platelets: 349 10*3/uL (ref 150–400)
RBC: 4.24 MIL/uL (ref 3.87–5.11)
RDW: 14 % (ref 11.5–15.5)
WBC: 10.6 10*3/uL — ABNORMAL HIGH (ref 4.0–10.5)
nRBC: 0 % (ref 0.0–0.2)

## 2020-11-04 LAB — RUBELLA SCREEN: Rubella: 2.37 index (ref >0.99)

## 2020-11-04 MED ORDER — DIBUCAINE (PERIANAL) 1 % EX OINT: 1.0000 "application " | TOPICAL_OINTMENT | CUTANEOUS | Status: DC | PRN

## 2020-11-04 MED ORDER — MISOPROSTOL 200 MCG PO TABS
ORAL_TABLET | ORAL | Status: AC
  Filled 2020-11-04: qty 5

## 2020-11-04 MED ORDER — PRENATAL MULTIVITAMIN CH
1.0000 | ORAL_TABLET | Freq: Every day | ORAL | Status: DC
  Administered 2020-11-05: 1 via ORAL
  Filled 2020-11-04: qty 1

## 2020-11-04 MED ORDER — LIDOCAINE HCL (PF) 1 % IJ SOLN
INTRAMUSCULAR | Status: DC | PRN
  Administered 2020-11-04: 11 mL via EPIDURAL

## 2020-11-04 MED ORDER — METHYLERGONOVINE MALEATE 0.2 MG/ML IJ SOLN: 0.2000 mg | Freq: Once | INTRAMUSCULAR | Status: DC

## 2020-11-04 MED ORDER — SENNOSIDES-DOCUSATE SODIUM 8.6-50 MG PO TABS
2.0000 | ORAL_TABLET | ORAL | Status: DC
  Administered 2020-11-04: 2 via ORAL
  Filled 2020-11-04: qty 2

## 2020-11-04 MED ORDER — IBUPROFEN 600 MG PO TABS
600.0000 mg | ORAL_TABLET | Freq: Four times a day (QID) | ORAL | Status: DC
  Administered 2020-11-04 – 2020-11-05 (×4): 600 mg via ORAL
  Filled 2020-11-04 (×4): qty 1

## 2020-11-04 MED ORDER — TRANEXAMIC ACID-NACL 1000-0.7 MG/100ML-% IV SOLN: 1000.0000 mg | INTRAVENOUS | Status: AC

## 2020-11-04 MED ORDER — OXYCODONE HCL 5 MG PO TABS: 5.0000 mg | ORAL_TABLET | ORAL | Status: DC | PRN

## 2020-11-04 MED ORDER — TRANEXAMIC ACID-NACL 1000-0.7 MG/100ML-% IV SOLN
INTRAVENOUS | Status: AC
  Administered 2020-11-04: 1000 mg
  Filled 2020-11-04: qty 100

## 2020-11-04 MED ORDER — MISOPROSTOL 200 MCG PO TABS
1000.0000 ug | ORAL_TABLET | Freq: Once | ORAL | Status: AC
  Administered 2020-11-04: 1000 ug via RECTAL

## 2020-11-04 MED ORDER — OXYCODONE HCL 5 MG PO TABS: 10.0000 mg | ORAL_TABLET | ORAL | Status: DC | PRN

## 2020-11-04 MED ORDER — METHYLERGONOVINE MALEATE 0.2 MG/ML IJ SOLN
INTRAMUSCULAR | Status: AC
  Administered 2020-11-04: 0.2 mg
  Filled 2020-11-04: qty 1

## 2020-11-04 MED ORDER — TETANUS-DIPHTH-ACELL PERTUSSIS 5-2.5-18.5 LF-MCG/0.5 IM SUSY: 0.5000 mL | PREFILLED_SYRINGE | Freq: Once | INTRAMUSCULAR | Status: DC

## 2020-11-04 MED ORDER — ZOLPIDEM TARTRATE 5 MG PO TABS: 5.0000 mg | ORAL_TABLET | Freq: Every evening | ORAL | Status: DC | PRN

## 2020-11-04 MED ORDER — METHYLERGONOVINE MALEATE 0.2 MG/ML IJ SOLN: 0.2000 mg | INTRAMUSCULAR | Status: DC | PRN

## 2020-11-04 MED ORDER — CARBOPROST TROMETHAMINE 250 MCG/ML IM SOLN: 250.0000 ug | Freq: Once | INTRAMUSCULAR | Status: DC

## 2020-11-04 MED ORDER — METHYLERGONOVINE MALEATE 0.2 MG PO TABS: 0.2000 mg | ORAL_TABLET | ORAL | Status: DC | PRN

## 2020-11-04 MED ORDER — DIPHENHYDRAMINE HCL 25 MG PO CAPS: 25.0000 mg | ORAL_CAPSULE | Freq: Four times a day (QID) | ORAL | Status: DC | PRN

## 2020-11-04 MED ORDER — COCONUT OIL OIL: 1.0000 "application " | TOPICAL_OIL | Status: DC | PRN

## 2020-11-04 MED ORDER — WITCH HAZEL-GLYCERIN EX PADS: 1.0000 "application " | MEDICATED_PAD | CUTANEOUS | Status: DC | PRN

## 2020-11-04 MED ORDER — SIMETHICONE 80 MG PO CHEW: 80.0000 mg | CHEWABLE_TABLET | ORAL | Status: DC | PRN

## 2020-11-04 MED ORDER — BENZOCAINE-MENTHOL 20-0.5 % EX AERO: 1.0000 "application " | INHALATION_SPRAY | CUTANEOUS | Status: DC | PRN

## 2020-11-04 MED ORDER — ONDANSETRON HCL 4 MG PO TABS
4.0000 mg | ORAL_TABLET | ORAL | Status: DC | PRN
Start: 2020-11-04 — End: 2020-11-05

## 2020-11-04 MED ORDER — ONDANSETRON HCL 4 MG/2ML IJ SOLN: 4.0000 mg | INTRAMUSCULAR | Status: DC | PRN

## 2020-11-04 MED ORDER — METHYLERGONOVINE MALEATE 0.2 MG/ML IJ SOLN
INTRAMUSCULAR | Status: AC
  Filled 2020-11-04: qty 1

## 2020-11-04 MED ORDER — ACETAMINOPHEN 325 MG PO TABS
650.0000 mg | ORAL_TABLET | ORAL | Status: DC | PRN
Start: 2020-11-04 — End: 2020-11-05

## 2020-11-04 MED ORDER — LORATADINE 10 MG PO TABS
10.0000 mg | ORAL_TABLET | Freq: Every day | ORAL | Status: DC
  Administered 2020-11-05: 10 mg via ORAL
  Filled 2020-11-04: qty 1

## 2020-11-04 NOTE — Progress Notes (Signed)
After pads and chucks weighed, total ebl approx 1500cc. Will go ahead and give methergine and hemabate and send stat cbc.

## 2020-11-04 NOTE — Progress Notes (Signed)
Remains Cat 2 tracing with recurrent variables but variability still reassuing.  CTM closely.

## 2020-11-04 NOTE — Progress Notes (Signed)
SVE 8/100/-1 IFE placed Deep variables with every contraction but variability still good toco inadequate but due to decels cannot increase pitocin OP and manual rotation unsuccessful ctm closely and change positions

## 2020-11-04 NOTE — Progress Notes (Signed)
Correction - EBL 1100cc total.  CBC 11 likely s/s hemodilution.  Bleeding greatly improved.

## 2020-11-04 NOTE — Anesthesia Preprocedure Evaluation (Signed)
Anesthesia Evaluation  Patient identified by MRN, date of birth, ID band Patient awake    Reviewed: Allergy & Precautions, H&P , NPO status , Patient's Chart, lab work & pertinent test results, reviewed documented beta blocker date and time   History of Anesthesia Complications Negative for: history of anesthetic complications  Airway Mallampati: III  TM Distance: >3 FB Neck ROM: full    Dental  (+) Teeth Intact   Pulmonary former smoker,    breath sounds clear to auscultation       Cardiovascular hypertension (PIH),  Rhythm:regular Rate:Normal     Neuro/Psych  Headaches, negative psych ROS   GI/Hepatic negative GI ROS, Neg liver ROS,   Endo/Other  Morbid obesity  Renal/GU negative Renal ROS     Musculoskeletal   Abdominal (+) + obese,   Peds  Hematology negative hematology ROS (+)   Anesthesia Other Findings   Reproductive/Obstetrics (+) Pregnancy                             Anesthesia Physical  Anesthesia Plan  ASA: III  Anesthesia Plan: Epidural   Post-op Pain Management:    Induction:   PONV Risk Score and Plan:   Airway Management Planned:   Additional Equipment:   Intra-op Plan:   Post-operative Plan:   Informed Consent: I have reviewed the patients History and Physical, chart, labs and discussed the procedure including the risks, benefits and alternatives for the proposed anesthesia with the patient or authorized representative who has indicated his/her understanding and acceptance.       Plan Discussed with:   Anesthesia Plan Comments:         Anesthesia Quick Evaluation

## 2020-11-04 NOTE — Anesthesia Postprocedure Evaluation (Signed)
Anesthesia Post Note  Patient: Melanie Stout  Procedure(s) Performed: AN AD HOC LABOR EPIDURAL     Patient location during evaluation: Mother Baby Anesthesia Type: Epidural Level of consciousness: awake and alert Pain management: pain level controlled Vital Signs Assessment: post-procedure vital signs reviewed and stable Respiratory status: spontaneous breathing, nonlabored ventilation and respiratory function stable Cardiovascular status: stable Postop Assessment: no headache, no backache and epidural receding Anesthetic complications: no   No complications documented.  Last Vitals:  Vitals:   11/04/20 1416 11/04/20 1431  BP: 136/72 (!) 148/77  Pulse: 66 63  Resp: 18 18  Temp:    SpO2:      Last Pain:  Vitals:   11/04/20 1100  TempSrc:   PainSc: 0-No pain   Pain Goal: Patients Stated Pain Goal: 3 (11/03/20 1853)                 Mauricia Area

## 2020-11-04 NOTE — Progress Notes (Signed)
Patient comfortable with epidural  Bleeding looks like bloody show  FHR Category 1  Cervix is 80% loose 2 cm -2 Vertex  AROM   Clear fluid  Continue Pitocin

## 2020-11-04 NOTE — Anesthesia Procedure Notes (Signed)
Epidural Patient location during procedure: OB Start time: 11/04/2020 5:04 AM End time: 11/04/2020 5:21 AM  Staffing Anesthesiologist: Lowella Curb, MD Performed: anesthesiologist   Preanesthetic Checklist Completed: patient identified, IV checked, site marked, risks and benefits discussed, surgical consent, monitors and equipment checked, pre-op evaluation and timeout performed  Epidural Patient position: sitting Prep: ChloraPrep Patient monitoring: heart rate, cardiac monitor, continuous pulse ox and blood pressure Approach: midline Injection technique: LOR air  Needle:  Needle type: Tuohy  Needle gauge: 17 G Needle length: 9 cm Needle insertion depth: 8 cm Catheter type: closed end flexible Catheter size: 20 Guage Catheter at skin depth: 13 cm Test dose: negative  Assessment Events: blood not aspirated, injection not painful, no injection resistance, no paresthesia and negative IV test  Additional Notes Reason for block:procedure for pain

## 2020-11-04 NOTE — Progress Notes (Signed)
Doing well. Comf w/CLE. SVE 4/80/-1 IUPC placed s/s inability to trace contractions and titrate pitocin. Continue to increase pitocin - currently at 6.   Rosie Fate MD

## 2020-11-04 NOTE — Progress Notes (Signed)
Patient is status post 6 cytotec vaginally and 1 buccal cytotec.  She is now on 4 mu Pitocin  She just started noting contractions  About one hour ago, nurse noted moderate bright red bleeding   FHR is category 1  Cervix is now 80% / tight 2 cm / -3 vertex BOW intact  No clots or heavy bleeding with exam - looks like bloody show  Recommend Epidural when anesthesia is available   Will AROM after Epidural  Monitor vaginal bleeding

## 2020-11-04 NOTE — Progress Notes (Signed)
Called to bedside for additional bleeding.  Sterile and gowned bimanual exam done and mild uterine atony noted with old clot inside.  Approximately ~200-300cc additional clots expressed.  Bladder drained in a sterile fashion for 150cc.  TXA given.  Uterus firmed up and bleeding improved.  Given total EBL still <1000, will await CBC for tomorrow am.    Rosie Fate MD

## 2020-11-05 LAB — CBC
HCT: 30.8 % — ABNORMAL LOW (ref 36.0–46.0)
Hemoglobin: 9.9 g/dL — ABNORMAL LOW (ref 12.0–15.0)
MCH: 26.6 pg (ref 26.0–34.0)
MCHC: 32.1 g/dL (ref 30.0–36.0)
MCV: 82.8 fL (ref 80.0–100.0)
Platelets: 321 10*3/uL (ref 150–400)
RBC: 3.72 MIL/uL — ABNORMAL LOW (ref 3.87–5.11)
RDW: 13.9 % (ref 11.5–15.5)
WBC: 13.9 10*3/uL — ABNORMAL HIGH (ref 4.0–10.5)
nRBC: 0 % (ref 0.0–0.2)

## 2020-11-05 NOTE — Progress Notes (Addendum)
CSW met with MOB to complete consult for history of anixety. CSW observed MOB resting in bed while bonding with infant. CSW explained role and reason for consult. MOB was pleasant, polite, and engaged with CSW. MOB reported, history of anxiety from 8 yrs ago. MOB reported, she has been able to manage symptoms without any medication needed.   CSW provided education regarding the baby blues period vs. perinatal mood disorders, discussed treatment and gave resources for mental health follow up if concerns arise. CSW recommends self- evaluation during the postpartum time period using the New Mom Checklist from Postpartum Progress and encouraged MOB to contact a medical professional if symptoms are noted at any time.   When CSW asked MOB of her emotions since delivery. MOB reported, she feels, "wonderful" since infant's arrival. MOB reported, her mother is her support. MOB denied SI, HI, and DV when CSW assessed for safety.   MOB reported, there are no barriers to follow up infant's care. MOB reported, she has all essentials needed to care for infant. MOB reported, infant has a car seat and bassinet. MOB denied any additional barriers.     CSW provided education on sudden infant death syndrome (SIDS).    CSW identifies no further need for intervention or barriers to discharge at this time.   Haydee Jabbour, MSW, LCSW-A Clinical Social Worker [phone]   

## 2020-11-05 NOTE — Discharge Summary (Signed)
Postpartum Discharge Summary  Date of Service updated 11/05/20     Patient Name: Melanie Stout DOB: March 31, 1986 MRN: [id number]  Date of admission: 11/03/2020 Delivery date:11/04/2020  Delivering provider: Tyson Dense  Date of discharge: 11/05/2020  Admitting diagnosis: Post-dates pregnancy [O48.0] Intrauterine pregnancy: [redacted]w[redacted]d    Secondary diagnosis:  Active Problems:   Post-dates pregnancy  Additional problems: none    Discharge diagnosis: Term Pregnancy Delivered                                              Post partum procedures:none Augmentation: AROM, Pitocin and Cytotec Complications: [id number] Hospital course: Induction of Labor With Vaginal Delivery   35y.o. yo G3P2002 at 437w4das admitted to the hospital 11/03/2020 for induction of labor.  Indication for induction: Postdates.  Patient had an uncomplicated labor course as follows: Membrane Rupture Time/Date: 5:40 AM ,11/04/2020   Delivery Method:Vaginal, Spontaneous  Episiotomy: None  Lacerations:  None  Details of delivery can be found in separate delivery note. She had a PPH at delivery that responded to a full round of uterotonics.  Patient had a routine postpartum course. Patient is discharged home 11/05/20. Newborn Data: Birth date:11/04/2020  Birth time:12:23 PM  Gender:Female  Living status:Living  Apgars:8 ,9  WeKLKJZP:[id number]   Magnesium Sulfate received: No BMZ received: No Rhophylac:N/A MMR:N/A T-DaP:Given prenatally Flu: N/A Transfusion:No  Physical exam  Vitals:   11/04/20 1630 11/04/20 2030 11/05/20 0042 11/05/20 0442  BP: 132/60 117/69 125/69 129/73  Pulse: 67 68 70 66  Resp: _0 Temp: 98.4 F (36.9 C) 98.6 F (37 C) 97.7 F (36.5 C) 98 F (36.7 C)  TempSrc: Oral Oral Oral Oral  SpO2: 100% 100% 100% 100%  Weight:      Height:       General: alert Lochia: appropriate Uterine Fundus: firm Incision: N/A DVT Evaluation: No evidence of DVT seen on  physical exam. Labs: Lab Results  Component Value Date   WBC 13.9 (H) 11/05/2020   HGB 9.9 (L) 11/05/2020   HCT 30.8 (L) 11/05/2020   MCV 82.8 11/05/2020   PLT 321 11/05/2020   CMP Latest Ref Rng & Units 10/06/2019  Glucose 70 - 99 mg/dL 110(H)  BUN 6 - 23 mg/dL 14  Creatinine 0.40 - 1.20 mg/dL 0.75  Sodium 135 - 145 mEq/L 138  Potassium 3.5 - 5.1 mEq/L 3.8  Chloride 96 - 112 mEq/L 107  CO2 19 - 32 mEq/L 24  Calcium 8.4 - 10.5 mg/dL 8.8  Total Protein 6.0 - 8.3 g/dL 6.6  Total Bilirubin 0.2 - 1.2 mg/dL 0.4  Alkaline Phos 39 - 117 U/L 71  AST 0 - 37 U/L 14  ALT 0 - 35 U/L 12   Edinburgh Score: No flowsheet data found.    After visit meds:  Allergies as of 11/05/2020   No Known Allergies     Medication List    STOP taking these medications   cetirizine 10 MG tablet Commonly known as: ZYRTEC   clindamycin 1 % external solution Commonly known as: Cleocin-T   cyclobenzaprine 10 MG tablet Commonly known as: FLEXERIL   Dexilant 60 MG capsule Generic drug: dexlansoprazole   diclofenac 75 MG EC tablet Commonly known as: VOLTAREN   doxycycline 100 MG capsule Commonly known as: VIBRAMYCIN   FLUoxetine 20  MG tablet Commonly known as: PROZAC     TAKE these medications   ibuprofen 800 MG tablet Commonly known as: ADVIL Take 1 tablet (800 mg total) by mouth 3 (three) times daily.        Discharge home in stable condition Infant Feeding: Bottle and Breast Infant Disposition:home with mother Discharge instruction: per After Visit Summary and Postpartum booklet. Activity: Advance as tolerated. Pelvic rest for 6 weeks.  Diet: routine diet Anticipated Birth Control: Unsure Postpartum Appointment:6 weeks Additional Postpartum F/U: none Future Appointments:No future appointments. Follow up Visit:      11/05/2020 Tyson Dense, MD

## 2020-11-06 NOTE — H&P (Signed)
Melanie Stout is a 35 y.o. female presenting forpost dates IOL.2 OB History    Gravida  3   Para  3   Term  2   Preterm  0   AB  0   Living  2     SAB  0   IAB  0   Ectopic  0   Multiple  0   Live Births  2          Past Medical History:  Diagnosis Date  . Abnormal Pap smear   . Blood transfusion without reported diagnosis 01/05/2013  . Headache(784.0)   . Hx of varicella   . Hyperlipidemia    Past Surgical History:  Procedure Laterality Date  . NOSE SURGERY     Family History: family history includes Diabetes in her maternal aunt; Hypertension in her mother; Parkinson's disease in her father. Social History:  reports that she quit smoking about 4 years ago. She smoked 0.50 packs per day. She has never used smokeless tobacco. She reports current alcohol use. She reports that she does not use drugs.     Maternal Diabetes: No Genetic Screening: Normal Maternal Ultrasounds/Referrals: Normal Fetal Ultrasounds or other Referrals:  None Maternal Substance Abuse:  No Significant Maternal Medications:  None Significant Maternal Lab Results:  None Other Comments:  None  Review of Systems History Physical Exam Vitals and nursing note reviewed.  Constitutional:      Appearance: Normal appearance.  Eyes:     Pupils: Pupils are equal, round, and reactive to light.  Cardiovascular:     Rate and Rhythm: Regular rhythm. Tachycardia present.     Pulses: Normal pulses.  Genitourinary:    General: Normal vulva.     Prenatal labs: ABO, Rh: --/--/O POS (06/03 0040) Antibody: NEG (06/03 0040) Rubella: 2.37 (06/03 0040) RPR: NON REACTIVE (06/03 0040)  HBsAg:    HIV: Non-reactive (10/19 0000)  GBS: Negative/-- (05/05 0000)   Assessment/Plan: IUP at term Post dates IOL Cytotec then pitocin Jeani Hawking 11/06/2020, 9:54 AM

## 2020-11-08 ENCOUNTER — Inpatient Hospital Stay (HOSPITAL_COMMUNITY): Payer: 59

## 2020-11-08 ENCOUNTER — Inpatient Hospital Stay (HOSPITAL_COMMUNITY): Admission: AD | Admit: 2020-11-08 | Payer: 59 | Source: Home / Self Care | Admitting: Obstetrics and Gynecology

## 2021-02-12 ENCOUNTER — Telehealth: Payer: Self-pay

## 2021-02-12 NOTE — Telephone Encounter (Signed)
Patient was sent to triage fyi.

## 2021-02-12 NOTE — Telephone Encounter (Signed)
Pt called stating she tested positive for Covid yesterday and started with symptoms 2 days ago.  She stated she was having chest pain and it felt like "something was sitting on her chest."  No fever.  Pain in shoulders, aching, sore throat, cough is better now.

## 2021-02-13 ENCOUNTER — Other Ambulatory Visit: Payer: Self-pay

## 2021-02-13 ENCOUNTER — Telehealth: Payer: Self-pay | Admitting: *Deleted

## 2021-02-13 ENCOUNTER — Telehealth: Payer: Self-pay

## 2021-02-13 ENCOUNTER — Telehealth (INDEPENDENT_AMBULATORY_CARE_PROVIDER_SITE_OTHER): Payer: 59 | Admitting: Family

## 2021-02-13 ENCOUNTER — Telehealth: Payer: Self-pay | Admitting: Family

## 2021-02-13 ENCOUNTER — Telehealth: Payer: Self-pay | Admitting: Family Medicine

## 2021-02-13 DIAGNOSIS — U071 COVID-19: Secondary | ICD-10-CM | POA: Insufficient documentation

## 2021-02-13 MED ORDER — MOLNUPIRAVIR EUA 200MG CAPSULE: 4.0000 | ORAL_CAPSULE | Freq: Two times a day (BID) | ORAL | 0 refills | Status: AC

## 2021-02-13 NOTE — Progress Notes (Signed)
MyChart Video Visit    Virtual Visit via Video Note   This visit type was conducted due to national recommendations for restrictions regarding the COVID-19 Pandemic (e.g. social distancing) in an effort to limit this patient's exposure and mitigate transmission in our community. This patient is at least at moderate risk for complications without adequate follow up. This format is felt to be most appropriate for this patient at this time. Physical exam was limited by quality of the video and audio technology used for the visit. CMA was able to get the patient set up on a video visit.  Patient location: Home Patient and provider in visit Provider location: Office  I discussed the limitations of evaluation and management by telemedicine and the availability of in person appointments. The patient expressed understanding and agreed to proceed.  Visit Date: 02/13/2021  Today's healthcare provider: Lemont Fillers, NP     Subjective:    Patient ID: Melanie Stout, female    DOB: Jun 27, 1985, 35 y.o.   MRN: [id number]  Chief Complaint  Patient presents with   Covid Positive    Tested positive 02-11-21.    Covid symptoms    Symptoms started 02-10-21, now complaining of nasal congestion, cough, muscle ache on arms and hedache    HPI Patient is in today for a video visit.   Covid-19: She tested positive for Covid-19 on 02/11/2021. She became symptomatic 3 days ago on 02/10/2021. She is experiencing congestion, a cough, fatigue, and overall body aches. She complains of a headache that began with the other symptoms and was severe but is lingering as a dull constant headache. She is taking Tylenol currently to help with her symptoms.  Work: She is currently on leave and when her leave is finished, her quarantine will also be completed. She returns to work on 9/23.  Vaccinations: She has had two Pfizer Covid-19 vaccinations.  Medications: She is treating her symptoms with  Tylenol but is interested in starting an antiviral medication to help with her symptoms as well.  She states that she has been following with her GYN and suffered a miscarriage that was completed 2 weeks ago.    Past Medical History:  Diagnosis Date   Abnormal Pap smear    Blood transfusion without reported diagnosis 01/05/2013   Headache(784.0)    Hx of varicella    Hyperlipidemia     Past Surgical History:  Procedure Laterality Date   NOSE SURGERY      Family History  Problem Relation Age of Onset   Hypertension Mother    Parkinson's disease Father    Diabetes Maternal Aunt     Social History   Socioeconomic History   Marital status: Single    Spouse name: Not on file   Number of children: Not on file   Years of education: Not on file   Highest education level: Not on file  Occupational History   Not on file  Tobacco Use   Smoking status: Former    Packs/day: 0.50    Types: Cigarettes    Quit date: 07/24/2016    Years since quitting: 4.5   Smokeless tobacco: Never  Vaping Use   Vaping Use: Never used  Substance and Sexual Activity   Alcohol use: Yes    Comment: occ   Drug use: No   Sexual activity: Yes    Birth control/protection: None  Other Topics Concern   Not on file  Social History Narrative   ** Merged  History Encounter **       Social Determinants of Health   Financial Resource Strain: Not on file  Food Insecurity: Not on file  Transportation Needs: Not on file  Physical Activity: Not on file  Stress: Not on file  Social Connections: Not on file  Intimate Partner Violence: Not on file    Outpatient Medications Prior to Visit  Medication Sig Dispense Refill   acetaminophen (TYLENOL) 500 MG tablet Take 500 mg by mouth every 6 (six) hours as needed.     ibuprofen (ADVIL,MOTRIN) 800 MG tablet Take 1 tablet (800 mg total) by mouth 3 (three) times daily. 21 tablet 0   No facility-administered medications prior to visit.    No Known  Allergies  Review of Systems  Constitutional:  Positive for malaise/fatigue.  HENT:  Positive for congestion.   Respiratory:  Positive for cough.   Genitourinary:  Positive for dysuria.  Musculoskeletal:  Positive for joint pain and myalgias.  Neurological:  Positive for headaches (Started intense and now has become dull and constant).      Objective:    Physical Exam Constitutional:      General: She is not in acute distress.    Appearance: Normal appearance. She is not ill-appearing.  HENT:     Head: Normocephalic and atraumatic.  Neurological:     Mental Status: She is alert.  Psychiatric:        Behavior: Behavior normal.        Judgment: Judgment normal.    There were no vitals taken for this visit. Wt Readings from Last 3 Encounters:  11/03/20 278 lb 11.2 oz (126.4 kg)  10/14/19 275 lb (124.7 kg)  10/06/19 274 lb 3.2 oz (124.4 kg)    Diabetic Foot Exam - Simple   No data filed    Lab Results  Component Value Date   WBC 13.9 (H) 11/05/2020   HGB 9.9 (L) 11/05/2020   HCT 30.8 (L) 11/05/2020   PLT 321 11/05/2020   GLUCOSE 110 (H) 10/06/2019   CHOL 160 10/06/2019   TRIG 138.0 10/06/2019   HDL 34.70 (L) 10/06/2019   LDLCALC 97 10/06/2019   ALT 12 10/06/2019   AST 14 10/06/2019   NA 138 10/06/2019   K 3.8 10/06/2019   CL 107 10/06/2019   CREATININE 0.75 10/06/2019   BUN 14 10/06/2019   CO2 24 10/06/2019   TSH 1.28 10/06/2019   INR 1.16 01/05/2013   HGBA1C 5.1 10/06/2019    Lab Results  Component Value Date   TSH 1.28 10/06/2019   Lab Results  Component Value Date   WBC 13.9 (H) 11/05/2020   HGB 9.9 (L) 11/05/2020   HCT 30.8 (L) 11/05/2020   MCV 82.8 11/05/2020   PLT 321 11/05/2020   Lab Results  Component Value Date   NA 138 10/06/2019   K 3.8 10/06/2019   CO2 24 10/06/2019   GLUCOSE 110 (H) 10/06/2019   BUN 14 10/06/2019   CREATININE 0.75 10/06/2019   BILITOT 0.4 10/06/2019   ALKPHOS 71 10/06/2019   AST 14 10/06/2019   ALT 12  10/06/2019   PROT 6.6 10/06/2019   ALBUMIN 4.1 10/06/2019   CALCIUM 8.8 10/06/2019   ANIONGAP 6 04/01/2019   GFR 106.28 10/06/2019   Lab Results  Component Value Date   CHOL 160 10/06/2019   Lab Results  Component Value Date   HDL 34.70 (L) 10/06/2019   Lab Results  Component Value Date   LDLCALC 97 10/06/2019  Lab Results  Component Value Date   TRIG 138.0 10/06/2019   Lab Results  Component Value Date   CHOLHDL 5 10/06/2019   Lab Results  Component Value Date   HGBA1C 5.1 10/06/2019       Assessment & Plan:   Problem List Items Addressed This Visit       Unprioritized   COVID-19 virus infection - Primary    Patient is interested in antiviral rx.  We do not have up to date Cr for Paxlovid. Will rx with molnupiravir. She is advised to use condoms during treatment and for 4 days after completion of molnupiravir. Advised of CDC guidelines for self isolation/ ending isolation.  Advised of safe practice guidelines. Symptom Tier reviewed.  Encouraged to monitor for any worsening symptoms; watch for increased shortness of breath, weakness, and signs of dehydration. Advised when to seek emergency care.  Instructed to rest and hydrate well.  Advised to leave the house during recommended isolation period, only if it is necessary to seek medical care        No orders of the defined types were placed in this encounter.   I discussed the assessment and treatment plan with the patient. The patient was provided an opportunity to ask questions and all were answered. The patient agreed with the plan and demonstrated an understanding of the instructions.   The patient was advised to call back or seek an in-person evaluation if the symptoms worsen or if the condition fails to improve as anticipated.  I,Lyric Barr-McArthur,acting as a Neurosurgeon for [employer], NP.,have documented all relevant documentation on the behalf of Lemont Fillers, NP,as directed by  Lemont Fillers, NP while in the presence of Lemont Fillers, NP.   I provided 20 minutes of face-to-face time during this encounter.   Lemont Fillers, NP [employer] [facility] [employer] [phone] (phone) [phone] (fax)  [facility]

## 2021-02-13 NOTE — Telephone Encounter (Signed)
Who Is Calling Patient / Member / Family / Caregiver Call Type Triage / Clinical Relationship To Patient Self Return Phone Number [phone] (Primary) Chief Complaint CHEST PAIN - pain, pressure, heaviness or tightness Reason for Call Symptomatic / Request for Health Information Initial Comment Caller states she tested positive for covid yesterday. She has had symptoms for two days. She is calling back to report changes in symptoms. Her headache has returned. Her chest is hurting. Translation No Nurse Assessment Nurse: Melanee Spry, RN, Beth Date/Time (Eastern Time): 02/12/2021 11:45:30 AM Confirm and document reason for call. If symptomatic, describe symptoms. ---Caller states she tested positive for covid yesterday. She has had symptoms for two days. She is calling back to report changes in symptoms. Her headache has returned. Her chest is hurting: no cardiac hx. Is not constant, but not just when coughing. Pain Level: 5/10 Denies fever.

## 2021-02-13 NOTE — Telephone Encounter (Signed)
See mychart.  

## 2021-02-13 NOTE — Telephone Encounter (Signed)
Patient called stating her pharmacy has not received her molnupiravir prescription yet. Please advise.    Address: [address], [place], [place] [address] Phone: [phone]

## 2021-02-13 NOTE — Telephone Encounter (Signed)
Spoke with patient today and she stated that she did not go to ED because she felt better and chest pain intermittent.  Her runny nose has stopped but she still has a cough.  She also stated that her symptoms started about 3-4 days ago.  Virtual appointment made today at 1pm with Melissa.

## 2021-02-13 NOTE — Telephone Encounter (Signed)
Prescription sent, patient advised. She reports she is not breast feeding.

## 2021-02-13 NOTE — Telephone Encounter (Signed)
Patient Name: Melanie Stout Gender: Female DOB: 21-Jan-1986 Age: 35 Y 6 M 22 D Return Phone Number: [phone] Client [facility] Day - Client Client Site [facility] - Day Physician Copland, Shanda Bumps- MD Contact Type Call Who Is Calling Patient / Member / Family / Caregiver Call Type Triage / Clinical Relationship To Patient Self Return Phone Number [phone] (Primary) Chief Complaint CHEST PAIN - pain, pressure, heaviness or tightness Reason for Call Symptomatic / Request for Health Information Initial Comment Caller states she tested positive for covid yesterday. She has had symptoms for two days. She is calling back to report changes in symptoms. Her headache has returned. Her chest is hurting. Translation No Nurse Assessment Nurse: Melanee Spry, RN, Beth Date/Time (Eastern Time): 02/12/2021 11:45:30 AM Confirm and document reason for call. If symptomatic, describe symptoms. ---Caller states she tested positive for covid yesterday. She has had symptoms for two days. She is calling back to report changes in symptoms. Her headache has returned. Her chest is hurting: no cardiac hx. Is not constant, but not just when coughing. Pain Level: 5/10 Denies fever. Does the patient have any new or worsening symptoms? ---Yes Will a triage be completed? ---Yes Related visit to physician within the last 2 weeks? ---No Does the PT have any chronic conditions? (i.e. diabetes, asthma, this includes High risk factors for pregnancy, etc.) ---No Is the patient pregnant or possibly pregnant? (Ask all females between the ages of 26-55) ---No Is this a behavioral health or substance abuse call? ---No Guidelines Guideline Title Affirmed Question Affirmed Notes Nurse Date/Time (Eastern Time) COVID-19 - Diagnosed or Suspected SEVERE or constant chest pain or pressure (Exception: Mild central chest pain, Newhart, RN, [facility] 02/12/2021  11:47:32 AM Pt was advised to go to ER.

## 2021-02-13 NOTE — Assessment & Plan Note (Signed)
Patient is interested in antiviral rx.  We do not have up to date Cr for Paxlovid. Will rx with molnupiravir. She is advised to use condoms during treatment and for 4 days after completion of molnupiravir. Advised of CDC guidelines for self isolation/ ending isolation.  Advised of safe practice guidelines. Symptom Tier reviewed.  Encouraged to monitor for any worsening symptoms; watch for increased shortness of breath, weakness, and signs of dehydration. Advised when to seek emergency care.  Instructed to rest and hydrate well.  Advised to leave the house during recommended isolation period, only if it is necessary to seek medical care

## 2021-02-14 ENCOUNTER — Telehealth: Payer: Self-pay | Admitting: *Deleted

## 2021-02-14 NOTE — Telephone Encounter (Signed)
Spoke with pt. She is feeling some better and did receive the molnupiravir rx. She started last night.

## 2021-02-14 NOTE — Telephone Encounter (Signed)
Who Is Calling Patient / Member / Family / Caregiver Call Type Triage / Clinical Caller Name [name] Relationship To Patient Mother Return Phone Number [phone] (Primary) Chief Complaint Prescription Refill or Medication Request (non symptomatic) Reason for Call Medication Question / Request Initial Comment Caller states the pharmacy does not have the prescription that the doctor said she called in today. Nurse Assessment Guidelines Guideline Title Affirmed Question Disp. Time Melanie Stout Time) Disposition Final User 02/14/2021 8:01:27 AM FINAL ATTEMPT MADE - message left Yes Freida Busman, RN, Legrand Rams

## 2021-03-20 ENCOUNTER — Encounter: Payer: Self-pay | Admitting: Family Medicine

## 2021-03-20 DIAGNOSIS — F4323 Adjustment disorder with mixed anxiety and depressed mood: Secondary | ICD-10-CM

## 2021-03-28 MED ORDER — FLUOXETINE HCL 20 MG PO TABS: 20.0000 mg | ORAL_TABLET | Freq: Every day | ORAL | 1 refills | Status: DC

## 2021-03-28 MED ORDER — DEXLANSOPRAZOLE 60 MG PO CPDR: 60.0000 mg | DELAYED_RELEASE_CAPSULE | Freq: Every day | ORAL | 3 refills | Status: DC

## 2021-03-28 NOTE — Telephone Encounter (Signed)
Call patient to go over somewhat accommodations paperwork.  She would like to work from home for couple of reasons  She tested positive for COVID about September 11 of this year.  Since that time she has continued to have symptoms of body aches, headaches and fatigue.  Working from home eases her symptoms because she can adjust to position and lighting more easily.  She also has a 56-month-old baby at home, she does worry about potentially bringing COVID or other illnesses home to him  In addition, she would like to start back on her fluoxetine for anxiety.  She is no longer breast-feeding.  I will refill her fluoxetine, I did ask her to make an appointment to see me in the next month or so as it has been over a year.  I also refilled her Dexilant

## 2021-03-29 ENCOUNTER — Telehealth: Payer: Self-pay | Admitting: Family Medicine

## 2021-03-29 ENCOUNTER — Encounter: Payer: Self-pay | Admitting: Family Medicine

## 2021-03-29 MED ORDER — PANTOPRAZOLE SODIUM 40 MG PO TBEC
40.0000 mg | DELAYED_RELEASE_TABLET | Freq: Every day | ORAL | 3 refills | Status: DC
Start: 2021-03-29 — End: 2022-02-19

## 2021-03-29 NOTE — Telephone Encounter (Signed)
Okay to change Rx? ° °

## 2021-03-29 NOTE — Telephone Encounter (Signed)
Melanie Stout from [facility] pharmacy stated insurance does not cover dexlansoprazole. A new prescription needs to be written for pantoprazole and sent to pharmacy. Any questions please contact [phone].  [facility] [address] [place], [place] - [address]  [address] [place] [address]  Phone:  [phone]  Fax:  [phone]

## 2021-04-02 ENCOUNTER — Encounter: Payer: Self-pay | Admitting: Family Medicine

## 2021-05-10 NOTE — Progress Notes (Deleted)
Healthcare at [facility] [address] [place], [place] [address] [phone]  Date:  05/14/2021   Name:  Melanie Stout   DOB:  1985/10/24   MRN:  [id number]  PCP:  Pearline Cables, MD    Chief Complaint: No chief complaint on file.   History of Present Illness:  Melanie Stout is a 35 y.o. very pleasant female patient who presents with the following:  Patient seen today with musculoskeletal concerns, needs flu shot History of hyperlipidemia, obesity, hidradenitis suppurativa  Most recent visit with myself was in May of last year She contact me towards the end of October with desire to work from home, needing paperwork: She tested positive for COVID about September 11 of this year.  Since that time she has continued to have symptoms of body aches, headaches and fatigue.  Working from home eases her symptoms because she can adjust to position and lighting more easily.  She also has a 86-month-old baby at home, she does worry about potentially bringing COVID or other illnesses home to him In addition, she would like to start back on her fluoxetine for anxiety.  She is no longer breast-feeding.  I will refill her fluoxetine, I did ask her to make an appointment to see me in the next month or so as it has been over a year.    Patient Active Problem List   Diagnosis Date Noted   COVID-19 virus infection 02/13/2021   Obesity, unspecified obesity severity, unspecified obesity type 08/01/2016   Tobacco use disorder 06/05/2016    Past Medical History:  Diagnosis Date   Abnormal Pap smear    Blood transfusion without reported diagnosis 01/05/2013   Headache(784.0)    Hx of varicella    Hyperlipidemia     Past Surgical History:  Procedure Laterality Date   NOSE SURGERY      Social History   Tobacco Use   Smoking status: Former    Packs/day: 0.50    Types: Cigarettes    Quit date: 07/24/2016     Years since quitting: 4.7   Smokeless tobacco: Never  Vaping Use   Vaping Use: Never used  Substance Use Topics   Alcohol use: Yes    Comment: occ   Drug use: No    Family History  Problem Relation Age of Onset   Hypertension Mother    Parkinson's disease Father    Diabetes Maternal Aunt     No Known Allergies  Medication list has been reviewed and updated.  Current Outpatient Medications on File Prior to Visit  Medication Sig Dispense Refill   acetaminophen (TYLENOL) 500 MG tablet Take 500 mg by mouth every 6 (six) hours as needed.     FLUoxetine (PROZAC) 20 MG tablet Take 1 tablet (20 mg total) by mouth daily. Increase to 2 pills (40 mg) after 2 weeks 60 tablet 1   pantoprazole (PROTONIX) 40 MG tablet Take 1 tablet (40 mg total) by mouth daily. 30 tablet 3   No current facility-administered medications on file prior to visit.    Review of Systems:  As per HPI- otherwise negative.   Physical Examination: There were no vitals filed for this visit. There were no vitals filed for this visit. There is no height or weight on file to calculate BMI. Ideal Body Weight:    GEN: no acute distress. HEENT: Atraumatic, Normocephalic.  Ears and Nose: No external deformity. CV: RRR, No  M/G/R. No JVD. No thrill. No extra heart sounds. PULM: CTA B, no wheezes, crackles, rhonchi. No retractions. No resp. distress. No accessory muscle use. ABD: S, NT, ND, +BS. No rebound. No HSM. EXTR: No c/c/e PSYCH: Normally interactive. Conversant.    Assessment and Plan: ***  Signed Abbe Amsterdam, MD

## 2021-05-14 ENCOUNTER — Ambulatory Visit: Payer: 59 | Admitting: Family Medicine

## 2021-05-15 NOTE — Progress Notes (Addendum)
[facility] [address] [place], [place] [address] [phone]  Date:  05/16/2021   Name:  Melanie Stout   DOB:  [birth year]   MRN:  [id number]  PCP:  Pearline Cables, MD    Chief Complaint: Pain (Pt c/o back, neck and bilateral legs. Heat makes it better for a little while. Bending makes the back pain worse. /Flu shot today: yes)   History of Present Illness:  Melanie Stout is a 35 y.o. very pleasant female patient who presents with the following:  Pt seen today with concern of body pains and needing a flu shot Last seen by myself 5/21 History of obesity, anxiety/ depression and hidradenitis suppurativa   Flu shot today- give today  Most recent labs 5/21- can update today   Today pt notes she is having pain in her back and both legs- the left more than the right  The pains will come and go She has noticed the pains or about 2 months A hot bath or resting, taking excedrin may help The pain originated in her back and then went into her legs Never had this in the past NK[facility]- however she notes she has been living in a place with uneven floors and sleeping on a slanted surface.  She just recently moved to a new place with level floors and she hopes this will help  No numbness of her legs, no bowel or bladder incontinence but she does feel like both legs are a bit weaker than normal for her   Patient Active Problem List   Diagnosis Date Noted   COVID-19 virus infection 02/13/2021   Obesity, unspecified obesity severity, unspecified obesity type 08/01/2016   Tobacco use disorder 06/05/2016    Past Medical History:  Diagnosis Date   Abnormal Pap smear    Blood transfusion without reported diagnosis 01/05/2013   Headache(784.0)    Hx of varicella    Hyperlipidemia     Past Surgical History:  Procedure Laterality Date   NOSE SURGERY      Social History   Tobacco Use   Smoking  status: Former    Packs/day: 0.50    Types: Cigarettes    Quit date: 07/24/2016    Years since quitting: 4.8   Smokeless tobacco: Never  Vaping Use   Vaping Use: Never used  Substance Use Topics   Alcohol use: Yes    Comment: occ   Drug use: No    Family History  Problem Relation Age of Onset   Hypertension Mother    Parkinson's disease Father    Diabetes Maternal Aunt     No Known Allergies  Medication list has been reviewed and updated.  Current Outpatient Medications on File Prior to Visit  Medication Sig Dispense Refill   acetaminophen (TYLENOL) 500 MG tablet Take 500 mg by mouth every 6 (six) hours as needed.     FLUoxetine (PROZAC) 20 MG tablet Take 1 tablet (20 mg total) by mouth daily. Increase to 2 pills (40 mg) after 2 weeks 60 tablet 1   pantoprazole (PROTONIX) 40 MG tablet Take 1 tablet (40 mg total) by mouth daily. 30 tablet 3   No current facility-administered medications on file prior to visit.    Review of Systems:  As per HPI- otherwise negative.   Physical Examination: Vitals:   05/16/21 1527  BP: 122/82  Pulse: 86  Resp: 18  Temp: 98.3 F (36.8  C)  SpO2: 98%   Vitals:   05/16/21 1527  Weight: 264 lb 6.4 oz (119.9 kg)   Body mass index is 42.68 kg/m. Ideal Body Weight:    GEN: no acute distress. Obese, looks well HEENT: Atraumatic, Normocephalic.  Ears and Nose: No external deformity. CV: RRR, No M/G/R. No JVD. No thrill. No extra heart sounds. PULM: CTA B, no wheezes, crackles, rhonchi. No retractions. No resp. distress. No accessory muscle use. ABD: S, NT, ND EXTR: No c/c/e PSYCH: Normally interactive. Conversant.  Patient notes is tenderness at the midline of her lower back into the left gluteal area Bilateral lower extremity display normal strength, sensation, DTR.  Negative straight leg raise.  Feet are warm and well-perfused with strong pulses   Assessment and Plan: Acute bilateral low back pain with left-sided sciatica -  Plan: DG Lumbar Spine Complete, cyclobenzaprine (FLEXERIL) 10 MG tablet  Elevated blood sugar - Plan: Comprehensive metabolic panel, Hemoglobin A1c  Screening for deficiency anemia - Plan: CBC  Screening for lipid disorders - Plan: Lipid panel  Fatigue, unspecified type - Plan: TSH, VITAMIN D 25 Hydroxy (Vit-D Deficiency, Fractures)  Immunization due - Plan: Flu Vaccine QUAD 6+ mos PF IM (Fluarix Quad PF)  Routine labs are pending as above.  Flu shot given Patient notes lower back pain which radiates into her legs, more so on the left.  She would like to get plain films today which we can certainly do.  Denies any concern of pregnancy Assuming her films are benign, plan to treat with Flexeril as needed.  Can move onto physical therapy or other treatments if symptoms persist  Signed Abbe Amsterdam, MD  Addendum 12/15, received her labs as below.  Message to patient Also, called patient, I had not realize she gave birth 6 months ago.  She notes that she is not breast-feeding  Results for orders placed or performed in visit on 05/16/21  CBC  Result Value Ref Range   WBC 6.9 4.0 - 10.5 K/uL   RBC 4.79 3.87 - 5.11 Mil/uL   Platelets 345.0 150.0 - 400.0 K/uL   Hemoglobin 12.0 12.0 - 15.0 g/dL   HCT 85.8 85.0 - 27.7 %   MCV 78.3 78.0 - 100.0 fl   MCHC 31.9 30.0 - 36.0 g/dL   RDW 41.2 (H) 87.8 - 67.6 %  Comprehensive metabolic panel  Result Value Ref Range   Sodium 137 135 - 145 mEq/L   Potassium 3.9 3.5 - 5.1 mEq/L   Chloride 106 96 - 112 mEq/L   CO2 24 19 - 32 mEq/L   Glucose, Bld 78 70 - 99 mg/dL   BUN 10 6 - 23 mg/dL   Creatinine, Ser 7.20 0.40 - 1.20 mg/dL   Total Bilirubin 0.3 0.2 - 1.2 mg/dL   Alkaline Phosphatase 78 39 - 117 U/L   AST 12 0 - 37 U/L   ALT 10 0 - 35 U/L   Total Protein 6.8 6.0 - 8.3 g/dL   Albumin 4.1 3.5 - 5.2 g/dL   GFR 947.09 >62.83 mL/min   Calcium 9.0 8.4 - 10.5 mg/dL  Hemoglobin M6Q  Result Value Ref Range   Hgb A1c MFr Bld 5.4 4.6 - 6.5 %   Lipid panel  Result Value Ref Range   Cholesterol 149 0 - 200 mg/dL   Triglycerides 94.7 0.0 - 149.0 mg/dL   HDL 65.46 >50.35 mg/dL   VLDL 46.5 0.0 - 68.1 mg/dL   LDL Cholesterol 88 0 - 99 mg/dL  Total CHOL/HDL Ratio 3    NonHDL 105.50   TSH  Result Value Ref Range   TSH 0.75 0.35 - 5.50 uIU/mL  VITAMIN D 25 Hydroxy (Vit-D Deficiency, Fractures)  Result Value Ref Range   VITD 8.92 (L) 30.00 - 100.00 ng/mL

## 2021-05-16 ENCOUNTER — Encounter: Payer: Self-pay | Admitting: Family Medicine

## 2021-05-16 ENCOUNTER — Other Ambulatory Visit: Payer: Self-pay

## 2021-05-16 ENCOUNTER — Ambulatory Visit (INDEPENDENT_AMBULATORY_CARE_PROVIDER_SITE_OTHER): Payer: 59 | Admitting: Family Medicine

## 2021-05-16 ENCOUNTER — Ambulatory Visit (HOSPITAL_BASED_OUTPATIENT_CLINIC_OR_DEPARTMENT_OTHER)
Admission: RE | Admit: 2021-05-16 | Discharge: 2021-05-16 | Disposition: A | Discharge status: Home / Self Care | Payer: 59 | Source: Ambulatory Visit | Attending: Family Medicine | Admitting: Family Medicine

## 2021-05-16 VITALS — BP 122/82 | HR 86 | Temp 98.3°F | Resp 18 | Wt 264.4 lb

## 2021-05-16 DIAGNOSIS — Z23 Encounter for immunization: Secondary | ICD-10-CM | POA: Diagnosis not present

## 2021-05-16 DIAGNOSIS — Z130 Encounter for screening for diseases of the blood and blood-forming organs and certain disorders involving the immune mechanism: Secondary | ICD-10-CM

## 2021-05-16 DIAGNOSIS — Z13220 Encounter for screening for lipoid disorders: Secondary | ICD-10-CM | POA: Diagnosis not present

## 2021-05-16 DIAGNOSIS — R739 Hyperglycemia, unspecified: Secondary | ICD-10-CM

## 2021-05-16 DIAGNOSIS — R5383 Other fatigue: Secondary | ICD-10-CM

## 2021-05-16 DIAGNOSIS — E559 Vitamin D deficiency, unspecified: Secondary | ICD-10-CM

## 2021-05-16 DIAGNOSIS — M5442 Lumbago with sciatica, left side: Secondary | ICD-10-CM

## 2021-05-16 MED ORDER — CYCLOBENZAPRINE HCL 10 MG PO TABS: 10.0000 mg | ORAL_TABLET | Freq: Two times a day (BID) | ORAL | 0 refills | Status: DC | PRN

## 2021-05-16 NOTE — Patient Instructions (Signed)
It was good to see you again today- I will be in touch with your labs and your x-ray report Try using the flexeril as needed for back pain- it will make you sleepy however Please let me know if your back is not feeling better over the next few days- Sooner if worse.

## 2021-05-17 ENCOUNTER — Encounter: Payer: Self-pay | Admitting: Family Medicine

## 2021-05-17 LAB — LIPID PANEL
Cholesterol: 149 mg/dL (ref 0–200)
HDL: 43.9 mg/dL (ref >39.00)
LDL Cholesterol: 88 mg/dL (ref 0–99)
NonHDL: 105.5
Total CHOL/HDL Ratio: 3
Triglycerides: 86 mg/dL (ref 0.0–149.0)
VLDL: 17.2 mg/dL (ref 0.0–40.0)

## 2021-05-17 LAB — COMPREHENSIVE METABOLIC PANEL
ALT: 10 U/L (ref 0–35)
AST: 12 U/L (ref 0–37)
Albumin: 4.1 g/dL (ref 3.5–5.2)
Alkaline Phosphatase: 78 U/L (ref 39–117)
BUN: 10 mg/dL (ref 6–23)
CO2: 24 mEq/L (ref 19–32)
Calcium: 9 mg/dL (ref 8.4–10.5)
Chloride: 106 mEq/L (ref 96–112)
Creatinine, Ser: 0.71 mg/dL (ref 0.40–1.20)
GFR: 109.09 mL/min (ref >60.00)
Glucose, Bld: 78 mg/dL (ref 70–99)
Potassium: 3.9 mEq/L (ref 3.5–5.1)
Sodium: 137 mEq/L (ref 135–145)
Total Bilirubin: 0.3 mg/dL (ref 0.2–1.2)
Total Protein: 6.8 g/dL (ref 6.0–8.3)

## 2021-05-17 LAB — CBC
HCT: 37.5 % (ref 36.0–46.0)
Hemoglobin: 12 g/dL (ref 12.0–15.0)
MCHC: 31.9 g/dL (ref 30.0–36.0)
MCV: 78.3 fl (ref 78.0–100.0)
Platelets: 345 10*3/uL (ref 150.0–400.0)
RBC: 4.79 Mil/uL (ref 3.87–5.11)
RDW: 16.8 % — ABNORMAL HIGH (ref 11.5–15.5)
WBC: 6.9 10*3/uL (ref 4.0–10.5)

## 2021-05-17 LAB — VITAMIN D 25 HYDROXY (VIT D DEFICIENCY, FRACTURES): VITD: 8.92 ng/mL — ABNORMAL LOW (ref 30.00–100.00)

## 2021-05-17 LAB — TSH: TSH: 0.75 u[IU]/mL (ref 0.35–5.50)

## 2021-05-17 LAB — HEMOGLOBIN A1C: Hgb A1c MFr Bld: 5.4 % (ref 4.6–6.5)

## 2021-05-17 MED ORDER — VITAMIN D3 1.25 MG (50000 UT) PO CAPS: ORAL_CAPSULE | ORAL | 0 refills | Status: DC

## 2021-05-17 NOTE — Addendum Note (Signed)
Addended by: Abbe Amsterdam C on: 05/17/2021 12:39 PM   Modules accepted: Orders

## 2021-05-18 ENCOUNTER — Encounter: Payer: Self-pay | Admitting: Family Medicine

## 2021-08-03 ENCOUNTER — Other Ambulatory Visit (HOSPITAL_BASED_OUTPATIENT_CLINIC_OR_DEPARTMENT_OTHER): Payer: Self-pay

## 2021-08-03 ENCOUNTER — Encounter: Payer: Self-pay | Admitting: Family Medicine

## 2021-08-03 DIAGNOSIS — F4323 Adjustment disorder with mixed anxiety and depressed mood: Secondary | ICD-10-CM

## 2021-08-03 DIAGNOSIS — M5442 Lumbago with sciatica, left side: Secondary | ICD-10-CM

## 2021-08-03 MED ORDER — FLUOXETINE HCL 20 MG PO TABS
20.0000 mg | ORAL_TABLET | Freq: Every day | ORAL | 1 refills | Status: DC
  Filled 2021-08-03 – 2021-10-02 (×2): qty 60, 30d supply, fill #0
  Filled 2021-10-17: qty 60, 60d supply, fill #0

## 2021-08-03 MED ORDER — CYCLOBENZAPRINE HCL 10 MG PO TABS
10.0000 mg | ORAL_TABLET | Freq: Two times a day (BID) | ORAL | 0 refills | Status: DC | PRN
  Filled 2021-08-03: qty 30, 15d supply, fill #0

## 2021-08-07 NOTE — Progress Notes (Deleted)
[facility] conservation [facility] at [facility] ?[address], [address] ?[place], [place] [address] ?336 [phone] ?Fax [phone] ? ?Date:  08/08/2021  ? ?Name:  Roniyah Llorens   DOB:  06-05-1985   MRN:  [id number] ? ?PCP:  Pearline Cables, MD  ? ? ?Chief Complaint: No chief complaint on file. ? ? ?History of Present Illness: ? ?Kahmya Aquila Menzie is a 36 y.o. very pleasant female patient who presents with the following: ? ?Patient seen today for follow-up- History of obesity, anxiety/ depression and hidradenitis suppurativa  ?She is bothered by itching and difficulty sleeping ?Most recent visit with myself was in December with concern of lower back pain ? ?Pap smear ?COVID-19 booster ?Labs done in December-CMP, lipid, CBC, vitamin D-low, A1c normal, TSH normal ?Patient Active Problem List  ? Diagnosis Date Noted  ? COVID-19 virus infection 02/13/2021  ? Obesity, unspecified obesity severity, unspecified obesity type 08/01/2016  ? Tobacco use disorder 06/05/2016  ? ? ?Past Medical History:  ?Diagnosis Date  ? Abnormal Pap smear   ? Blood transfusion without reported diagnosis 01/05/2013  ? Headache(784.0)   ? Hx of varicella   ? Hyperlipidemia   ? ? ?Past Surgical History:  ?Procedure Laterality Date  ? NOSE SURGERY    ? ? ?Social History  ? ?Tobacco Use  ? Smoking status: Former  ?  Packs/day: 0.50  ?  Types: Cigarettes  ?  Quit date: 07/24/2016  ?  Years since quitting: 5.0  ? Smokeless tobacco: Never  ?Vaping Use  ? Vaping Use: Never used  ?Substance Use Topics  ? Alcohol use: Yes  ?  Comment: occ  ? Drug use: No  ? ? ?Family History  ?Problem Relation Age of Onset  ? Hypertension Mother   ? Parkinson's disease Father   ? Diabetes Maternal Aunt   ? ? ?No Known Allergies ? ?Medication list has been reviewed and updated. ? ?Current Outpatient Medications on File Prior to Visit  ?Medication Sig Dispense Refill  ? acetaminophen (TYLENOL) 500 MG tablet Take 500 mg by mouth every 6 (six) hours as  needed.    ? Cholecalciferol (VITAMIN D3) 1.25 MG (50000 UT) CAPS Take 1 weekly for 12 weeks 12 capsule 0  ? cyclobenzaprine (FLEXERIL) 10 MG tablet Take 1 tablet (10 mg total) by mouth 2 (two) times daily as needed for muscle spasms. 30 tablet 0  ? FLUoxetine (PROZAC) 20 MG tablet Take 1 tablet (20 mg total) by mouth daily. Increase to 2 tablets (40 mg) by mouth after 2 weeks 60 tablet 1  ? pantoprazole (PROTONIX) 40 MG tablet Take 1 tablet (40 mg total) by mouth daily. 30 tablet 3  ? ?No current facility-administered medications on file prior to visit.  ? ? ?Review of Systems: ? ?As per HPI- otherwise negative. ? ? ?Physical Examination: ?There were no vitals filed for this visit. ?There were no vitals filed for this visit. ?There is no height or weight on file to calculate BMI. ?Ideal Body Weight:   ? ?GEN: no acute distress. ?HEENT: Atraumatic, Normocephalic.  ?Ears and Nose: No external deformity. ?CV: RRR, No M/G/R. No JVD. No thrill. No extra heart sounds. ?PULM: CTA B, no wheezes, crackles, rhonchi. No retractions. No resp. distress. No accessory muscle use. ?ABD: S, NT, ND, +BS. No rebound. No HSM. ?EXTR: No c/c/e ?PSYCH: Normally interactive. Conversant.  ? ? ?Assessment and Plan: ?*** ? ?Signed ?Abbe Amsterdam, MD ? ?

## 2021-08-08 ENCOUNTER — Ambulatory Visit: Payer: 59 | Admitting: Family Medicine

## 2021-08-09 ENCOUNTER — Ambulatory Visit: Payer: 59 | Admitting: Family Medicine

## 2021-08-09 VITALS — BP 110/72 | HR 75 | Temp 98.3°F | Resp 18 | Ht 66.0 in | Wt 274.0 lb

## 2021-08-09 DIAGNOSIS — T148XXA Other injury of unspecified body region, initial encounter: Secondary | ICD-10-CM

## 2021-08-09 DIAGNOSIS — W57XXXA Bitten or stung by nonvenomous insect and other nonvenomous arthropods, initial encounter: Secondary | ICD-10-CM

## 2021-08-09 MED ORDER — HYDROXYZINE PAMOATE 25 MG PO CAPS: 25.0000 mg | ORAL_CAPSULE | Freq: Three times a day (TID) | ORAL | 0 refills | Status: DC | PRN

## 2021-08-09 MED ORDER — TRIAMCINOLONE ACETONIDE 0.5 % EX OINT: 1.0000 "application " | TOPICAL_OINTMENT | Freq: Two times a day (BID) | CUTANEOUS | 0 refills | Status: DC

## 2021-08-09 NOTE — Patient Instructions (Addendum)
I am sorry you were exposed to bed bugs- however it seems like you are not getting new bites which means you likely did not bring them home with you!     ? ?Perhaps check out the [employer] bed bugs video for some good info about how to prevent and get rid of these pests.  ? ?Try the triamcinolone ointment on your bites- avoid using on your face however ?You can also try using the hydroxyzine for sleep and itching- it will make you sleepy so keep this in mind  ?

## 2021-08-09 NOTE — Progress Notes (Signed)
[facility] conservation officer at [facility] ?[address], [address] ?[place], [place] [address] ?336 [phone] ?Fax [phone] ? ?Date:  08/09/2021  ? ?Name:  Melanie Stout   DOB:  [birth year]   MRN:  [id number] ? ?PCP:  Pearline Cables, MD  ? ? ?Chief Complaint: Pruritis (Pt says she stayed [place] and was eaten by bed bugs. ) and Insomnia (She has not been able to sleep well since being back home from her trip. ) ? ? ?History of Present Illness: ? ?Melanie Stout is a 36 y.o. very pleasant female patient who presents with the following: ? ?Pt last seen by myself in December with back pain ?History of obesity, hyperlipidemia ?She recently stayed in a cabin with her son and boyfriend and got bitten by bed bugs about 2 weeks ago  ?She does not think she is getting any new bites- however the bites she has still itch.  She has about 5 bites on her.  Her boyfriend got numerous bites all over his back ? ?They slept 1 night at the cabin, in the morning they noticed bites and she was able to see bedbugs on the bedding.  She shows me photos and indeed these are bedbugs ?They left all of their belongings at the cabin, did not bring something home with them in hopes of not bringing the bedbugs home ? ?No one has noticed any new bites so we are hopeful that the bedbugs did not come to their home ? ?She is having a hard time sleeping since this happened. She will wake up feeling itchy and upset, she is afraid that she feels something crawling on her ?Pap screening - per her GYN  ?Patient Active Problem List  ? Diagnosis Date Noted  ? COVID-19 virus infection 02/13/2021  ? Obesity, unspecified obesity severity, unspecified obesity type 08/01/2016  ? Tobacco use disorder 06/05/2016  ? ? ?Past Medical History:  ?Diagnosis Date  ? Abnormal Pap smear   ? Blood transfusion without reported diagnosis 01/05/2013  ? Headache(784.0)   ? Hx of varicella   ? Hyperlipidemia   ? ? ?Past Surgical  History:  ?Procedure Laterality Date  ? NOSE SURGERY    ? ? ?Social History  ? ?Tobacco Use  ? Smoking status: Former  ?  Packs/day: 0.50  ?  Types: Cigarettes  ?  Quit date: 07/24/2016  ?  Years since quitting: 5.0  ? Smokeless tobacco: Never  ?Vaping Use  ? Vaping Use: Never used  ?Substance Use Topics  ? Alcohol use: Yes  ?  Comment: occ  ? Drug use: No  ? ? ?Family History  ?Problem Relation Age of Onset  ? Hypertension Mother   ? Parkinson's disease Father   ? Diabetes Maternal Aunt   ? ? ?No Known Allergies ? ?Medication list has been reviewed and updated. ? ?Current Outpatient Medications on File Prior to Visit  ?Medication Sig Dispense Refill  ? acetaminophen (TYLENOL) 500 MG tablet Take 500 mg by mouth every 6 (six) hours as needed.    ? Cholecalciferol (VITAMIN D3) 1.25 MG (50000 UT) CAPS Take 1 weekly for 12 weeks 12 capsule 0  ? cyclobenzaprine (FLEXERIL) 10 MG tablet Take 1 tablet (10 mg total) by mouth 2 (two) times daily as needed for muscle spasms. 30 tablet 0  ? FLUoxetine (PROZAC) 20 MG tablet Take 1 tablet (20 mg total) by mouth daily. Increase to 2 tablets (40 mg)  by mouth after 2 weeks 60 tablet 1  ? pantoprazole (PROTONIX) 40 MG tablet Take 1 tablet (40 mg total) by mouth daily. 30 tablet 3  ? ?No current facility-administered medications on file prior to visit.  ? ? ?Review of Systems: ? ?As per HPI- otherwise negative. ? ? ?Physical Examination: ?Vitals:  ? 08/09/21 1040  ?BP: 110/72  ?Pulse: 75  ?Resp: 18  ?Temp: 98.3 ?F (36.8 ?C)  ?SpO2: 99%  ? ?Vitals:  ? 08/09/21 1040  ?Weight: 274 lb (124.3 kg)  ?Height: 5\' 6"  (1.676 m)  ? ?Body mass index is 44.22 kg/m?. ?Ideal Body Weight: Weight in (lb) to have BMI = 25: 154.6 ? ?GEN: no acute distress. ?HEENT: Atraumatic, Normocephalic.  Bilateral TM wnl, oropharynx normal.  PEERL,EOMI.   ?Ears and Nose: No external deformity. ?CV: RRR, No M/G/R. No JVD. No thrill. No extra heart sounds. ?PULM: CTA B, no wheezes, crackles, rhonchi. No retractions. No  resp. distress. No accessory muscle use. ?EXTR: No c/c/e ?PSYCH: Normally interactive. Conversant.  ?Obese, looks well ?Patient has a few nearly healed insect bites on her hand, arm and back ? ?Assessment and Plan: ?Insect bite, unspecified site, initial encounter - Plan: triamcinolone ointment (KENALOG) 0.5 %, hydrOXYzine (VISTARIL) 25 MG capsule ? ?Patient seen today for follow-up from recent bedbug bites.  Thankfully it does not appear that the bugs are present in her own home, but she is still seeing some psychological stress from this incident ?Provided triamcinolone to apply topically to itchy bites, hydroxyzine use as needed for itching and sleep ? ?We discussed some effective strategies for preventing and treating bedbugs and I directed her towards a very informative YouTube video all about bedbugs which may be helpful ? ?She will let me know if I can provide any further assistance ? ?Signed ? , MD ? ?

## 2021-08-10 ENCOUNTER — Other Ambulatory Visit (HOSPITAL_BASED_OUTPATIENT_CLINIC_OR_DEPARTMENT_OTHER): Payer: Self-pay

## 2021-08-21 LAB — OB RESULTS CONSOLE ABO/RH: RH Type: POSITIVE

## 2021-08-21 LAB — HEPATITIS C ANTIBODY: HCV Ab: NEGATIVE

## 2021-08-21 LAB — OB RESULTS CONSOLE ANTIBODY SCREEN: Antibody Screen: NEGATIVE

## 2021-08-21 LAB — OB RESULTS CONSOLE RPR: RPR: NONREACTIVE

## 2021-08-21 LAB — OB RESULTS CONSOLE HIV ANTIBODY (ROUTINE TESTING): HIV: NONREACTIVE

## 2021-08-21 LAB — OB RESULTS CONSOLE RUBELLA ANTIBODY, IGM: Rubella: IMMUNE

## 2021-08-21 LAB — OB RESULTS CONSOLE HEPATITIS B SURFACE ANTIGEN: Hepatitis B Surface Ag: NEGATIVE

## 2021-08-30 LAB — OB RESULTS CONSOLE GC/CHLAMYDIA
Chlamydia: NEGATIVE
Neisseria Gonorrhea: NEGATIVE

## 2021-09-05 ENCOUNTER — Other Ambulatory Visit: Payer: Self-pay | Admitting: Family Medicine

## 2021-09-05 DIAGNOSIS — F4323 Adjustment disorder with mixed anxiety and depressed mood: Secondary | ICD-10-CM

## 2021-09-24 ENCOUNTER — Telehealth: Payer: Self-pay | Admitting: Family Medicine

## 2021-09-24 NOTE — Telephone Encounter (Signed)
Pt called stating that they would like a copy of their medical records from 2.1.23 to present. Pt also stated they would like a call to indicate they are available to pick up.  ?

## 2021-09-25 NOTE — Telephone Encounter (Signed)
Called pt to let her know that she only has one OV here with [place] in that time frame (08/09/21). Also to see if she is able to access her MyChart-if so she can get her visit notes that way to avoid her having to travel to the office.  ?

## 2021-10-02 ENCOUNTER — Other Ambulatory Visit (HOSPITAL_BASED_OUTPATIENT_CLINIC_OR_DEPARTMENT_OTHER): Payer: Self-pay

## 2021-10-02 ENCOUNTER — Other Ambulatory Visit: Payer: Self-pay | Admitting: Family Medicine

## 2021-10-02 ENCOUNTER — Encounter: Payer: Self-pay | Admitting: Family Medicine

## 2021-10-02 DIAGNOSIS — F4323 Adjustment disorder with mixed anxiety and depressed mood: Secondary | ICD-10-CM

## 2021-10-12 ENCOUNTER — Other Ambulatory Visit (HOSPITAL_BASED_OUTPATIENT_CLINIC_OR_DEPARTMENT_OTHER): Payer: Self-pay

## 2021-10-17 ENCOUNTER — Other Ambulatory Visit (HOSPITAL_BASED_OUTPATIENT_CLINIC_OR_DEPARTMENT_OTHER): Payer: Self-pay

## 2021-10-18 ENCOUNTER — Other Ambulatory Visit (HOSPITAL_BASED_OUTPATIENT_CLINIC_OR_DEPARTMENT_OTHER): Payer: Self-pay

## 2021-10-18 MED ORDER — FLUOXETINE HCL 20 MG PO TABS
20.0000 mg | ORAL_TABLET | Freq: Every day | ORAL | 3 refills | Status: DC
  Filled 2021-10-18: qty 90, 90d supply, fill #0

## 2021-10-18 NOTE — Telephone Encounter (Signed)
Called- LMOM as no answer.  Is she pregnant?  Please let me know so we can discuss fluoxetine during pregnancy

## 2021-11-14 ENCOUNTER — Telehealth: Payer: Self-pay | Admitting: Family Medicine

## 2021-11-14 NOTE — Telephone Encounter (Signed)
Call Type Triage / Clinical Relationship To Patient Self Return Phone Number [phone] (Primary) Chief Complaint Itching Reason for Call Symptomatic / Request for Health Information Initial Comment Caller states that she would like to set up an appointment with her PCP but they asked her to be triaged. She is experience itching all over her body. She has been dealing with this for about two weeks. She has been unable to resolve it on her own. She saw her OBGYN about this and she cleared her of everything on her end. Translation No Nurse Assessment Nurse: Breeding, RN, [place] Date/Time (Eastern Time): 11/14/2021 12:26:11 PM Confirm and document reason for call. If symptomatic, describe symptoms. ---Caller notes she is experience itching all over her body. She has been dealing with this for about two weeks. She has been unable to resolve it on her own. She saw her OBGYN about this and she cleared her of everything on her end. Caller denies any rash but feels like something is crawling on her skin. Denies itchy feet. Arms and legs are bothering her the worse.   11/14/2021 12:30:46 PM Call PCP Now Yes Breeding, RN, [place]

## 2021-11-14 NOTE — Telephone Encounter (Signed)
Pt called for an appt for body itching. Transferred to trigae.

## 2021-11-14 NOTE — Telephone Encounter (Signed)
Spoke with Dr. Patsy Lager about patient and she stated she can pt tomorrow and for the baby not moving she needs to call ob/gyn now.   Spoke with patient and she stated that she has been dealing with this issue for about two weeks.  I asked if she called her ob/gyn about the baby not moving and pt stated that it was normal for the baby.  I advised her that Dr. Patsy Lager said to call her ob/gyn about it now.    Patient scheduled with PCP tomorrow for itching.

## 2021-11-15 ENCOUNTER — Encounter: Payer: Self-pay | Admitting: Family Medicine

## 2021-11-15 ENCOUNTER — Ambulatory Visit: Payer: 59 | Admitting: Family Medicine

## 2021-11-15 VITALS — BP 110/70 | HR 60 | Temp 98.1°F | Resp 18 | Ht 66.0 in | Wt 275.2 lb

## 2021-11-15 DIAGNOSIS — L299 Pruritus, unspecified: Secondary | ICD-10-CM | POA: Diagnosis not present

## 2021-11-15 LAB — CBC
HCT: 36.2 % (ref 36.0–46.0)
Hemoglobin: 11.9 g/dL — ABNORMAL LOW (ref 12.0–15.0)
MCHC: 33 g/dL (ref 30.0–36.0)
MCV: 83.8 fl (ref 78.0–100.0)
Platelets: 281 10*3/uL (ref 150.0–400.0)
RBC: 4.32 Mil/uL (ref 3.87–5.11)
RDW: 14.4 % (ref 11.5–15.5)
WBC: 7.1 10*3/uL (ref 4.0–10.5)

## 2021-11-15 LAB — COMPREHENSIVE METABOLIC PANEL
ALT: 8 U/L (ref 0–35)
AST: 11 U/L (ref 0–37)
Albumin: 3.5 g/dL (ref 3.5–5.2)
Alkaline Phosphatase: 43 U/L (ref 39–117)
BUN: 6 mg/dL (ref 6–23)
CO2: 21 mEq/L (ref 19–32)
Calcium: 8.3 mg/dL — ABNORMAL LOW (ref 8.4–10.5)
Chloride: 105 mEq/L (ref 96–112)
Creatinine, Ser: 0.51 mg/dL (ref 0.40–1.20)
GFR: 119.34 mL/min (ref >60.00)
Glucose, Bld: 91 mg/dL (ref 70–99)
Potassium: 3.9 mEq/L (ref 3.5–5.1)
Sodium: 135 mEq/L (ref 135–145)
Total Bilirubin: 0.2 mg/dL (ref 0.2–1.2)
Total Protein: 5.9 g/dL — ABNORMAL LOW (ref 6.0–8.3)

## 2021-11-15 LAB — TSH: TSH: 1.06 u[IU]/mL (ref 0.35–5.50)

## 2021-11-15 LAB — FERRITIN: Ferritin: 4.5 ng/mL — ABNORMAL LOW (ref 10.0–291.0)

## 2021-11-15 NOTE — Patient Instructions (Signed)
Good to see you today!  I will be in touch with your labs asap Please take it easy on your skin- no harsh soaps or bleach!

## 2021-11-15 NOTE — Progress Notes (Addendum)
[facility] at [facility] [address] [place], [place] [address] [phone]  Date:  11/15/2021   Name:  Melanie Stout   DOB:  [birth year]   MRN:  [id number]  PCP:  Pearline Cables, MD    Chief Complaint: Pruritis (About 3 weeks. Pt says she has been unable to identify any triggers besides the thought of the bed bugs that they encountered on her last trip. /Concerns/ questions: /)   History of Present Illness:  Melanie Stout is a 36 y.o. very pleasant female patient who presents with the following:  Pt contacted [place] with concern of itchy- she is currently pregnant Last seen by myself in March  History of obesity, hyperlipidemia She is due 10/28- expecting a baby girl  PFW- Dr Langston Masker is her doctor  Pt notes she is having itching for about 3 weeks- seems to be all over but most pronounced on her arms and legs.  No rash or bites are noted She has tried various products -she is experimented with various somewhat harsh antibacterial soaps and other agents.  I advised her these are likely not helpful and could be making her skin irritation worse She had some groin itching and was dx with BV- however she is no longer having groin symptoms Benadryl did not seem to help   Pt was exposed to bedbugs earlier this year -she admits this may have caused an anxiety  Also, patient endorses possibly decreased fetal movement over the last week or so.  She states she is only sometimes able to appreciate fetal movement at this point her pregnancy.  She wonders if we can check fetal heart rate, I advised unfortunately we do not have a Doppler available  Patient Active Problem List   Diagnosis Date Noted   COVID-19 virus infection 02/13/2021   Obesity, unspecified obesity severity, unspecified obesity type 08/01/2016   Tobacco use disorder 06/05/2016    Past Medical History:  Diagnosis Date   Abnormal Pap smear    Blood  transfusion without reported diagnosis 01/05/2013   Headache(784.0)    Hx of varicella    Hyperlipidemia     Past Surgical History:  Procedure Laterality Date   NOSE SURGERY      Social History   Tobacco Use   Smoking status: Former    Packs/day: 0.50    Types: Cigarettes    Quit date: 07/24/2016    Years since quitting: 5.3   Smokeless tobacco: Never  Vaping Use   Vaping Use: Never used  Substance Use Topics   Alcohol use: Yes    Comment: occ   Drug use: No    Family History  Problem Relation Age of Onset   Hypertension Mother    Parkinson's disease Father    Diabetes Maternal Aunt     No Known Allergies  Medication list has been reviewed and updated.  Current Outpatient Medications on File Prior to Visit  Medication Sig Dispense Refill   acetaminophen (TYLENOL) 500 MG tablet Take 500 mg by mouth every 6 (six) hours as needed.     Adalimumab (HUMIRA [place]) Inject into the skin.     Cholecalciferol (VITAMIN D3) 1.25 MG (50000 UT) CAPS Take 1 weekly for 12 weeks 12 capsule 0   FLUoxetine (PROZAC) 20 MG tablet Take 1 tablet (20 mg total) by mouth daily. 90 tablet 3   hydrOXYzine (VISTARIL) 25 MG capsule Take 1 capsule (25 mg total) by  mouth every 8 (eight) hours as needed. 30 capsule 0   pantoprazole (PROTONIX) 40 MG tablet Take 1 tablet (40 mg total) by mouth daily. 30 tablet 3   triamcinolone ointment (KENALOG) 0.5 % Apply 1 application. topically 2 (two) times daily. 30 g 0   No current facility-administered medications on file prior to visit.    Review of Systems:  As per HPI- otherwise negative.   Physical Examination: Vitals:   11/15/21 1148  BP: 110/70  Pulse: 60  Resp: 18  Temp: 98.1 F (36.7 C)  SpO2: 98%   Vitals:   11/15/21 1148  Weight: 275 lb 3.2 oz (124.8 kg)  Height: 5\' 6"  (1.676 m)   Body mass index is 44.42 kg/m. Ideal Body Weight: Weight in (lb) to have BMI = 25: 154.6  GEN: no acute distress.  Obese, otherwise looks well HEENT:  Atraumatic, Normocephalic. Bilateral TM wnl, oropharynx normal.  PEERL,EOMI.   Ears and Nose: No external deformity. CV: RRR, No M/G/R. No JVD. No thrill. No extra heart sounds. PULM: CTA B, no wheezes, crackles, rhonchi. No retractions. No resp. distress. No accessory muscle use. ABD: S, NT, ND, +BS. No rebound. No HSM. EXTR: No c/c/e PSYCH: Normally interactive. Conversant.  Skin looks normal, no rashes, bites, other lesions are noted   Assessment and Plan: Itching - Plan: CBC, Comprehensive metabolic panel, Ferritin, TSH  Patient seen today with concern of itching.  Some of this may be related to anxiety from recent bedbug exposure.  However, she reports lab work has not yet been done so we will obtain labs as above.  In the meantime I encouraged her to treat her skin gently.  Limit exposure to hot water, dry immediately after bathing and apply moisturizer as needed  As above, patient states she has noted decreased fetal movement.  She mentioned this yesterday and was advised to call her OB office but she states she did not do so.  She states she thinks she is fine, does not wish to do anything further right now.  I encouraged her to go home and consume a sweetened beverage such as a Sprite.  Then lay quietly and see if she can appreciate fetal movement.  If she does not, I strongly encouraged her to contact her OB for advice.  She states understanding  Signed , MD  Received her labs, message to pt  Results for orders placed or performed in visit on 11/15/21  CBC  Result Value Ref Range   WBC 7.1 4.0 - 10.5 K/uL   RBC 4.32 3.87 - 5.11 Mil/uL   Platelets 281.0 150.0 - 400.0 K/uL   Hemoglobin 11.9 (L) 12.0 - 15.0 g/dL   HCT 11/17/21 11.6 - 57.9 %   MCV 83.8 78.0 - 100.0 fl   MCHC 33.0 30.0 - 36.0 g/dL   RDW 03.8 33.3 - 83.2 %  Comprehensive metabolic panel  Result Value Ref Range   Sodium 135 135 - 145 mEq/L   Potassium 3.9 3.5 - 5.1 mEq/L   Chloride 105 96 - 112  mEq/L   CO2 21 19 - 32 mEq/L   Glucose, Bld 91 70 - 99 mg/dL   BUN 6 6 - 23 mg/dL   Creatinine, Ser 91.9 0.40 - 1.20 mg/dL   Total Bilirubin 0.2 0.2 - 1.2 mg/dL   Alkaline Phosphatase 43 39 - 117 U/L   AST 11 0 - 37 U/L   ALT 8 0 - 35 U/L   Total Protein 5.9 (  L) 6.0 - 8.3 g/dL   Albumin 3.5 3.5 - 5.2 g/dL   GFR 614.43 >15.40 mL/min   Calcium 8.3 (L) 8.4 - 10.5 mg/dL  Ferritin  Result Value Ref Range   Ferritin 4.5 (L) 10.0 - 291.0 ng/mL  TSH  Result Value Ref Range   TSH 1.06 0.35 - 5.50 uIU/mL

## 2021-11-21 ENCOUNTER — Encounter: Payer: Self-pay | Admitting: Family Medicine

## 2021-11-27 ENCOUNTER — Encounter: Payer: Self-pay | Admitting: Family Medicine

## 2022-02-05 ENCOUNTER — Telehealth: Payer: Self-pay

## 2022-02-05 NOTE — Telephone Encounter (Signed)
Left vm to return call.    

## 2022-02-05 NOTE — Telephone Encounter (Signed)
Nurse Assessment Nurse: Delton See, RN, Oren Binet Date/Time Lamount Cohen Time): 02/04/2022 12:36:15 PM Confirm and document reason for call. If symptomatic, describe symptoms. ---Caller states she is calling in to schedule an appt for for a sore throat and cracks in her lips that won't heal that started 3 weeks ago Does the patient have any new or worsening symptoms? ---Yes Will a triage be completed? ---Yes Related visit to physician within the last 2 weeks? ---No Does the PT have any chronic conditions? (i.e. diabetes, asthma, this includes High risk factors for pregnancy, etc.) ---No Is the patient pregnant or possibly pregnant? (Ask all females between the ages of 55-55) ---Yes How many weeks gestation? ---84 What is the estimated delivery date? ---2022-03-30 Total number of pregnancies including current? ---3 Number of live births? ---2 Have you felt decreased fetal movement? ---No Is this a behavioral health or substance abuse call? ---No PLEASE NOTE: All timestamps contained within this report are represented as [place] Standard Time. CONFIDENTIALTY NOTICE: This fax transmission is intended only for the addressee. It contains information that is legally privileged, confidential or otherwise protected from use or disclosure. If you are not the intended recipient, you are strictly prohibited from reviewing, disclosing, copying using or disseminating any of this information or taking any action in reliance on or regarding this information. If you have received this fax in error, please notify us immediately by telephone so that we can arrange for its return to [place]. Phone: [phone], Toll-Free: [phone], Fax: [phone] Page: 2 of 2 Call Id: [id number] Guidelines Guideline Title Affirmed Question Affirmed Notes Nurse Date/Time Lamount Cohen Time) Sore Throat SEVERE (e.g., excruciating) throat pain Delton See, RN, Oren Binet 02/04/2022 12:38:05 PM Disp. Time Lamount Cohen Time) Disposition Final  User 02/04/2022 11:51:57 AM Send to Clinical [place] Dragon, RN, April 02/04/2022 12:40:50 PM See PCP within 24 Hours Yes Delton See, RN, Oren Binet Final Disposition 02/04/2022 12:40:50 PM See PCP within 24 Hours Yes Delton See, RN, Oren Binet Caller Disagree/Comply Comply Caller Understands Yes PreDisposition Call Doctor Care Advice Given Per Guideline SEE PCP WITHIN 24 HOURS: * IF OFFICE WILL BE OPEN: You need to be examined within the next 24 hours. Call your doctor (or NP/PA) when the office opens and make an appointment. SORE THROAT: * Gargle with warm salt water four times a day. To make salt water, put 1/2 teaspoon of salt in 8 oz (240 ml) of warm water. SOFT DIET: * Eat a soft diet. * Cold drinks, popsicles, and milk shakes are especially good. Avoid citrus fruits. CALL BACK IF: * You become worse CARE ADVICE given per Sore Throat (Adult) guideline. Comments User: Midge Aver, RN Date/Time Lamount Cohen Time): 02/04/2022 12:41:28 PM Denies contractions, leakage of fluids from vagina and abd pain Referrals REFERRED TO PCP OFFICE

## 2022-02-18 ENCOUNTER — Emergency Department (HOSPITAL_BASED_OUTPATIENT_CLINIC_OR_DEPARTMENT_OTHER)
Admission: EM | Admit: 2022-02-18 | Discharge: 2022-02-19 | Disposition: A | Discharge status: Home / Self Care | Payer: 59 | Attending: Emergency Medicine | Admitting: Emergency Medicine

## 2022-02-18 ENCOUNTER — Other Ambulatory Visit: Payer: Self-pay

## 2022-02-18 ENCOUNTER — Encounter (HOSPITAL_BASED_OUTPATIENT_CLINIC_OR_DEPARTMENT_OTHER): Payer: Self-pay | Admitting: Emergency Medicine

## 2022-02-18 DIAGNOSIS — Z3A34 34 weeks gestation of pregnancy: Secondary | ICD-10-CM | POA: Diagnosis not present

## 2022-02-18 DIAGNOSIS — J029 Acute pharyngitis, unspecified: Secondary | ICD-10-CM

## 2022-02-18 DIAGNOSIS — L292 Pruritus vulvae: Secondary | ICD-10-CM | POA: Diagnosis not present

## 2022-02-18 DIAGNOSIS — O23593 Infection of other part of genital tract in pregnancy, third trimester: Secondary | ICD-10-CM | POA: Diagnosis present

## 2022-02-18 DIAGNOSIS — N760 Acute vaginitis: Secondary | ICD-10-CM

## 2022-02-18 NOTE — ED Triage Notes (Signed)
Patient arrived via POV c/o sore throat x 1 month, and vaginal itching/burning x 1.5 weeks. Patient is AO x 4, VS WDL, normal gait.

## 2022-02-19 ENCOUNTER — Other Ambulatory Visit (HOSPITAL_BASED_OUTPATIENT_CLINIC_OR_DEPARTMENT_OTHER): Payer: Self-pay

## 2022-02-19 ENCOUNTER — Ambulatory Visit: Payer: 59 | Admitting: Medical

## 2022-02-19 LAB — WET PREP, GENITAL
Clue Cells Wet Prep HPF POC: NONE SEEN
Sperm: NONE SEEN
Trich, Wet Prep: NONE SEEN
WBC, Wet Prep HPF POC: <10 (ref <10)
Yeast Wet Prep HPF POC: NONE SEEN

## 2022-02-19 LAB — GROUP A STREP BY PCR: Group A Strep by PCR: NOT DETECTED

## 2022-02-19 MED ORDER — SUCRALFATE 1 G PO TABS
1.0000 g | ORAL_TABLET | Freq: Three times a day (TID) | ORAL | 0 refills | Status: DC
  Filled 2022-02-19: qty 120, 30d supply, fill #0

## 2022-02-19 MED ORDER — PANTOPRAZOLE SODIUM 40 MG PO TBEC
40.0000 mg | DELAYED_RELEASE_TABLET | Freq: Every day | ORAL | 0 refills | Status: DC
  Filled 2022-02-19: qty 30, 30d supply, fill #0

## 2022-02-19 MED ORDER — VALACYCLOVIR HCL 1 G PO TABS
1000.0000 mg | ORAL_TABLET | Freq: Three times a day (TID) | ORAL | 0 refills | Status: DC
  Filled 2022-02-19: qty 21, 7d supply, fill #0

## 2022-02-19 NOTE — Discharge Instructions (Addendum)
Begin taking Valtrex as prescribed.  Begin taking Carafate as prescribed.    Resume taking Protonix.  This medication has been refilled this evening.  Follow-up with your OB/GYN in the next few days.

## 2022-02-19 NOTE — ED Provider Notes (Signed)
[facility] DEPART[facility] Provider Note   CSN: [id number] Arrival date & time: 02/18/22  2236     History  Chief Complaint  Patient presents with   Sore Throat   Vaginal Itching    Melanie Stout is a 36 y.o. female.  Patient is a 36 year old female [place] at [redacted] weeks gestation.  Patient presenting today with complaints of vaginal burning and irritation.  This has been worsening over the past week.  She did see her GYN 1 week ago, but testing at that time was negative.  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  She is concerned that may be what is happening now.  She denies any new contacts or exposures.  She denies any vaginal bleeding or gush of fluid.  She also reports a 1 month history of sore throat.  This is worse when she wakes up in the morning.  She denies any fevers or chills.  She denies any ill contacts.  The history is provided by the patient.  Vaginal Itching       Home Medications Prior to Admission medications   Medication Sig Start Date End Date Taking? Authorizing Provider  acetaminophen (TYLENOL) 500 MG tablet Take 500 mg by mouth every 6 (six) hours as needed.    [provider]  Adalimumab (HUMIRA [place]) Inject into the skin.    [provider]  Cholecalciferol (VITAMIN D3) 1.25 MG (50000 UT) CAPS Take 1 weekly for 12 weeks 05/17/21   Copland, Gay Filler, MD  FLUoxetine (PROZAC) 20 MG tablet Take 1 tablet (20 mg total) by mouth daily. 10/18/21   Copland, Gay Filler, MD  hydrOXYzine (VISTARIL) 25 MG capsule Take 1 capsule (25 mg total) by mouth every 8 (eight) hours as needed. 08/09/21   Copland, Gay Filler, MD  pantoprazole (PROTONIX) 40 MG tablet Take 1 tablet (40 mg total) by mouth daily. 03/29/21   Copland, Gay Filler, MD  triamcinolone ointment (KENALOG) 0.5 % Apply 1 application. topically 2 (two) times daily. 08/09/21   Copland, Gay Filler, MD      Allergies     Patient has no known allergies.    Review of Systems   Review of Systems  All other systems reviewed and are negative.   Physical Exam Updated Vital Signs BP (!) 133/98   Pulse 91   Temp 98.1 F (36.7 C) (Oral)   Resp 17   Ht 5\' 6"  (1.676 m)   Wt 122.5 kg   SpO2 100%   BMI 43.58 kg/m  Physical Exam Vitals and nursing note reviewed.  Constitutional:      General: She is not in acute distress.    Appearance: She is well-developed. She is not diaphoretic.  HENT:     Head: Normocephalic and atraumatic.     Nose: No congestion.     Mouth/Throat:     Tonsils: No tonsillar exudate or tonsillar abscesses.  Cardiovascular:     Rate and Rhythm: Normal rate and regular rhythm.     Heart sounds: No murmur heard.    No friction rub. No gallop.  Pulmonary:     Effort: Pulmonary effort is normal. No respiratory distress.     Breath sounds: Normal breath sounds. No wheezing.  Abdominal:     General: Bowel sounds are normal. There is no distension.     Palpations: Abdomen is soft.     Tenderness: There is no abdominal tenderness.  Genitourinary:  Comments: There are several bumps noted to the labia majora possibly consistent with HSV.  The vaginal mucosa is otherwise within normal limits. Musculoskeletal:        General: Normal range of motion.     Cervical back: Normal range of motion and neck supple.  Skin:    General: Skin is warm and dry.  Neurological:     General: No focal deficit present.     Mental Status: She is alert and oriented to person, place, and time.     ED Results / Procedures / Treatments   Labs (all labs ordered are listed, but only abnormal results are displayed) Labs Reviewed  WET PREP, GENITAL  GROUP A STREP BY PCR  GC/CHLAMYDIA PROBE AMP ([place]) NOT AT [facility]    EKG None  Radiology No results found.  Procedures Procedures    Medications Ordered in ED Medications - No data to display  ED Course/ Medical Decision Making/  A&P  Patient presenting here with vaginal burning at [redacted] weeks gestation, but no abdominal pain.  She has history of HSV.  Her initial breakout was 5 years ago and has had no flareups since, but is concerned this feels similar.  Her pelvic examination does reveal lesions that could potentially be early vesicles on the labia and I feel as though treatment with valacyclovir is indicated.  The wet prep is otherwise unremarkable and there are no internal lesions noted.  She is also concerned about a sore throat that I suspect is likely related to reflux.  She tells me she ran out of her Protonix.  She also tells me that these symptoms are worse when she wakes up in the morning, but seem to improve throughout the day.  I will add Carafate and refill her Protonix.  Patient to follow-up with her GYN.  Final Clinical Impression(s) / ED Diagnoses Final diagnoses:  None    Rx / DC Orders ED Discharge Orders     None         Veryl Speak, MD 02/19/22 [phone]

## 2022-02-20 LAB — GC/CHLAMYDIA PROBE AMP (~~LOC~~) NOT AT ARMC
Chlamydia: NEGATIVE
Comment: NEGATIVE
Comment: NORMAL
Neisseria Gonorrhea: NEGATIVE

## 2022-03-05 LAB — OB RESULTS CONSOLE GBS: GBS: POSITIVE

## 2022-03-20 ENCOUNTER — Telehealth (HOSPITAL_COMMUNITY): Payer: Self-pay | Admitting: *Deleted

## 2022-03-20 ENCOUNTER — Encounter (HOSPITAL_COMMUNITY): Payer: Self-pay | Admitting: *Deleted

## 2022-03-20 NOTE — Telephone Encounter (Signed)
Preadmission screen  

## 2022-03-21 ENCOUNTER — Encounter (HOSPITAL_COMMUNITY): Payer: Self-pay

## 2022-03-22 ENCOUNTER — Telehealth (HOSPITAL_COMMUNITY): Payer: Self-pay | Admitting: *Deleted

## 2022-03-22 NOTE — Telephone Encounter (Signed)
Preadmission screen  

## 2022-03-25 ENCOUNTER — Inpatient Hospital Stay (HOSPITAL_COMMUNITY)
Admission: AD | Admit: 2022-03-25 | Discharge: 2022-03-30 | DRG: 788 | Disposition: A | Discharge status: Home / Self Care | Payer: 59 | Attending: Obstetrics & Gynecology | Admitting: Obstetrics & Gynecology

## 2022-03-25 ENCOUNTER — Other Ambulatory Visit: Payer: Self-pay

## 2022-03-25 DIAGNOSIS — O9902 Anemia complicating childbirth: Secondary | ICD-10-CM | POA: Diagnosis present

## 2022-03-25 DIAGNOSIS — O99214 Obesity complicating childbirth: Secondary | ICD-10-CM | POA: Diagnosis present

## 2022-03-25 DIAGNOSIS — Z87891 Personal history of nicotine dependence: Secondary | ICD-10-CM

## 2022-03-25 DIAGNOSIS — Z3A39 39 weeks gestation of pregnancy: Secondary | ICD-10-CM

## 2022-03-25 DIAGNOSIS — Z98891 History of uterine scar from previous surgery: Secondary | ICD-10-CM

## 2022-03-25 DIAGNOSIS — Z3490 Encounter for supervision of normal pregnancy, unspecified, unspecified trimester: Secondary | ICD-10-CM

## 2022-03-25 DIAGNOSIS — O134 Gestational [pregnancy-induced] hypertension without significant proteinuria, complicating childbirth: Secondary | ICD-10-CM | POA: Diagnosis present

## 2022-03-25 DIAGNOSIS — O26893 Other specified pregnancy related conditions, third trimester: Secondary | ICD-10-CM | POA: Diagnosis present

## 2022-03-25 DIAGNOSIS — O99824 Streptococcus B carrier state complicating childbirth: Secondary | ICD-10-CM | POA: Diagnosis present

## 2022-03-25 DIAGNOSIS — O321XX0 Maternal care for breech presentation, not applicable or unspecified: Secondary | ICD-10-CM | POA: Diagnosis present

## 2022-03-26 ENCOUNTER — Encounter (HOSPITAL_COMMUNITY): Payer: Self-pay | Admitting: Obstetrics & Gynecology

## 2022-03-26 ENCOUNTER — Encounter (HOSPITAL_COMMUNITY)
Admission: AD | Disposition: A | Discharge status: Home / Self Care | Payer: Self-pay | Source: Home / Self Care | Attending: Obstetrics & Gynecology

## 2022-03-26 ENCOUNTER — Inpatient Hospital Stay (HOSPITAL_COMMUNITY): Payer: 59 | Admitting: Anesthesiology

## 2022-03-26 ENCOUNTER — Inpatient Hospital Stay (HOSPITAL_COMMUNITY): Payer: 59

## 2022-03-26 DIAGNOSIS — Z3490 Encounter for supervision of normal pregnancy, unspecified, unspecified trimester: Secondary | ICD-10-CM

## 2022-03-26 DIAGNOSIS — O321XX0 Maternal care for breech presentation, not applicable or unspecified: Secondary | ICD-10-CM

## 2022-03-26 DIAGNOSIS — Z3A39 39 weeks gestation of pregnancy: Secondary | ICD-10-CM

## 2022-03-26 DIAGNOSIS — Z98891 History of uterine scar from previous surgery: Secondary | ICD-10-CM

## 2022-03-26 LAB — TYPE AND SCREEN
ABO/RH(D): O POS
Antibody Screen: NEGATIVE

## 2022-03-26 LAB — CBC
HCT: 30.5 % — ABNORMAL LOW (ref 36.0–46.0)
Hemoglobin: 10.3 g/dL — ABNORMAL LOW (ref 12.0–15.0)
MCH: 26.9 pg (ref 26.0–34.0)
MCHC: 33.8 g/dL (ref 30.0–36.0)
MCV: 79.6 fL — ABNORMAL LOW (ref 80.0–100.0)
Platelets: 300 10*3/uL (ref 150–400)
RBC: 3.83 MIL/uL — ABNORMAL LOW (ref 3.87–5.11)
RDW: 14 % (ref 11.5–15.5)
WBC: 6.2 10*3/uL (ref 4.0–10.5)
nRBC: 0 % (ref 0.0–0.2)

## 2022-03-26 LAB — RPR: RPR Ser Ql: NONREACTIVE

## 2022-03-26 SURGERY — Surgical Case
Anesthesia: Spinal

## 2022-03-26 MED ORDER — COCONUT OIL OIL: 1.0000 | TOPICAL_OIL | Status: DC | PRN

## 2022-03-26 MED ORDER — DIBUCAINE (PERIANAL) 1 % EX OINT: 1.0000 | TOPICAL_OINTMENT | CUTANEOUS | Status: DC | PRN

## 2022-03-26 MED ORDER — NALOXONE HCL 4 MG/10ML IJ SOLN: 1.0000 ug/kg/h | INTRAVENOUS | Status: DC | PRN

## 2022-03-26 MED ORDER — ONDANSETRON HCL 4 MG/2ML IJ SOLN
INTRAMUSCULAR | Status: DC | PRN
  Administered 2022-03-26: 4 mg via INTRAVENOUS

## 2022-03-26 MED ORDER — PHENYLEPHRINE HCL (PRESSORS) 10 MG/ML IV SOLN
INTRAVENOUS | Status: DC | PRN
  Administered 2022-03-26: 80 ug via INTRAVENOUS
  Administered 2022-03-26 (×2): 160 ug via INTRAVENOUS
  Administered 2022-03-26: 80 ug via INTRAVENOUS
  Administered 2022-03-26: 160 ug via INTRAVENOUS

## 2022-03-26 MED ORDER — OXYTOCIN-SODIUM CHLORIDE 30-0.9 UT/500ML-% IV SOLN: 2.5000 [IU]/h | INTRAVENOUS | Status: DC

## 2022-03-26 MED ORDER — PHENYLEPHRINE HCL-NACL 20-0.9 MG/250ML-% IV SOLN
INTRAVENOUS | Status: AC
  Filled 2022-03-26: qty 250

## 2022-03-26 MED ORDER — LACTATED RINGERS IV SOLN: INTRAVENOUS | Status: DC

## 2022-03-26 MED ORDER — DEXAMETHASONE SODIUM PHOSPHATE 10 MG/ML IJ SOLN
INTRAMUSCULAR | Status: DC | PRN
  Administered 2022-03-26: 8 mg via INTRAVENOUS

## 2022-03-26 MED ORDER — BUPIVACAINE IN DEXTROSE 0.75-8.25 % IT SOLN
INTRATHECAL | Status: DC | PRN
  Administered 2022-03-26: 1.7 mL via INTRATHECAL

## 2022-03-26 MED ORDER — NALOXONE HCL 0.4 MG/ML IJ SOLN: 0.4000 mg | INTRAMUSCULAR | Status: DC | PRN

## 2022-03-26 MED ORDER — MORPHINE SULFATE (PF) 0.5 MG/ML IJ SOLN
INTRAMUSCULAR | Status: AC
  Filled 2022-03-26: qty 10

## 2022-03-26 MED ORDER — FENTANYL CITRATE (PF) 100 MCG/2ML IJ SOLN
50.0000 ug | INTRAMUSCULAR | Status: DC | PRN
  Administered 2022-03-26 (×2): 100 ug via INTRAVENOUS
  Administered 2022-03-26: 50 ug via INTRAVENOUS
  Filled 2022-03-26 (×3): qty 2

## 2022-03-26 MED ORDER — CEFAZOLIN IN SODIUM CHLORIDE 3-0.9 GM/100ML-% IV SOLN
INTRAVENOUS | Status: AC
  Filled 2022-03-26: qty 100

## 2022-03-26 MED ORDER — METOCLOPRAMIDE HCL 5 MG/ML IJ SOLN
INTRAMUSCULAR | Status: DC | PRN
  Administered 2022-03-26 (×2): 5 mg via INTRAVENOUS

## 2022-03-26 MED ORDER — SIMETHICONE 80 MG PO CHEW
80.0000 mg | CHEWABLE_TABLET | ORAL | Status: DC | PRN
  Administered 2022-03-28: 80 mg via ORAL

## 2022-03-26 MED ORDER — ACETAMINOPHEN 325 MG PO TABS: 650.0000 mg | ORAL_TABLET | ORAL | Status: DC | PRN

## 2022-03-26 MED ORDER — OXYCODONE-ACETAMINOPHEN 5-325 MG PO TABS: 2.0000 | ORAL_TABLET | ORAL | Status: DC | PRN

## 2022-03-26 MED ORDER — WITCH HAZEL-GLYCERIN EX PADS: 1.0000 | MEDICATED_PAD | CUTANEOUS | Status: DC | PRN

## 2022-03-26 MED ORDER — SOD CITRATE-CITRIC ACID 500-334 MG/5ML PO SOLN
30.0000 mL | ORAL | Status: DC | PRN
  Administered 2022-03-26: 30 mL via ORAL
  Filled 2022-03-26: qty 30

## 2022-03-26 MED ORDER — MORPHINE SULFATE (PF) 0.5 MG/ML IJ SOLN
INTRAMUSCULAR | Status: DC | PRN
  Administered 2022-03-26: 150 ug via INTRATHECAL

## 2022-03-26 MED ORDER — FENTANYL-BUPIVACAINE-NACL 0.5-0.125-0.9 MG/250ML-% EP SOLN: 12.0000 mL/h | EPIDURAL | Status: DC | PRN

## 2022-03-26 MED ORDER — PHENYLEPHRINE 80 MCG/ML (10ML) SYRINGE FOR IV PUSH (FOR BLOOD PRESSURE SUPPORT): 80.0000 ug | PREFILLED_SYRINGE | INTRAVENOUS | Status: DC | PRN

## 2022-03-26 MED ORDER — ONDANSETRON HCL 4 MG/2ML IJ SOLN
INTRAMUSCULAR | Status: AC
  Filled 2022-03-26: qty 2

## 2022-03-26 MED ORDER — ACETAMINOPHEN 10 MG/ML IV SOLN
INTRAVENOUS | Status: DC | PRN
  Administered 2022-03-26: 1000 mg via INTRAVENOUS

## 2022-03-26 MED ORDER — DEXAMETHASONE SODIUM PHOSPHATE 4 MG/ML IJ SOLN
INTRAMUSCULAR | Status: AC
  Filled 2022-03-26: qty 2

## 2022-03-26 MED ORDER — IBUPROFEN 600 MG PO TABS
600.0000 mg | ORAL_TABLET | Freq: Four times a day (QID) | ORAL | Status: DC
  Administered 2022-03-27 (×4): 600 mg via ORAL
  Filled 2022-03-26 (×4): qty 1

## 2022-03-26 MED ORDER — MISOPROSTOL 25 MCG QUARTER TABLET
25.0000 ug | ORAL_TABLET | ORAL | Status: DC | PRN
  Administered 2022-03-26 (×2): 25 ug via VAGINAL
  Filled 2022-03-26 (×2): qty 1

## 2022-03-26 MED ORDER — OXYCODONE-ACETAMINOPHEN 5-325 MG PO TABS: 1.0000 | ORAL_TABLET | ORAL | Status: DC | PRN

## 2022-03-26 MED ORDER — EPHEDRINE 5 MG/ML INJ: 10.0000 mg | INTRAVENOUS | Status: DC | PRN

## 2022-03-26 MED ORDER — STERILE WATER FOR IRRIGATION IR SOLN
Status: DC | PRN
  Administered 2022-03-26: 1000 mL

## 2022-03-26 MED ORDER — TETANUS-DIPHTH-ACELL PERTUSSIS 5-2.5-18.5 LF-MCG/0.5 IM SUSY: 0.5000 mL | PREFILLED_SYRINGE | Freq: Once | INTRAMUSCULAR | Status: DC

## 2022-03-26 MED ORDER — SENNOSIDES-DOCUSATE SODIUM 8.6-50 MG PO TABS
2.0000 | ORAL_TABLET | Freq: Every day | ORAL | Status: DC
  Administered 2022-03-27 – 2022-03-30 (×4): 2 via ORAL
  Filled 2022-03-26 (×4): qty 2

## 2022-03-26 MED ORDER — DIPHENHYDRAMINE HCL 50 MG/ML IJ SOLN: 12.5000 mg | INTRAMUSCULAR | Status: DC | PRN

## 2022-03-26 MED ORDER — FENTANYL CITRATE (PF) 100 MCG/2ML IJ SOLN
INTRAMUSCULAR | Status: AC
  Filled 2022-03-26: qty 2

## 2022-03-26 MED ORDER — FENTANYL CITRATE (PF) 100 MCG/2ML IJ SOLN
INTRAMUSCULAR | Status: DC | PRN
  Administered 2022-03-26: 15 ug via INTRATHECAL

## 2022-03-26 MED ORDER — OXYTOCIN-SODIUM CHLORIDE 30-0.9 UT/500ML-% IV SOLN
INTRAVENOUS | Status: DC | PRN
  Administered 2022-03-26: 300 mL via INTRAVENOUS

## 2022-03-26 MED ORDER — ACETAMINOPHEN 10 MG/ML IV SOLN
INTRAVENOUS | Status: AC
  Filled 2022-03-26: qty 100

## 2022-03-26 MED ORDER — OXYTOCIN BOLUS FROM INFUSION: 333.0000 mL | Freq: Once | INTRAVENOUS | Status: DC

## 2022-03-26 MED ORDER — DIPHENHYDRAMINE HCL 50 MG/ML IJ SOLN
12.5000 mg | INTRAMUSCULAR | Status: DC | PRN
  Administered 2022-03-26 – 2022-03-27 (×3): 12.5 mg via INTRAVENOUS
  Filled 2022-03-26 (×3): qty 1

## 2022-03-26 MED ORDER — SODIUM CHLORIDE 0.9 % IR SOLN
Status: DC | PRN
  Administered 2022-03-26: 1000 mL

## 2022-03-26 MED ORDER — EPHEDRINE SULFATE-NACL 50-0.9 MG/10ML-% IV SOSY
PREFILLED_SYRINGE | INTRAVENOUS | Status: DC | PRN
  Administered 2022-03-26 (×3): 5 mg via INTRAVENOUS

## 2022-03-26 MED ORDER — SODIUM CHLORIDE 0.9 % IV SOLN
5.0000 10*6.[IU] | Freq: Once | INTRAVENOUS | Status: AC
  Administered 2022-03-26: 5 10*6.[IU] via INTRAVENOUS
  Filled 2022-03-26: qty 5

## 2022-03-26 MED ORDER — DIPHENHYDRAMINE HCL 25 MG PO CAPS
25.0000 mg | ORAL_CAPSULE | Freq: Four times a day (QID) | ORAL | Status: DC | PRN
  Administered 2022-03-27: 25 mg via ORAL

## 2022-03-26 MED ORDER — DIPHENHYDRAMINE HCL 25 MG PO CAPS
25.0000 mg | ORAL_CAPSULE | ORAL | Status: DC | PRN
  Filled 2022-03-26: qty 1

## 2022-03-26 MED ORDER — KETOROLAC TROMETHAMINE 30 MG/ML IJ SOLN
INTRAMUSCULAR | Status: AC
  Filled 2022-03-26: qty 1

## 2022-03-26 MED ORDER — SODIUM CHLORIDE 0.9% FLUSH: 3.0000 mL | INTRAVENOUS | Status: DC | PRN

## 2022-03-26 MED ORDER — KETOROLAC TROMETHAMINE 30 MG/ML IJ SOLN
30.0000 mg | Freq: Four times a day (QID) | INTRAMUSCULAR | Status: AC | PRN
  Administered 2022-03-26 (×2): 30 mg via INTRAVENOUS
  Filled 2022-03-26: qty 1

## 2022-03-26 MED ORDER — ZOLPIDEM TARTRATE 5 MG PO TABS: 5.0000 mg | ORAL_TABLET | Freq: Every evening | ORAL | Status: DC | PRN

## 2022-03-26 MED ORDER — FENTANYL CITRATE (PF) 100 MCG/2ML IJ SOLN
25.0000 ug | INTRAMUSCULAR | Status: DC | PRN
  Administered 2022-03-26: 50 ug via INTRAVENOUS

## 2022-03-26 MED ORDER — MENTHOL 3 MG MT LOZG: 1.0000 | LOZENGE | OROMUCOSAL | Status: DC | PRN

## 2022-03-26 MED ORDER — OXYTOCIN-SODIUM CHLORIDE 30-0.9 UT/500ML-% IV SOLN
INTRAVENOUS | Status: AC
  Filled 2022-03-26: qty 500

## 2022-03-26 MED ORDER — KETOROLAC TROMETHAMINE 30 MG/ML IJ SOLN: 30.0000 mg | Freq: Four times a day (QID) | INTRAMUSCULAR | Status: AC | PRN

## 2022-03-26 MED ORDER — ONDANSETRON HCL 4 MG/2ML IJ SOLN: 4.0000 mg | Freq: Four times a day (QID) | INTRAMUSCULAR | Status: DC | PRN

## 2022-03-26 MED ORDER — PHENYLEPHRINE HCL-NACL 20-0.9 MG/250ML-% IV SOLN
INTRAVENOUS | Status: DC | PRN
  Administered 2022-03-26: 60 ug/min via INTRAVENOUS

## 2022-03-26 MED ORDER — MEPERIDINE HCL 25 MG/ML IJ SOLN: 6.2500 mg | INTRAMUSCULAR | Status: DC | PRN

## 2022-03-26 MED ORDER — PENICILLIN G POT IN DEXTROSE 60000 UNIT/ML IV SOLN
3.0000 10*6.[IU] | INTRAVENOUS | Status: DC
  Administered 2022-03-26 (×2): 3 10*6.[IU] via INTRAVENOUS
  Filled 2022-03-26 (×2): qty 50

## 2022-03-26 MED ORDER — TRANEXAMIC ACID-NACL 1000-0.7 MG/100ML-% IV SOLN
1000.0000 mg | Freq: Once | INTRAVENOUS | Status: AC
  Administered 2022-03-26: 1000 mg via INTRAVENOUS
  Filled 2022-03-26: qty 100

## 2022-03-26 MED ORDER — LIDOCAINE HCL (PF) 1 % IJ SOLN: 30.0000 mL | INTRAMUSCULAR | Status: DC | PRN

## 2022-03-26 MED ORDER — ACETAMINOPHEN 500 MG PO TABS
1000.0000 mg | ORAL_TABLET | Freq: Four times a day (QID) | ORAL | Status: DC
  Administered 2022-03-26 – 2022-03-30 (×14): 1000 mg via ORAL
  Filled 2022-03-26 (×15): qty 2

## 2022-03-26 MED ORDER — ONDANSETRON HCL 4 MG/2ML IJ SOLN: 4.0000 mg | Freq: Three times a day (TID) | INTRAMUSCULAR | Status: DC | PRN

## 2022-03-26 MED ORDER — LACTATED RINGERS IV SOLN: 500.0000 mL | INTRAVENOUS | Status: DC | PRN

## 2022-03-26 MED ORDER — PRENATAL MULTIVITAMIN CH
1.0000 | ORAL_TABLET | Freq: Every day | ORAL | Status: DC
  Administered 2022-03-27 – 2022-03-30 (×4): 1 via ORAL
  Filled 2022-03-26 (×4): qty 1

## 2022-03-26 MED ORDER — OXYCODONE HCL 5 MG PO TABS
5.0000 mg | ORAL_TABLET | ORAL | Status: DC | PRN
  Administered 2022-03-27: 10 mg via ORAL
  Administered 2022-03-27: 5 mg via ORAL
  Administered 2022-03-27: 10 mg via ORAL
  Administered 2022-03-27: 5 mg via ORAL
  Administered 2022-03-28 – 2022-03-30 (×8): 10 mg via ORAL
  Filled 2022-03-26 (×3): qty 2
  Filled 2022-03-26: qty 1
  Filled 2022-03-26 (×8): qty 2

## 2022-03-26 MED ORDER — SIMETHICONE 80 MG PO CHEW
80.0000 mg | CHEWABLE_TABLET | Freq: Three times a day (TID) | ORAL | Status: DC
  Administered 2022-03-27 – 2022-03-30 (×9): 80 mg via ORAL
  Filled 2022-03-26 (×10): qty 1

## 2022-03-26 MED ORDER — LACTATED RINGERS IV SOLN: 500.0000 mL | Freq: Once | INTRAVENOUS | Status: DC

## 2022-03-26 MED ORDER — TERBUTALINE SULFATE 1 MG/ML IJ SOLN: 0.2500 mg | Freq: Once | INTRAMUSCULAR | Status: DC | PRN

## 2022-03-26 MED ORDER — CEFAZOLIN IN SODIUM CHLORIDE 3-0.9 GM/100ML-% IV SOLN
3.0000 g | Freq: Once | INTRAVENOUS | Status: AC
  Administered 2022-03-26: 3 g via INTRAVENOUS

## 2022-03-26 MED ORDER — FERROUS SULFATE 325 (65 FE) MG PO TABS
325.0000 mg | ORAL_TABLET | Freq: Every day | ORAL | Status: DC
  Administered 2022-03-27 – 2022-03-30 (×4): 325 mg via ORAL
  Filled 2022-03-26 (×4): qty 1

## 2022-03-26 MED ORDER — OXYTOCIN-SODIUM CHLORIDE 30-0.9 UT/500ML-% IV SOLN: 2.5000 [IU]/h | INTRAVENOUS | Status: AC

## 2022-03-26 SURGICAL SUPPLY — 32 items
BENZOIN TINCTURE PRP APPL 2/3 (GAUZE/BANDAGES/DRESSINGS) ×1 IMPLANT
CHLORAPREP W/TINT 26ML (MISCELLANEOUS) ×2 IMPLANT
CLAMP UMBILICAL CORD (MISCELLANEOUS) ×1 IMPLANT
CLOTH BEACON ORANGE TIMEOUT ST (SAFETY) ×1 IMPLANT
DERMABOND ADVANCED .7 DNX12 (GAUZE/BANDAGES/DRESSINGS) IMPLANT
DRSG OPSITE POSTOP 4X10 (GAUZE/BANDAGES/DRESSINGS) ×1 IMPLANT
ELECT REM PT RETURN 9FT ADLT (ELECTROSURGICAL) ×1
ELECTRODE REM PT RTRN 9FT ADLT (ELECTROSURGICAL) ×1 IMPLANT
EXTRACTOR VACUUM KIWI (MISCELLANEOUS) IMPLANT
GAUZE SPONGE 4X4 12PLY STRL (GAUZE/BANDAGES/DRESSINGS) IMPLANT
GAUZE SPONGE 4X4 12PLY STRL LF (GAUZE/BANDAGES/DRESSINGS) IMPLANT
GLOVE BIO SURGEON STRL SZ 6 (GLOVE) ×1 IMPLANT
GLOVE BIOGEL PI IND STRL 6 (GLOVE) ×2 IMPLANT
GLOVE BIOGEL PI IND STRL 7.0 (GLOVE) ×1 IMPLANT
GOWN STRL REUS W/TWL LRG LVL3 (GOWN DISPOSABLE) ×2 IMPLANT
KIT ABG SYR 3ML LUER SLIP (SYRINGE) ×1 IMPLANT
NDL HYPO 25X5/8 SAFETYGLIDE (NEEDLE) ×1 IMPLANT
NEEDLE HYPO 25X5/8 SAFETYGLIDE (NEEDLE) ×1 IMPLANT
NS IRRIG 1000ML POUR BTL (IV SOLUTION) ×1 IMPLANT
PACK C SECTION WH (CUSTOM PROCEDURE TRAY) ×1 IMPLANT
PAD ABD 7.5X8 STRL (GAUZE/BANDAGES/DRESSINGS) IMPLANT
PAD OB MATERNITY 4.3X12.25 (PERSONAL CARE ITEMS) ×1 IMPLANT
STRIP CLOSURE SKIN 1/2X4 (GAUZE/BANDAGES/DRESSINGS) IMPLANT
SUT CHROMIC 0 CTX 36 (SUTURE) ×3 IMPLANT
SUT MON AB 2-0 CT1 27 (SUTURE) ×1 IMPLANT
SUT PDS AB 0 CT1 27 (SUTURE) IMPLANT
SUT PLAIN 0 NONE (SUTURE) IMPLANT
SUT VIC AB 0 CT1 36 (SUTURE) IMPLANT
SUT VIC AB 4-0 KS 27 (SUTURE) IMPLANT
TOWEL OR 17X24 6PK STRL BLUE (TOWEL DISPOSABLE) ×1 IMPLANT
TRAY FOLEY W/BAG SLVR 14FR LF (SET/KITS/TRAYS/PACK) IMPLANT
WATER STERILE IRR 1000ML POUR (IV SOLUTION) ×1 IMPLANT

## 2022-03-26 NOTE — Anesthesia Postprocedure Evaluation (Signed)
Anesthesia Post Note  Patient: Melanie Stout  Procedure(s) Performed: CESAREAN SECTION     Patient location during evaluation: PACU Anesthesia Type: Spinal Level of consciousness: oriented and awake and alert Pain management: pain level controlled Vital Signs Assessment: post-procedure vital signs reviewed and stable Respiratory status: spontaneous breathing, respiratory function stable and nonlabored ventilation Cardiovascular status: blood pressure returned to baseline and stable Postop Assessment: no headache, no backache, no apparent nausea or vomiting and spinal receding Anesthetic complications: no   No notable events documented.  Last Vitals:  Vitals:   03/26/22 1322 03/26/22 1323  BP: (!) 99/59   Pulse: 60 (!) 58  Resp: 12 10  Temp: (!) 36.4 C     Last Pain:  Vitals:   03/26/22 1322  TempSrc:   PainSc: 0-No pain   Pain Goal:    LLE Motor Response: No movement due to regional block (03/26/22 1322) LLE Sensation: Numbness (03/26/22 1322) RLE Motor Response: No movement due to regional block (03/26/22 1322) RLE Sensation: Numbness (03/26/22 1322)     Epidural/Spinal Function Cutaneous sensation: Vague (03/26/22 1322), Patient able to flex knees: No (03/26/22 1322), Patient able to lift hips off bed: No (03/26/22 1322), Back pain beyond tenderness at insertion site: No (03/26/22 1322), Progressively worsening motor and/or sensory loss: No (03/26/22 1322)  Simrah Chatham A.

## 2022-03-26 NOTE — Op Note (Addendum)
Melanie Stout PROCEDURE DATE: 03/26/2022  PREOPERATIVE DIAGNOSIS: Intrauterine pregnancy at  [redacted]w[redacted]d weeks gestation, breech presentation  POSTOPERATIVE DIAGNOSIS: The same  PROCEDURE:  Primary Low Transverse Cesarean Section  SURGEON:  Dr. Linda Hedges  INDICATIONS: Melanie Stout is a 36 y.o. [id number] at [redacted]w[redacted]d scheduled for cesarean section secondary to breech presentation.  The risks of cesarean section discussed with the patient included but were not limited to: bleeding which may require transfusion or reoperation; infection which may require antibiotics; injury to bowel, bladder, ureters or other surrounding organs; injury to the fetus; need for additional procedures including hysterectomy in the event of a life-threatening hemorrhage; placental abnormalities wth subsequent pregnancies, incisional problems, thromboembolic phenomenon and other postoperative/anesthesia complications. The patient concurred with the proposed plan, giving informed written consent for the procedure.    FINDINGS:  Viable female infant in complete breech presentation, APGARs 8,9: weight pending  Large volume and clear amniotic fluid.  Intact placenta, three vessel cord.  Grossly normal uterus, ovaries and fallopian tubes. .   ANESTHESIA:  Spinal ESTIMATED BLOOD LOSS: 960 ml SPECIMENS: Placenta sent to L&D COMPLICATIONS: None immediate  PROCEDURE IN DETAIL:  The patient received intravenous antibiotics and had sequential compression devices applied to her lower extremities while in the preoperative area.  She was then taken to the operating room where spinal anesthesia was administered and was found to be adequate. She was then placed in a dorsal supine position with a leftward tilt. A foley catheter was placed into her bladder and attached to constant gravity. Traxi panus retractor was placed.  Abdomen was prepped and draped in a sterile manner.  After an adequate timeout was performed, a  Pfannenstiel skin incision was made with scalpel and carried through to the underlying layer of fascia. The fascia was incised in the midline and this incision was extended bilaterally using the Mayo scissors. Kocher clamps were applied to the superior aspect of the fascial incision and the underlying rectus muscles were dissected off bluntly. A similar process was carried out on the inferior aspect of the facial incision. The rectus muscles were separated in the midline bluntly and the peritoneum was entered bluntly. Bladder flap was created sharply and developed bluntly.  Prominent veins noted on anterior uterus.  A transverse hysterotomy was made with a scalpel and extended bilaterally bluntly. The bladder blade was then removed. The infant was successfully delivered using typical breech maneuvers, and cord was clamped and cut and infant was handed over to awaiting neonatology team. Uterine massage was then administered and the placenta delivered intact with three-vessel cord. The uterus was cleared of clot and debris.  The hysterotomy was closed with 0 chromic.  A second imbricating suture of 0-chromic was used to reinforce the incision and aid in hemostasis.  The peritoneum and rectus muscles were noted to be hemostatic and were reapproximated using 2-0 monocryl in a running fashion.  The fascia was closed with 0-Vicryl in a running fashion with good restoration of anatomy.  The subcutaneus tissue was copiously irrigated.  The skin was closed with 4-0 vicryl in a subcuticular fashion.  Pt tolerated the procedure will.  All counts were correct x2.  Pt went to the recovery room in stable condition.

## 2022-03-26 NOTE — H&P (Signed)
Melanie Stout is a 36 y.o. female [phone] at [redacted]w[redacted]d presenting for elective IOL.  Patient received VMP#2 at [facility].  Feeling mild cramping.  Active FM.  Patient has h/o GHTN in first pregnancy; normotensive this pregnancy and taking ASA.  Patient also has h/o HS and previously took Humira; discontinued during pregnancy.  Patient has h/o PPH following first delivery which did require blood transfusion.  Patient has h/o HSV and has taken valtrex ppx since 36 weeks.  GBS positive.  OB History     Gravida  4   Para  2   Term  2   Preterm  0   AB  1   Living  2      SAB  1   IAB  0   Ectopic  0   Multiple  0   Live Births  2          Past Medical History:  Diagnosis Date   Abnormal Pap smear    Blood transfusion without reported diagnosis 01/05/2013   Headache(784.0)    Hx of varicella    Hyperlipidemia    Past Surgical History:  Procedure Laterality Date   NOSE SURGERY     Family History: family history includes Diabetes in her maternal aunt; Hypertension in her mother; Parkinson's disease in her father. Social History:  reports that she quit smoking about 5 years ago. Her smoking use included cigarettes. She smoked an average of .5 packs per day. She has never used smokeless tobacco. She reports that she does not currently use alcohol. She reports that she does not use drugs.     Maternal Diabetes: No Genetic Screening: Normal Maternal Ultrasounds/Referrals: Normal Fetal Ultrasounds or other Referrals:  None Maternal Substance Abuse:  No Significant Maternal Medications:  None Significant Maternal Lab Results:  Group B Strep positive Number of Prenatal Visits:greater than 3 verified prenatal visits Other Comments:  None  Review of Systems Maternal Medical History:  Fetal activity: Perceived fetal activity is normal.   Last perceived fetal movement was within the past hour.   Prenatal complications: no prenatal complications Prenatal Complications  - Diabetes: none.   Dilation: Fingertip Effacement (%): 40 Station: Ballotable Exam by:: [employer] Blood pressure 135/76, pulse 72, temperature 98.2 F (36.8 C), temperature source Oral, resp. rate 18, height 5\' 6"  (1.676 m), weight 125.3 kg, unknown if currently breastfeeding. Maternal Exam:  Uterine Assessment: Contraction strength is mild.  Contraction frequency is irregular.  Abdomen: Patient reports no abdominal tenderness. Fundal height is c/w dates.   Estimated fetal weight is 8#.   Fetal presentation: breech   Fetal Exam Fetal Monitor Review: Baseline rate: 130.  Variability: moderate (6-25 bpm).   Pattern: accelerations present and no decelerations.   Fetal State Assessment: Category I - tracings are normal.   Physical Exam Constitutional:      Appearance: Normal appearance.  HENT:     Head: Normocephalic and atraumatic.  Pulmonary:     Effort: Pulmonary effort is normal.  Abdominal:     Palpations: Abdomen is soft.  Musculoskeletal:        General: Normal range of motion.     Cervical back: Normal range of motion.  Skin:    General: Skin is warm and dry.  Neurological:     Mental Status: She is alert and oriented to person, place, and time.  Psychiatric:        Mood and Affect: Mood normal.        Behavior: Behavior  normal.     Prenatal labs: ABO, Rh: --/--/O POS (10/24 0040) Antibody: NEG (10/24 0040) Rubella: Immune (03/21 0000) RPR: NON REACTIVE (10/24 0043)  HBsAg: Negative (03/21 0000)  HIV: Non-reactive (03/21 0000)  GBS: Positive/-- (10/03 0000)   Assessment/Plan: 28JM [id number] at [redacted]w[redacted]d with elective IOL; found to be breech. -Patient is offered ECV attempt vs primary C/S.  She is given pros and cons for each and is currently undecided.  She will consider and discuss with her partner. -PCN for GBS ppx running   Mitchel Honour 03/26/2022, 9:40 AM

## 2022-03-26 NOTE — Anesthesia Preprocedure Evaluation (Addendum)
Anesthesia Evaluation    Airway Mallampati: III  TM Distance: >3 FB Neck ROM: Full    Dental no notable dental hx. (+) Teeth Intact, Dental Advisory Given   Pulmonary former smoker,    Pulmonary exam normal breath sounds clear to auscultation       Cardiovascular negative cardio ROS Normal cardiovascular exam Rhythm:Regular Rate:Normal     Neuro/Psych  Headaches, negative psych ROS   GI/Hepatic Neg liver ROS, GERD  ,  Endo/Other  Morbid obesity  Renal/GU negative Renal ROS  negative genitourinary   Musculoskeletal negative musculoskeletal ROS (+)   Abdominal (+) + obese,   Peds  Hematology  (+) Blood dyscrasia, anemia ,   Anesthesia Other Findings   Reproductive/Obstetrics (+) Pregnancy AMA  Breech presentation                             Anesthesia Physical Anesthesia Plan  ASA: 3  Anesthesia Plan: Spinal   Post-op Pain Management: Minimal or no pain anticipated   Induction: Intravenous  PONV Risk Score and Plan: 4 or greater and Treatment may vary due to age or medical condition and Scopolamine patch - Pre-op  Airway Management Planned: Natural Airway  Additional Equipment: Fetal Monitoring  Intra-op Plan:   Post-operative Plan: Extubation in OR  Informed Consent: I have reviewed the patients History and Physical, chart, labs and discussed the procedure including the risks, benefits and alternatives for the proposed anesthesia with the patient or authorized representative who has indicated his/her understanding and acceptance.     Dental advisory given  Plan Discussed with: CRNA and Anesthesiologist  Anesthesia Plan Comments:         Anesthesia Quick Evaluation

## 2022-03-26 NOTE — Progress Notes (Addendum)
Floor RN called to inform me that since patient arrived from PACU about 2 hours ago, patient has passed some clots with fundal rub.   On my arrival, fundal rub produced approximated 15-20 cc clot; no bleeding otherwise and firm fundus.   Will give TXA to ensure continued hemostasis given starting anemia and above average blood loss with surgery.      03/26/2022    3:28 PM 03/26/2022    3:18 PM 03/26/2022    3:15 PM  Vitals with BMI  Systolic 448  185  Diastolic 75  73  Pulse 65 64 61   UOP clear and adequate at 100 cc/hr  Additionally, Peds NP was present on my arrival to the room and requested exam for active HSV lesions.  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  Office exam on 9/26 showed no lesions and patient was continued on Valtrex prophylaxis.  She denies prodromal symptoms and lesions.  Exam in patient's room entirely benign; no visible lesions using 4x4 swabs to ensure good exam and flash light for visibility.    Linda Hedges, DO

## 2022-03-26 NOTE — Progress Notes (Signed)
MD at bedside for cumulative EBL of 1425cc. Pt on assessments continues to pass medium sized clots with bright red lochia heavier than normal amts. Triton at bedside weighing pads and bedpads. Pt firm with min bleeding at last fundal massage with Dr. Lynnette Caffey present. Order for TXA IV received. MD discussed with Pt the possibility of blood transfusion in the am after CBC drawn.Will continue to monitor Pts lochia

## 2022-03-26 NOTE — Anesthesia Procedure Notes (Signed)
Spinal  Patient location during procedure: OR Start time: 03/26/2022 12:01 PM End time: 03/26/2022 12:06 PM Reason for block: surgical anesthesia Staffing Performed: anesthesiologist  Anesthesiologist: Josephine Igo, MD Performed by: Josephine Igo, MD Authorized by: Josephine Igo, MD   Preanesthetic Checklist Completed: patient identified, IV checked, site marked, risks and benefits discussed, surgical consent, monitors and equipment checked, pre-op evaluation and timeout performed Spinal Block Patient position: sitting Prep: DuraPrep and site prepped and draped Patient monitoring: heart rate, cardiac monitor, continuous pulse ox and blood pressure Approach: midline Location: L3-4 Injection technique: single-shot Needle Needle type: Pencan  Needle gauge: 24 G Needle length: 9 cm Needle insertion depth: 8 cm Assessment Sensory level: T4 Events: CSF return Additional Notes Patient tolerated procedure well. Adequate sensory level.

## 2022-03-26 NOTE — Transfer of Care (Addendum)
Immediate Anesthesia Transfer of Care Note  Patient: Melanie Stout  Procedure(s) Performed: CESAREAN SECTION  Patient Location: PACU  Anesthesia Type:Spinal  Level of Consciousness: awake, alert  and oriented  Airway & Oxygen Therapy: Patient Spontanous Breathing  Post-op Assessment: Report given to RN and Post -op Vital signs reviewed and stable  Post vital signs: Reviewed and stable  Last Vitals:  Vitals Value Taken Time  BP 99/59 03/26/22 1322  Temp 36.4 C 03/26/22 1322  Pulse 58 03/26/22 1327  Resp 33 03/26/22 1327  SpO2 100 % 03/26/22 1327  Vitals shown include unvalidated device data.  Last Pain:  Vitals:   03/26/22 1322  TempSrc:   PainSc: 0-No pain     Sensory level to sharp on arrival to PACU T-4.     Complications: No notable events documented.

## 2022-03-26 NOTE — Progress Notes (Signed)
Patient elects to proceed with primary C/S for breech presentation.  She declines ECV.  Patient is counseled re: risk of bleeding, infection, scarring and damage to surrounding structures.  She is informed of risk of uterine rupture and abnormal placentation in future pregnancies.  All questions were answered and patient wishes to proceed.  Linda Hedges, DO

## 2022-03-27 LAB — CBC
HCT: 23.8 % — ABNORMAL LOW (ref 36.0–46.0)
Hemoglobin: 8.2 g/dL — ABNORMAL LOW (ref 12.0–15.0)
MCH: 27.8 pg (ref 26.0–34.0)
MCHC: 34.5 g/dL (ref 30.0–36.0)
MCV: 80.7 fL (ref 80.0–100.0)
Platelets: 220 10*3/uL (ref 150–400)
RBC: 2.95 MIL/uL — ABNORMAL LOW (ref 3.87–5.11)
RDW: 13.9 % (ref 11.5–15.5)
WBC: 14.6 10*3/uL — ABNORMAL HIGH (ref 4.0–10.5)
nRBC: 0 % (ref 0.0–0.2)

## 2022-03-27 MED ORDER — HYDROMORPHONE HCL 1 MG/ML IJ SOLN: 1.0000 mg | INTRAMUSCULAR | Status: DC | PRN

## 2022-03-27 NOTE — Progress Notes (Signed)
Notified Dr. Royston Sinner about Borderline BP's.... 130's/80's.  Also, patient has been c/o Severe pain all day long, 7-9/10 every time we assess pain, even after giving pain meds.  Ice/Heat have also been used for both neck/shoulder and incisional pain.  Pt is in the shower at the moment and is removing Pressure Dsg.  Ambulation has been encouraged but with pain, has not been able to walk in halls.    Verbal order for prn pain med (Dilaudid IV) entered in epic.  Also, RN to notify on call MD for BP greater than 150/100.   Will continue to monitor.

## 2022-03-27 NOTE — Progress Notes (Signed)
In to assess patient after RN called.  She is overall doing well. Dressing came off in the shower and RN to replace. She reports pain as "achy" and feels like she has "gas trapped".  She is A&O and NAD, smiling, conversive, laughing.  Her abdomen is soft, non distended, +bs, and incision is clean and dry outside of some scant drainage with fundal pressure.  While patient reports a high pain level, her exam is benign and reassuring.  I encouraged ambulation. Will continue to monitor at this time.  Will give Dilaudid prn for pain and schedule ibuprofen and tylenol. Continue oxycodone PO prn pain as well.    Arty Baumgartner MD

## 2022-03-27 NOTE — Progress Notes (Signed)
Subjective: Postpartum Day 1: Cesarean Delivery breech presentation Patient reports tolerating PO and no problems voiding.    Objective: Vital signs in last 24 hours: Temp:  [96.5 F (35.8 C)-98.6 F (37 C)] 97.9 F (36.6 C) (10/25 0754) Pulse Rate:  [56-70] 70 (10/25 0754) Resp:  [0-18] 18 (10/25 0754) BP: (82-151)/(42-88) 137/82 (10/25 0754) SpO2:  [98 %-100 %] 100 % (10/25 0754)  Physical Exam:  General: alert and cooperative Lochia: appropriate Uterine Fundus: firm Incision: healing well DVT Evaluation: No evidence of DVT seen on physical exam.  Recent Labs    03/26/22 0043 03/27/22 0622  HGB 10.3* 8.2*  HCT 30.5* 23.8*    Assessment/Plan: Status post Cesarean section. Doing well postoperatively.  Continue current care.   Tyson Dense, MD 03/27/2022, 8:45 AM

## 2022-03-27 NOTE — Social Work (Signed)
CSW received consult for hx of Anxiety and Postpartum Depression.  CSW met with MOB to offer support and complete assessment. CSW observed MOB resting in bed and FOB near by, MOB gave verbal permission for CSW to speak while FOB was present. CSW introduced self, CSW role and reason for visit. MOB was agreeable to visit. CSW inquired about how MOB was feeling, MOB reported she was in a lot of pain but they are working on managing it. MOB reported her delivery did not go as expected, MOB reported she had to have a C Section and initially she was upset about it but now that she and baby are both here and health she is content. CSW congratulated MOB on baby "Melanie Stout".  CSW inquired about MOB MH hx, MOB reported she was diagnosed "years ago". CSW inquired abut treatment, MOB reported she was taking Prozac until she found out she was pregnant. MOB reported she plans to restart medication now that she has delivered. MOB reported she was in therapy in the past but none currently. CSW assessed for safety, MOB denied any SI or HI. MOB identified FOB and her mom as her supports. CSW provided education regarding the baby blues period vs. perinatal mood disorders, discussed treatment and gave resources for mental health follow up if concerns arise.  CSW recommends self-evaluation during the postpartum time period using the New Mom Checklist from Postpartum Progress and encouraged MOB to contact a medical professional if symptoms are noted at any time.    CSW provided review of Sudden Infant Death Syndrome (SIDS) precautions. MOB identified [facility] for infants follow up, MOB reported they have all necessary items for the infant including a bassinet for her to sleep.  CSW identifies no further need for intervention and no barriers to discharge at this time.   Tanaysha Alkins, LCSWA Clinical Social Worker [phone]  

## 2022-03-28 MED ORDER — IBUPROFEN 600 MG PO TABS
600.0000 mg | ORAL_TABLET | Freq: Four times a day (QID) | ORAL | Status: DC
  Administered 2022-03-28 – 2022-03-30 (×10): 600 mg via ORAL
  Filled 2022-03-28 (×10): qty 1

## 2022-03-28 MED ORDER — BISACODYL 10 MG RE SUPP: 10.0000 mg | Freq: Every day | RECTAL | Status: DC | PRN

## 2022-03-28 MED ORDER — POLYETHYLENE GLYCOL 3350 17 G PO PACK
17.0000 g | PACK | Freq: Every day | ORAL | Status: DC | PRN
  Administered 2022-03-29: 17 g via ORAL
  Filled 2022-03-28 (×2): qty 1

## 2022-03-28 NOTE — Progress Notes (Signed)
Subjective: Postpartum Day 2: Cesarean Delivery Patient reports incisional pain, tolerating PO, and no problems voiding.    Objective: Vital signs in last 24 hours: Temp:  [97.5 F (36.4 C)-98.6 F (37 C)] 97.5 F (36.4 C) (10/26 0520) Pulse Rate:  [68-89] 83 (10/26 0917) Resp:  [16-18] 16 (10/26 0520) BP: (125-151)/(77-89) 151/83 (10/26 0917) SpO2:  [99 %-100 %] 99 % (10/25 1332)  Physical Exam:  General: alert, cooperative, appears stated age, and mild distress Lochia: appropriate Uterine Fundus: firm Incision: healing well DVT Evaluation: No evidence of DVT seen on physical exam.  Recent Labs    03/26/22 0043 03/27/22 0622  HGB 10.3* 8.2*  HCT 30.5* 23.8*    Assessment/Plan: Status post Cesarean section. Doing well postoperatively. Pain management is a concern for her.  She hasnt passed flatus yet but has active bowel sounds   Will offer suppository and or miralax Discussed NSAIDs, tylenol and oxy prn Normal exam Continue current care.  Luz Lex, MD 03/28/2022, 9:50 AM

## 2022-03-28 NOTE — Progress Notes (Signed)
Called to room by patient's partner reporting "gush of blood" from incision site after tape dressing and honeycomb came off in shower. RN entered room to assess and observed an approximately 1cm area of skin at incision site which was not approximated. Gush of sangeinous drainage observed. Pressure was applied with assistance of NT, while RN called Dr. Philis Pique. RN communicated assessment to Dr. Philis Pique, with request for MD to assess at bedside. Orders received to apply gauze dressing with pressure and observe and call back if "dressing saturated." RN counseled family to call with any concerns and updated them that RN would continue to observe and assess closely.

## 2022-03-29 LAB — COMPREHENSIVE METABOLIC PANEL
ALT: 10 U/L (ref 0–44)
AST: 15 U/L (ref 15–41)
Albumin: 2.2 g/dL — ABNORMAL LOW (ref 3.5–5.0)
Alkaline Phosphatase: 72 U/L (ref 38–126)
Anion gap: 9 (ref 5–15)
BUN: 6 mg/dL (ref 6–20)
CO2: 24 mmol/L (ref 22–32)
Calcium: 8.4 mg/dL — ABNORMAL LOW (ref 8.9–10.3)
Chloride: 105 mmol/L (ref 98–111)
Creatinine, Ser: 0.49 mg/dL (ref 0.44–1.00)
GFR, Estimated: >60 mL/min (ref >60)
Glucose, Bld: 80 mg/dL (ref 70–99)
Potassium: 3.5 mmol/L (ref 3.5–5.1)
Sodium: 138 mmol/L (ref 135–145)
Total Bilirubin: 0.4 mg/dL (ref 0.3–1.2)
Total Protein: 4.9 g/dL — ABNORMAL LOW (ref 6.5–8.1)

## 2022-03-29 MED ORDER — GABAPENTIN 300 MG PO CAPS
300.0000 mg | ORAL_CAPSULE | Freq: Every day | ORAL | Status: DC
  Administered 2022-03-29: 300 mg via ORAL
  Filled 2022-03-29: qty 1

## 2022-03-29 MED ORDER — LABETALOL HCL 200 MG PO TABS
200.0000 mg | ORAL_TABLET | Freq: Three times a day (TID) | ORAL | Status: DC
  Administered 2022-03-29 – 2022-03-30 (×4): 200 mg via ORAL
  Filled 2022-03-29 (×4): qty 1

## 2022-03-29 NOTE — Progress Notes (Signed)
Called and spoke to DR. Chen to clarify from the note about the dressing removal.  Dr. Bridgett Larsson stated to remove the dressing around 4:00pm and to place some steri strips and a honeycomb dressing over the incision. Will notify Dr. Bridgett Larsson if incision is still bleeding.

## 2022-03-29 NOTE — Progress Notes (Addendum)
Subjective: Postpartum Day 3: Cesarean Delivery Patient reports continued incisional pain. Tolerating good PO intake and walking normally. Had a small amount of serosanguinous drainage today. Pressure dressing reapplied. Will plan to remove later today.  Objective: Vital signs in last 24 hours: Temp:  [97.8 F (36.6 C)-98.3 F (36.8 C)] 97.9 F (36.6 C) (10/27 0550) Pulse Rate:  [83-88] 83 (10/27 0550) Resp:  [14-18] 14 (10/27 0550) BP: (137-151)/(76-85) 137/76 (10/27 0550) SpO2:  [100 %] 100 % (10/27 0550)  Physical Exam:  General: Alert, no distress Lochia: appropriate Uterine Fundus: firm Incision: Pressure dressing in place, not saturated DVT Evaluation: No evidence of DVT seen on physical exam.  Recent Labs    03/27/22 0622  HGB 8.2*  HCT 23.8*    Assessment/Plan: Status post Cesarean section.  Incisional pain - add on gabapentin 300mg  qHS gHTN - will start labetalol 200mg  TID today and watch Bps through tomorrow. Will check CMP. Plts wnl. Anemia - asx, on PO iron Continue NSAIDs, tylenol and oxy prn Normal exam Anticipate likely discharge tomorrow  Charlotta Newton, MD 03/29/2022, 8:58 AM

## 2022-03-30 MED ORDER — FERROUS SULFATE 325 (65 FE) MG PO TABS: 325.0000 mg | ORAL_TABLET | Freq: Every day | ORAL | 3 refills | Status: DC

## 2022-03-30 MED ORDER — ACETAMINOPHEN 500 MG PO TABS: 1000.0000 mg | ORAL_TABLET | Freq: Four times a day (QID) | ORAL | 0 refills | Status: DC

## 2022-03-30 MED ORDER — GABAPENTIN 300 MG PO CAPS: 300.0000 mg | ORAL_CAPSULE | Freq: Every day | ORAL | 0 refills | Status: DC

## 2022-03-30 MED ORDER — IBUPROFEN 600 MG PO TABS: 600.0000 mg | ORAL_TABLET | Freq: Four times a day (QID) | ORAL | 0 refills | Status: DC

## 2022-03-30 MED ORDER — POLYETHYLENE GLYCOL 3350 17 G PO PACK: 17.0000 g | PACK | Freq: Every day | ORAL | 0 refills | Status: DC | PRN

## 2022-03-30 MED ORDER — LABETALOL HCL 200 MG PO TABS: 200.0000 mg | ORAL_TABLET | Freq: Three times a day (TID) | ORAL | 0 refills | Status: DC

## 2022-03-30 MED ORDER — OXYCODONE HCL 5 MG PO TABS: 5.0000 mg | ORAL_TABLET | ORAL | 0 refills | Status: DC | PRN

## 2022-03-30 NOTE — Discharge Summary (Signed)
Postpartum Discharge Summary  Date of Service updated 03/30/2022     Patient Name: Melanie Stout DOB: 1986-01-17 MRN: [id number]  Date of admission: 03/25/2022 Delivery date:03/26/2022  Delivering provider: Linda Hedges  Date of discharge: 03/30/2022  Admitting diagnosis: Pregnancy [Z34.90] S/P cesarean section [Z98.891] Intrauterine pregnancy: [redacted]w[redacted]d    Secondary diagnosis:  Principal Problem:   Pregnancy Active Problems:   S/P cesarean section  Additional problems: gHTN, obesity, history of HS, history of HSV    Discharge diagnosis: Term Pregnancy Delivered and Gestational Hypertension                                              Post partum procedures: none Augmentation: N/A Complications: None  Hospital course: Sceduled C/S   36y.o. yo [id number] admitted to the hospital 03/25/2022 for IOL, however found to be breech. Proceeded with cesarean section with the following indication:Malpresentation.Delivery details are as follows:  Membrane Rupture Time/Date: 12:30 PM ,03/26/2022   Delivery Method:C-Section, Low Transverse  Details of operation can be found in separate operative note.  Patient had a normal postpartum course.  She had elevated blood pressures and was started on labetalol 2007mID with improved control. She is ambulating, tolerating a regular diet, passing flatus, and urinating well. Patient is discharged home in stable condition on  03/30/22        Newborn Data: Birth date:03/26/2022  Birth time:12:31 PM  Gender:Female  Living status:Living  Apgars:8 ,9  Weight:3610 g     Magnesium Sulfate received: No BMZ received: No Rhophylac:No MMR:No T-DaP:Given prenatally Flu: No Transfusion:No  Physical exam  Vitals:   03/28/22 2103 03/29/22 0550 03/29/22 2108 03/30/22 0509  BP: (!) 140/84 137/76 125/80 (!) 112/58  Pulse: 86 83 83 80  Resp: _0 Temp: 97.8 F (36.6 C) 97.9 F (36.6 C) 98.1 F (36.7 C) 97.9 F (36.6  C)  TempSrc: Axillary Axillary Oral Oral  SpO2: 100% 100% 100%   Weight:      Height:       General: alert, cooperative, and no distress Lochia: appropriate Uterine Fundus: firm Incision: Healing well with no significant drainage DVT Evaluation: No evidence of DVT seen on physical exam. Labs: Lab Results  Component Value Date   WBC 14.6 (H) 03/27/2022   HGB 8.2 (L) 03/27/2022   HCT 23.8 (L) 03/27/2022   MCV 80.7 03/27/2022   PLT 220 03/27/2022      Latest Ref Rng & Units 03/29/2022    9:06 AM  CMP  Glucose 70 - 99 mg/dL 80   BUN 6 - 20 mg/dL 6   Creatinine 0.44 - 1.00 mg/dL 0.49   Sodium 135 - 145 mmol/L 138   Potassium 3.5 - 5.1 mmol/L 3.5   Chloride 98 - 111 mmol/L 105   CO2 22 - 32 mmol/L 24   Calcium 8.9 - 10.3 mg/dL 8.4   Total Protein 6.5 - 8.1 g/dL 4.9   Total Bilirubin 0.3 - 1.2 mg/dL 0.4   Alkaline Phos 38 - 126 U/L 72   AST 15 - 41 U/L 15   ALT 0 - 44 U/L 10    Edinburgh Score:    03/30/2022    8:43 AM  Edinburgh Postnatal Depression Scale Screening Tool  I have been able to laugh and see the funny side of  things. 0  I have looked forward with enjoyment to things. 1  I have blamed myself unnecessarily when things went wrong. 2  I have been anxious or worried for no good reason. 2  I have felt scared or panicky for no good reason. 0  Things have been getting on top of me. 1  I have been so unhappy that I have had difficulty sleeping. 0  I have felt sad or miserable. 0  I have been so unhappy that I have been crying. 1  The thought of harming myself has occurred to me. 0  Edinburgh Postnatal Depression Scale Total 7      After visit meds:  Allergies as of 03/30/2022   No Known Allergies      Medication List     STOP taking these medications    FLUoxetine 20 MG tablet Commonly known as: PROZAC   valACYclovir 1000 MG tablet Commonly known as: VALTREX       TAKE these medications    acetaminophen 500 MG tablet Commonly known as:  TYLENOL Take 500 mg by mouth every 6 (six) hours as needed. What changed: Another medication with the same name was added. Make sure you understand how and when to take each.   acetaminophen 500 MG tablet Commonly known as: TYLENOL Take 2 tablets (1,000 mg total) by mouth every 6 (six) hours. What changed: You were already taking a medication with the same name, and this prescription was added. Make sure you understand how and when to take each.   ferrous sulfate 325 (65 FE) MG tablet Take 1 tablet (325 mg total) by mouth daily with breakfast. Start taking on: March 31, 2022   gabapentin 300 MG capsule Commonly known as: NEURONTIN Take 1 capsule (300 mg total) by mouth at bedtime.   Humira Pen 80 MG/0.8ML Pnkt Generic drug: Adalimumab Inject 80 mg into the skin every 14 (fourteen) days.   ibuprofen 600 MG tablet Commonly known as: ADVIL Take 1 tablet (600 mg total) by mouth every 6 (six) hours. Start taking on: March 31, 2022   labetalol 200 MG tablet Commonly known as: NORMODYNE Take 1 tablet (200 mg total) by mouth 3 (three) times daily.   oxyCODONE 5 MG immediate release tablet Commonly known as: Oxy IR/ROXICODONE Take 1 tablet (5 mg total) by mouth every 4 (four) hours as needed for moderate pain.   pantoprazole 40 MG tablet Commonly known as: PROTONIX Take 1 tablet (40 mg total) by mouth daily.   polyethylene glycol 17 g packet Commonly known as: MIRALAX / GLYCOLAX Take 17 g by mouth daily as needed for severe constipation or moderate constipation.   sucralfate 1 g tablet Commonly known as: Carafate Take 1 tablet (1 g total) by mouth 4 (four) times daily -  with meals and at bedtime.   Vitamin D3 1.25 MG (50000 UT) Caps Take 1 weekly for 12 weeks What changed:  how much to take how to take this when to take this additional instructions         Discharge home in stable condition Infant Feeding: Bottle and Breast Infant Disposition:home with  mother Discharge instruction: per After Visit Summary and Postpartum booklet. Activity: Advance as tolerated. Pelvic rest for 6 weeks.  Diet: routine diet Anticipated Birth Control: Unsure Postpartum Appointment:6 weeks Additional Postpartum F/U: Incision check 1 week and BP check 1 week   03/30/2022 Charlotta Newton, MD

## 2022-04-05 ENCOUNTER — Telehealth (HOSPITAL_COMMUNITY): Payer: Self-pay | Admitting: *Deleted

## 2022-04-05 NOTE — Telephone Encounter (Signed)
Mom reports feeling okay. Incision not healing well on one side per mom. OB appt this morning. Will continue to watch incision for changes. No concerns regarding herself at this time. EPDS=9 (hospital score=7) Mom reports baby is well. Feeding, peeing, and pooping without difficulty. Reviewed safe sleep. Mom has no concerns about baby at present.  Odis Hollingshead, RN 04-05-2022 at 12:02pm

## 2022-07-08 ENCOUNTER — Telehealth: Payer: Self-pay | Admitting: Family Medicine

## 2022-07-08 NOTE — Telephone Encounter (Signed)
Patient called to advise she has been experiencing dizziness/lightheadedness off and on. She is unsure of how long it's been going on but reports that last night it was "really crazy and she had to go lay down." She does not think that she has had headaches but she does think that she has been nauseous. Transferred to triage

## 2022-07-08 NOTE — Telephone Encounter (Signed)
Appt scheduled 07/11/22

## 2022-07-08 NOTE — Telephone Encounter (Signed)
Nurse Assessment Nurse: D'Heur Lucia Gaskins, RN, Adrienne Date/Time (Eastern Time): 07/08/2022 9:01:48 AM Confirm and document reason for call. If symptomatic, describe symptoms. ---Caller states she has been having dizzy spells (spinning sensation w/ nausea) and light headedness. Symptoms have been going on for a week or two. No fever. Does the patient have any new or worsening symptoms? ---Yes Will a triage be completed? ---Yes Related visit to physician within the last 2 weeks? ---No Does the PT have any chronic conditions? (i.e. diabetes, asthma, this includes High risk factors for pregnancy, etc.) ---No Is the patient pregnant or possibly pregnant? (Ask all females between the ages of 66-55) ---No Is this a behavioral health or substance abuse call? ---No Guidelines Guideline Title Affirmed Question Affirmed Notes Nurse Date/Time (Eastern Time) Dizziness - Vertigo [1] MILD dizziness (e.g., vertigo; walking normally) AND [2] has NOT been evaluated by doctor (or NP/PA) for this [place], RN, Vincente Liberty 07/08/2022 9:04:27 AM PLEASE NOTE: All timestamps contained within this report are represented as [place] Standard Time. CONFIDENTIALTY NOTICE: This fax transmission is intended only for the addressee. It contains information that is legally privileged, confidential or otherwise protected from use or disclosure. If you are not the intended recipient, you are strictly prohibited from reviewing, disclosing, copying using or disseminating any of this information or taking any action in reliance on or regarding this information. If you have received this fax in error, please notify us immediately by telephone so that we can arrange for its return to [place]. Phone: [phone], Toll-Free: [phone], Fax: [phone] Page: 2 of 2 Call Id: [id number] [place]. Time Eilene Ghazi Time) Disposition Final User 07/08/2022 9:11:16 AM SEE PCP WITHIN 3 DAYS Yes D'Heur Lucia Gaskins, RN, Vincente Liberty Final  Disposition 07/08/2022 9:11:16 AM SEE PCP WITHIN 3 DAYS Yes D'Heur Lucia Gaskins, RN, Vincente Liberty Caller Disagree/Comply Comply Caller Understands Yes PreDisposition Call Doctor Care Advice Given Per Guideline SEE PCP WITHIN 3 DAYS: * You need to be seen within 2 or 3 days. ANOTHER ADULT SHOULD DRIVE: * It is better and safer if another adult drives instead of you. FALL PREVENTION: * Sit at side of bed for several minutes before standing up. Stand up slowly. * Avoid sudden movements of head. CALL BACK IF: * Severe headache occurs * Weakness develops in an arm or leg * Unable to walk without falling * You become worse CARE ADVICE given per Dizziness - Vertigo (Adult) guideline. Comments User: Vincente Liberty, D'Heur Lucia Gaskins, RN Date/Time Eilene Ghazi Time): 07/08/2022 9:04:58 AM Caller states she has had this symptom before. User: Vincente Liberty, D'Heur Lucia Gaskins, RN Date/Time Eilene Ghazi Time): 07/08/2022 9:06:05 AM Caller states she felt a little confused yesterday. User: Vincente Liberty, D'Heur Lucia Gaskins, RN Date/Time Eilene Ghazi Time): 07/08/2022 9:08:42 AM Caller states she saw some "spots" in her vision two weeks ago very briefly, none today

## 2022-07-09 NOTE — Progress Notes (Unsigned)
[facility] [address], [place], [place] [address] [phone]  Date:  07/11/2022   Name:  Melanie Stout   DOB:  1985-12-05   MRN:  [id number]  PCP:  Darreld Mclean, MD    Chief Complaint: No chief complaint on file.   History of Present Illness:  Melanie Stout is a 37 y.o. very pleasant female patient who presents with the following:  Patient seen today for concern of dizzy spells/nausea History of obesity, hyperlipidemia Most recent visit with myself was in [place] time she was actually pregnant and due in October She delivered via C-section on 10/24  Flu vaccine Patient Active Problem List   Diagnosis Date Noted   Pregnancy 03/26/2022   S/P cesarean section 03/26/2022   COVID-19 virus infection 02/13/2021   Obesity, unspecified obesity severity, unspecified obesity type 08/01/2016   Tobacco use disorder 06/05/2016    Past Medical History:  Diagnosis Date   Abnormal Pap smear    Blood transfusion without reported diagnosis 01/05/2013   Headache(784.0)    Hx of varicella    Hyperlipidemia     Past Surgical History:  Procedure Laterality Date   CESAREAN SECTION N/A 03/26/2022   Procedure: CESAREAN SECTION;  Surgeon: Linda Hedges, DO;  Location: [facility] LD [facility]S;  Service: Obstetrics;  Laterality: N/A;   NOSE SURGERY      Social History   Tobacco Use   Smoking status: Former    Packs/day: 0.50    Types: Cigarettes    Quit date: 07/24/2016    Years since quitting: 5.9   Smokeless tobacco: Never  Vaping Use   Vaping Use: Never used  Substance Use Topics   Alcohol use: Not Currently    Comment: occ   Drug use: No    Family History  Problem Relation Age of Onset   Hypertension Mother    Parkinson's disease Father    Diabetes Maternal Aunt     No Known Allergies  Medication list has been reviewed and updated.  Current Outpatient Medications on File Prior to  Visit  Medication Sig Dispense Refill   acetaminophen (TYLENOL) 500 MG tablet Take 500 mg by mouth every 6 (six) hours as needed.     acetaminophen (TYLENOL) 500 MG tablet Take 2 tablets (1,000 mg total) by mouth every 6 (six) hours. 30 tablet 0   Adalimumab (HUMIRA PEN) 80 MG/0.8ML PNKT Inject 80 mg into the skin every 14 (fourteen) days.     Cholecalciferol (VITAMIN D3) 1.25 MG (50000 UT) CAPS Take 1 weekly for 12 weeks (Patient taking differently: Take 50,000 Units by mouth once a week.) 12 capsule 0   ferrous sulfate 325 (65 FE) MG tablet Take 1 tablet (325 mg total) by mouth daily with breakfast. 30 tablet 3   gabapentin (NEURONTIN) 300 MG capsule Take 1 capsule (300 mg total) by mouth at bedtime. 30 capsule 0   ibuprofen (ADVIL) 600 MG tablet Take 1 tablet (600 mg total) by mouth every 6 (six) hours. 30 tablet 0   labetalol (NORMODYNE) 200 MG tablet Take 1 tablet (200 mg total) by mouth 3 (three) times daily. 60 tablet 0   oxyCODONE (OXY IR/ROXICODONE) 5 MG immediate release tablet Take 1 tablet (5 mg total) by mouth every 4 (four) hours as needed for moderate pain. 20 tablet 0   pantoprazole (PROTONIX) 40 MG tablet Take 1 tablet (40 mg total) by mouth daily. 30 tablet 0  polyethylene glycol (MIRALAX / GLYCOLAX) 17 g packet Take 17 g by mouth daily as needed for severe constipation or moderate constipation. 14 each 0   sucralfate (CARAFATE) 1 g tablet Take 1 tablet (1 g total) by mouth 4 (four) times daily -  with meals and at bedtime. 120 tablet 0   No current facility-administered medications on file prior to visit.    Review of Systems:  As per HPI- otherwise negative.   Physical Examination: There were no vitals filed for this visit. There were no vitals filed for this visit. There is no height or weight on file to calculate BMI. Ideal Body Weight:    GEN: no acute distress. HEENT: Atraumatic, Normocephalic.  Ears and Nose: No external deformity. CV: RRR, No M/G/R. No JVD.  No thrill. No extra heart sounds. PULM: CTA B, no wheezes, crackles, rhonchi. No retractions. No resp. distress. No accessory muscle use. ABD: S, NT, ND, +BS. No rebound. No HSM. EXTR: No c/c/e PSYCH: Normally interactive. Conversant.    Assessment and Plan: ***  Signed Lamar Blinks, MD

## 2022-07-09 NOTE — Patient Instructions (Signed)
It was good to see you again today!   

## 2022-07-11 ENCOUNTER — Telehealth: Payer: Self-pay

## 2022-07-11 ENCOUNTER — Telehealth (INDEPENDENT_AMBULATORY_CARE_PROVIDER_SITE_OTHER): Payer: 59 | Admitting: Family Medicine

## 2022-07-11 DIAGNOSIS — R42 Dizziness and giddiness: Secondary | ICD-10-CM

## 2022-07-11 NOTE — Telephone Encounter (Signed)
PCP advised Pt to come in for lab work, pt requested appt at 4:00. Upon checking the latest appt is 3:15pm. Left pt a VM asking her to call the office back to get appt scheduled.

## 2022-07-12 ENCOUNTER — Other Ambulatory Visit (INDEPENDENT_AMBULATORY_CARE_PROVIDER_SITE_OTHER): Payer: 59

## 2022-07-12 DIAGNOSIS — R42 Dizziness and giddiness: Secondary | ICD-10-CM | POA: Diagnosis not present

## 2022-07-12 LAB — CBC
HCT: 35.5 % — ABNORMAL LOW (ref 36.0–46.0)
Hemoglobin: 11 g/dL — ABNORMAL LOW (ref 12.0–15.0)
MCHC: 31 g/dL (ref 30.0–36.0)
MCV: 72.9 fl — ABNORMAL LOW (ref 78.0–100.0)
Platelets: 435 10*3/uL — ABNORMAL HIGH (ref 150.0–400.0)
RBC: 4.87 Mil/uL (ref 3.87–5.11)
RDW: 18.6 % — ABNORMAL HIGH (ref 11.5–15.5)
WBC: 6.4 10*3/uL (ref 4.0–10.5)

## 2022-07-12 LAB — COMPREHENSIVE METABOLIC PANEL
ALT: 11 U/L (ref 0–35)
AST: 13 U/L (ref 0–37)
Albumin: 4.2 g/dL (ref 3.5–5.2)
Alkaline Phosphatase: 86 U/L (ref 39–117)
BUN: 11 mg/dL (ref 6–23)
CO2: 25 mEq/L (ref 19–32)
Calcium: 8.9 mg/dL (ref 8.4–10.5)
Chloride: 108 mEq/L (ref 96–112)
Creatinine, Ser: 0.72 mg/dL (ref 0.40–1.20)
GFR: 106.4 mL/min (ref >60.00)
Glucose, Bld: 73 mg/dL (ref 70–99)
Potassium: 4.4 mEq/L (ref 3.5–5.1)
Sodium: 141 mEq/L (ref 135–145)
Total Bilirubin: 0.3 mg/dL (ref 0.2–1.2)
Total Protein: 6.7 g/dL (ref 6.0–8.3)

## 2022-07-12 LAB — FERRITIN: Ferritin: 2.7 ng/mL — ABNORMAL LOW (ref 10.0–291.0)

## 2022-07-12 LAB — TSH: TSH: 0.77 u[IU]/mL (ref 0.35–5.50)

## 2022-07-13 ENCOUNTER — Encounter: Payer: Self-pay | Admitting: Family Medicine

## 2022-07-30 ENCOUNTER — Ambulatory Visit (INDEPENDENT_AMBULATORY_CARE_PROVIDER_SITE_OTHER): Payer: 59 | Admitting: Family

## 2022-07-30 ENCOUNTER — Encounter: Payer: Self-pay | Admitting: Family

## 2022-07-30 ENCOUNTER — Encounter: Payer: Self-pay | Admitting: Family Medicine

## 2022-07-30 VITALS — BP 118/82 | HR 113 | Temp 98.3°F | Resp 18 | Ht 66.0 in | Wt 257.8 lb

## 2022-07-30 DIAGNOSIS — J019 Acute sinusitis, unspecified: Secondary | ICD-10-CM

## 2022-07-30 DIAGNOSIS — D649 Anemia, unspecified: Secondary | ICD-10-CM

## 2022-07-30 DIAGNOSIS — Z13220 Encounter for screening for lipoid disorders: Secondary | ICD-10-CM

## 2022-07-30 DIAGNOSIS — R790 Abnormal level of blood mineral: Secondary | ICD-10-CM

## 2022-07-30 LAB — LIPID PANEL
Cholesterol: 140 mg/dL (ref 0–200)
HDL: 44.7 mg/dL (ref >39.00)
LDL Cholesterol: 75 mg/dL (ref 0–99)
NonHDL: 95.23
Total CHOL/HDL Ratio: 3
Triglycerides: 100 mg/dL (ref 0.0–149.0)
VLDL: 20 mg/dL (ref 0.0–40.0)

## 2022-07-30 MED ORDER — AMOXICILLIN-POT CLAVULANATE 875-125 MG PO TABS: 1.0000 | ORAL_TABLET | Freq: Two times a day (BID) | ORAL | 0 refills | Status: AC

## 2022-07-30 NOTE — Progress Notes (Signed)
Melanie Stout is a 37 y.o. female with the following history as recorded in [employer]:  Patient Active Problem List   Diagnosis Date Noted   Pregnancy 03/26/2022   S/P cesarean section 03/26/2022   COVID-19 virus infection 02/13/2021   Obesity, unspecified obesity severity, unspecified obesity type 08/01/2016   Tobacco use disorder 06/05/2016    Current Outpatient Medications  Medication Sig Dispense Refill   acetaminophen (TYLENOL) 500 MG tablet Take 500 mg by mouth every 6 (six) hours as needed.     acetaminophen (TYLENOL) 500 MG tablet Take 2 tablets (1,000 mg total) by mouth every 6 (six) hours. 30 tablet 0   Adalimumab (HUMIRA PEN) 80 MG/0.8ML PNKT Inject 80 mg into the skin every 14 (fourteen) days.     amoxicillin-clavulanate (AUGMENTIN) 875-125 MG tablet Take 1 tablet by mouth 2 (two) times daily for 10 days. 20 tablet 0   Cholecalciferol (VITAMIN D3) 1.25 MG (50000 UT) CAPS Take 1 weekly for 12 weeks (Patient taking differently: Take 50,000 Units by mouth once a week.) 12 capsule 0   ferrous sulfate 325 (65 FE) MG tablet Take 1 tablet (325 mg total) by mouth daily with breakfast. 30 tablet 3   ibuprofen (ADVIL) 600 MG tablet Take 1 tablet (600 mg total) by mouth every 6 (six) hours. 30 tablet 0   levonorgestrel (MIRENA, 52 MG,) 20 MCG/DAY IUD Take 1 device by intrauterine route.     polyethylene glycol (MIRALAX / GLYCOLAX) 17 g packet Take 17 g by mouth daily as needed for severe constipation or moderate constipation. (Patient not taking: Reported on 07/30/2022) 14 each 0   sucralfate (CARAFATE) 1 g tablet Take 1 tablet (1 g total) by mouth 4 (four) times daily -  with meals and at bedtime. (Patient not taking: Reported on 07/30/2022) 120 tablet 0   No current facility-administered medications for this visit.    Allergies: Patient has no known allergies.  Past Medical History:  Diagnosis Date   Abnormal Pap smear    Blood transfusion without reported diagnosis  01/05/2013   Headache(784.0)    Hx of varicella    Hyperlipidemia     Past Surgical History:  Procedure Laterality Date   CESAREAN SECTION N/A 03/26/2022   Procedure: CESAREAN SECTION;  Surgeon: Linda Hedges, DO;  Location: [facility] LD [facility]S;  Service: Obstetrics;  Laterality: N/A;   NOSE SURGERY      Family History  Problem Relation Age of Onset   Hypertension Mother    Parkinson's disease Father    Diabetes Maternal Aunt     Social History   Tobacco Use   Smoking status: Former    Packs/day: 0.50    Types: Cigarettes    Quit date: 07/24/2016    Years since quitting: 6.0   Smokeless tobacco: Never  Substance Use Topics   Alcohol use: Not Currently    Comment: occ    Subjective:  Patient presents with concerns for acute cough/congestion/ sinus infection; symptoms x 4-5 days;   Also concerned about how tired/ dizzy she has been feeling; was found to be anemic with recent set of labs- has been having trouble with chronic bleeding since having IUD;   Also needs form completed for employer- biometric screen;    Objective:  Vitals:   07/30/22 1125  BP: 118/82  Pulse: (!) 113  Resp: 18  Temp: 98.3 F (36.8 C)  TempSrc: Oral  SpO2: 98%  Weight: 257 lb 12.8 oz (116.9 kg)  Height: '5\' 6"'$  (1.676  m)    General: Well developed, well nourished, in no acute distress  Skin : Warm and dry.  Head: Normocephalic and atraumatic  Eyes: Sclera and conjunctiva clear; pupils round and reactive to light; extraocular movements intact  Ears: External normal; canals clear; tympanic membranes normal  Oropharynx: Pink, supple. No suspicious lesions  Neck: Supple without thyromegaly, adenopathy  Lungs: Respirations unlabored; clear to auscultation bilaterally without wheeze, rales, rhonchi  CVS exam: normal rate and regular rhythm.  Neurologic: Alert and oriented; speech intact; face symmetrical; moves all extremities well; CNII-XII intact without focal deficit   Assessment:  1. Acute  sinusitis, recurrence not specified, unspecified location   2. Lipid screening   3. Symptomatic anemia   4. Low ferritin     Plan:  Rx for Augmentin 875 mg bid x 10 days; Update lipid screening; will complete form for patient as requested;  3. & 4. Refer to hematology to discuss iron infusions; needs to follow up with GYN to discuss replacing IUD;   No follow-ups on file.  Orders Placed This Encounter  Procedures   Lipid panel   Ambulatory referral to Hematology / Oncology    Referral Priority:   Routine    Referral Type:   Consultation    Referral Reason:   Specialty Services Required    Number of Visits Requested:   1    Requested Prescriptions   Signed Prescriptions Disp Refills   amoxicillin-clavulanate (AUGMENTIN) 875-125 MG tablet 20 tablet 0    Sig: Take 1 tablet by mouth 2 (two) times daily for 10 days.

## 2022-08-01 ENCOUNTER — Encounter: Payer: Self-pay | Admitting: Family Medicine

## 2022-08-01 ENCOUNTER — Encounter: Payer: Self-pay | Admitting: Family

## 2022-08-02 ENCOUNTER — Inpatient Hospital Stay (HOSPITAL_BASED_OUTPATIENT_CLINIC_OR_DEPARTMENT_OTHER): Payer: 59 | Admitting: Family

## 2022-08-02 ENCOUNTER — Other Ambulatory Visit: Payer: Self-pay | Admitting: Family

## 2022-08-02 ENCOUNTER — Encounter: Payer: Self-pay | Admitting: Family

## 2022-08-02 ENCOUNTER — Inpatient Hospital Stay: Payer: 59 | Attending: Hematology & Oncology

## 2022-08-02 VITALS — BP 149/91 | HR 66 | Temp 98.2°F | Resp 17 | Wt 263.0 lb

## 2022-08-02 DIAGNOSIS — D649 Anemia, unspecified: Secondary | ICD-10-CM

## 2022-08-02 DIAGNOSIS — Z79899 Other long term (current) drug therapy: Secondary | ICD-10-CM | POA: Diagnosis not present

## 2022-08-02 DIAGNOSIS — I10 Essential (primary) hypertension: Secondary | ICD-10-CM | POA: Insufficient documentation

## 2022-08-02 DIAGNOSIS — Z87891 Personal history of nicotine dependence: Secondary | ICD-10-CM | POA: Diagnosis not present

## 2022-08-02 DIAGNOSIS — E785 Hyperlipidemia, unspecified: Secondary | ICD-10-CM | POA: Insufficient documentation

## 2022-08-02 DIAGNOSIS — D509 Iron deficiency anemia, unspecified: Secondary | ICD-10-CM | POA: Insufficient documentation

## 2022-08-02 DIAGNOSIS — D500 Iron deficiency anemia secondary to blood loss (chronic): Secondary | ICD-10-CM

## 2022-08-02 DIAGNOSIS — Z793 Long term (current) use of hormonal contraceptives: Secondary | ICD-10-CM | POA: Diagnosis not present

## 2022-08-02 LAB — CMP (CANCER CENTER ONLY)
ALT: 12 U/L (ref 0–44)
AST: 14 U/L — ABNORMAL LOW (ref 15–41)
Albumin: 4.1 g/dL (ref 3.5–5.0)
Alkaline Phosphatase: 84 U/L (ref 38–126)
Anion gap: 8 (ref 5–15)
BUN: 9 mg/dL (ref 6–20)
CO2: 24 mmol/L (ref 22–32)
Calcium: 9.5 mg/dL (ref 8.9–10.3)
Chloride: 107 mmol/L (ref 98–111)
Creatinine: 0.77 mg/dL (ref 0.44–1.00)
GFR, Estimated: >60 mL/min (ref >60)
Glucose, Bld: 102 mg/dL — ABNORMAL HIGH (ref 70–99)
Potassium: 3.8 mmol/L (ref 3.5–5.1)
Sodium: 139 mmol/L (ref 135–145)
Total Bilirubin: 0.2 mg/dL — ABNORMAL LOW (ref 0.3–1.2)
Total Protein: 6.7 g/dL (ref 6.5–8.1)

## 2022-08-02 LAB — CBC WITH DIFFERENTIAL (CANCER CENTER ONLY)
Abs Immature Granulocytes: 0.09 10*3/uL — ABNORMAL HIGH (ref 0.00–0.07)
Basophils Absolute: 0 10*3/uL (ref 0.0–0.1)
Basophils Relative: 1 %
Eosinophils Absolute: 0.3 10*3/uL (ref 0.0–0.5)
Eosinophils Relative: 4 %
HCT: 36.4 % (ref 36.0–46.0)
Hemoglobin: 11.1 g/dL — ABNORMAL LOW (ref 12.0–15.0)
Immature Granulocytes: 1 %
Lymphocytes Relative: 48 %
Lymphs Abs: 3.2 10*3/uL (ref 0.7–4.0)
MCH: 22.9 pg — ABNORMAL LOW (ref 26.0–34.0)
MCHC: 30.5 g/dL (ref 30.0–36.0)
MCV: 75.1 fL — ABNORMAL LOW (ref 80.0–100.0)
Monocytes Absolute: 0.4 10*3/uL (ref 0.1–1.0)
Monocytes Relative: 7 %
Neutro Abs: 2.6 10*3/uL (ref 1.7–7.7)
Neutrophils Relative %: 39 %
Platelet Count: 386 10*3/uL (ref 150–400)
RBC: 4.85 MIL/uL (ref 3.87–5.11)
RDW: 18.3 % — ABNORMAL HIGH (ref 11.5–15.5)
Smear Review: NORMAL
WBC Count: 6.7 10*3/uL (ref 4.0–10.5)
nRBC: 0 % (ref 0.0–0.2)

## 2022-08-02 LAB — RETICULOCYTES
Immature Retic Fract: 16 % — ABNORMAL HIGH (ref 2.3–15.9)
RBC.: 4.92 MIL/uL (ref 3.87–5.11)
Retic Count, Absolute: 71.8 10*3/uL (ref 19.0–186.0)
Retic Ct Pct: 1.5 % (ref 0.4–3.1)

## 2022-08-02 NOTE — Progress Notes (Signed)
Hematology/Oncology Consultation   Name: Melanie Stout      MRN: [id number]    Location: Room/bed info not found  Date: 08/02/2022 Time:4:16 PM   REFERRING PHYSICIAN: Sherlene Shams, FNP  REASON FOR CONSULT:  Symptomatic anemia, low ferritin   DIAGNOSIS: Iron deficiency anemia   HISTORY OF PRESENT ILLNESS:  Melanie Stout is a very pleasant 37 yo African American female with history of iron deficiency anemia secondary to heavy cycle.   Ferritin two weeks ago was only 2.  She has tried taking oral iron in the past with no improvement.  She has had 2 babies in the last 2 years. She had an IUD placed and unfortunately had heavy continuous cycles resulting in this being removed earlier this week.  She has not noted any other blood loss. No bruising or petechiae.  She has discussed contraception options and she is considering the pill vs Implanon. They are avoiding estrogen based therapies due to new onset of HTN.   She is symptomatic with fatigue, weakness, dizziness, mild SOB with exertion, brain fog, ice cravings, numbness and tingling in the right arm and feet that comes and goes.  No known familial history of anemia.  She has 3 children and history of multiple miscarriages within the first trimester.  She has had a c-section and nasal surgery in the past without any complications.  No history of diabetes or thyroid disease.  No personal or known familial history of cancer.  She is on Humira and uses once a month for history of hidradenitis.  No fever, n/v, cough, rash, chest pain, abdominal pain or changes in bowel or bladder habits.  No swelling or tenderness in her extremities.  No falls or syncope reported. Appetite and hydration are good. Weight is stable at 263 lbs.  She denies smoking or recreational drug use. Rare ETOH socially.  She has a high stress job working in collections.   ROS: All other 10 point review of systems is negative.   PAST MEDICAL HISTORY:    Past Medical History:  Diagnosis Date   Abnormal Pap smear    Blood transfusion without reported diagnosis 01/05/2013   Headache(784.0)    Hx of varicella    Hyperlipidemia     ALLERGIES: No Known Allergies    MEDICATIONS:  Current Outpatient Medications on File Prior to Visit  Medication Sig Dispense Refill   acetaminophen (TYLENOL) 500 MG tablet Take 500 mg by mouth every 6 (six) hours as needed.     acetaminophen (TYLENOL) 500 MG tablet Take 2 tablets (1,000 mg total) by mouth every 6 (six) hours. 30 tablet 0   Adalimumab (HUMIRA PEN) 80 MG/0.8ML PNKT Inject 80 mg into the skin every 14 (fourteen) days.     amoxicillin-clavulanate (AUGMENTIN) 875-125 MG tablet Take 1 tablet by mouth 2 (two) times daily for 10 days. 20 tablet 0   Cholecalciferol (VITAMIN D3) 1.25 MG (50000 UT) CAPS Take 1 weekly for 12 weeks (Patient taking differently: Take 50,000 Units by mouth once a week.) 12 capsule 0   ferrous sulfate 325 (65 FE) MG tablet Take 1 tablet (325 mg total) by mouth daily with breakfast. 30 tablet 3   ibuprofen (ADVIL) 600 MG tablet Take 1 tablet (600 mg total) by mouth every 6 (six) hours. 30 tablet 0   levonorgestrel (MIRENA, 52 MG,) 20 MCG/DAY IUD Take 1 device by intrauterine route.     MELATONIN PO Take by mouth as needed. Unsure dose  polyethylene glycol (MIRALAX / GLYCOLAX) 17 g packet Take 17 g by mouth daily as needed for severe constipation or moderate constipation. (Patient not taking: Reported on 07/30/2022) 14 each 0   sucralfate (CARAFATE) 1 g tablet Take 1 tablet (1 g total) by mouth 4 (four) times daily -  with meals and at bedtime. (Patient not taking: Reported on 07/30/2022) 120 tablet 0   No current facility-administered medications on file prior to visit.     PAST SURGICAL HISTORY Past Surgical History:  Procedure Laterality Date   CESAREAN SECTION N/A 03/26/2022   Procedure: CESAREAN SECTION;  Surgeon: Linda Hedges, DO;  Location: [facility] LD [facility]S;  Service:  Obstetrics;  Laterality: N/A;   NOSE SURGERY      FAMILY HISTORY: Family History  Problem Relation Age of Onset   Hypertension Mother    Parkinson's disease Father    Diabetes Maternal Aunt     SOCIAL HISTORY:  reports that she quit smoking about 6 years ago. Her smoking use included cigarettes. She smoked an average of .5 packs per day. She has never used smokeless tobacco. She reports that she does not currently use alcohol. She reports that she does not use drugs.  PERFORMANCE STATUS: The patient's performance status is 1 - Symptomatic but completely ambulatory  PHYSICAL EXAM: Most Recent Vital Signs: Blood pressure (!) 149/91, pulse 66, temperature 98.2 F (36.8 C), temperature source Oral, resp. rate 17, weight 263 lb (119.3 kg), SpO2 100 %, not currently breastfeeding. BP (!) 149/91 (BP Location: Right Arm, Patient Position: Sitting)   Pulse 66   Temp 98.2 F (36.8 C) (Oral)   Resp 17   Wt 263 lb (119.3 kg)   SpO2 100%   BMI 42.45 kg/m   General Appearance:    Alert, cooperative, no distress, appears stated age  Head:    Normocephalic, without obvious abnormality, atraumatic  Eyes:    PERRL, conjunctiva/corneas clear, EOM's intact, fundi    benign, both eyes        Throat:   Lips, mucosa, and tongue normal; teeth and gums normal  Neck:   Supple, symmetrical, trachea midline, no adenopathy;    thyroid:  no enlargement/tenderness/nodules; no carotid   bruit or JVD  Back:     Symmetric, no curvature, ROM normal, no CVA tenderness  Lungs:     Clear to auscultation bilaterally, respirations unlabored  Chest Wall:    No tenderness or deformity   Heart:    Regular rate and rhythm, S1 and S2 normal, no murmur, rub   or gallop     Abdomen:     Soft, non-tender, bowel sounds active all four quadrants,    no masses, no organomegaly        Extremities:   Extremities normal, atraumatic, no cyanosis or edema  Pulses:   2+ and symmetric all extremities  Skin:   Skin color,  texture, turgor normal, no rashes or lesions  Lymph nodes:   Cervical, supraclavicular, and axillary nodes normal  Neurologic:   CNII-XII intact, normal strength, sensation and reflexes    throughout    LABORATORY DATA:  Results for orders placed or performed in visit on 08/02/22 (from the past 48 hour(s))  CBC with Differential (Cancer Center Only)     Status: Abnormal   Collection Time: 08/02/22  2:56 PM  Result Value Ref Range   WBC Count 6.7 4.0 - 10.5 K/uL   RBC 4.85 3.87 - 5.11 MIL/uL   Hemoglobin 11.1 (L) 12.0 -  15.0 g/dL   HCT 36.4 36.0 - 46.0 %   MCV 75.1 (L) 80.0 - 100.0 fL   MCH 22.9 (L) 26.0 - 34.0 pg   MCHC 30.5 30.0 - 36.0 g/dL   RDW 18.3 (H) 11.5 - 15.5 %   Platelet Count 386 150 - 400 K/uL   nRBC 0.0 0.0 - 0.2 %   Neutrophils Relative % 39 %   Neutro Abs 2.6 1.7 - 7.7 K/uL   Lymphocytes Relative 48 %   Lymphs Abs 3.2 0.7 - 4.0 K/uL   Monocytes Relative 7 %   Monocytes Absolute 0.4 0.1 - 1.0 K/uL   Eosinophils Relative 4 %   Eosinophils Absolute 0.3 0.0 - 0.5 K/uL   Basophils Relative 1 %   Basophils Absolute 0.0 0.0 - 0.1 K/uL   WBC Morphology MORPHOLOGY UNREMARKABLE    RBC Morphology MORPHOLOGY UNREMARKABLE    Smear Review Normal platelet morphology    Immature Granulocytes 1 %   Abs Immature Granulocytes 0.09 (H) 0.00 - 0.07 K/uL    Comment: Performed at [facility] at [facility], [address], [place], [place] [address]  Reticulocytes     Status: Abnormal   Collection Time: 08/02/22  2:56 PM  Result Value Ref Range   Retic Ct Pct 1.5 0.4 - 3.1 %   RBC. 4.92 3.87 - 5.11 MIL/uL   Retic Count, Absolute 71.8 19.0 - 186.0 K/uL   Immature Retic Fract 16.0 (H) 2.3 - 15.9 %    Comment: Performed at [facility] at [facility], [address] [place], [place] [address]  CMP ([place] only)     Status: Abnormal   Collection Time: 08/02/22  2:56 PM  Result Value Ref Range   Sodium 139 135 - 145  mmol/L   Potassium 3.8 3.5 - 5.1 mmol/L   Chloride 107 98 - 111 mmol/L   CO2 24 22 - 32 mmol/L   Glucose, Bld 102 (H) 70 - 99 mg/dL    Comment: Glucose reference range applies only to samples taken after fasting for at least 8 hours.   BUN 9 6 - 20 mg/dL   Creatinine 0.77 0.44 - 1.00 mg/dL   Calcium 9.5 8.9 - 10.3 mg/dL   Total Protein 6.7 6.5 - 8.1 g/dL   Albumin 4.1 3.5 - 5.0 g/dL   AST 14 (L) 15 - 41 U/L   ALT 12 0 - 44 U/L   Alkaline Phosphatase 84 38 - 126 U/L   Total Bilirubin 0.2 (L) 0.3 - 1.2 mg/dL   GFR, Estimated >60 >60 mL/min    Comment: (NOTE) Calculated using the CKD-EPI Creatinine Equation (2021)    Anion gap 8 5 - 15    Comment: Performed at [facility] at [facility], [address], [place],  [address]      RADIOGRAPHY: No results found.     PATHOLOGY: None  ASSESSMENT/PLAN: Ms. Caminero is a very pleasant 37 yo African American female with history of iron deficiency anemia secondary to heavy cycle.   We will get her set up for 3 doses of IV iron starting next week.  Full anemia panel pending. She may also benefit from adding Folic acid to her daily regimen.  Follow-up in 8 weeks.   All questions were answered. The patient knows to call the clinic with any problems, questions or concerns. We can certainly see the patient much  sooner if necessary.   Lottie Dawson, NP

## 2022-08-03 LAB — ERYTHROPOIETIN: Erythropoietin: 18.6 m[IU]/mL — ABNORMAL HIGH (ref 2.6–18.5)

## 2022-08-05 ENCOUNTER — Other Ambulatory Visit: Payer: Self-pay | Admitting: Family

## 2022-08-05 LAB — IRON AND IRON BINDING CAPACITY (CC-WL,HP ONLY)
Iron: 35 ug/dL (ref 28–170)
Saturation Ratios: 8 % — ABNORMAL LOW (ref 10.4–31.8)
TIBC: 417 ug/dL (ref 250–450)
UIBC: 382 ug/dL (ref 148–442)

## 2022-08-07 LAB — HGB FRACTIONATION CASCADE
Hgb A2: 2.3 % (ref 1.8–3.2)
Hgb A: 97.7 % (ref 96.4–98.8)
Hgb F: 0 % (ref 0.0–2.0)
Hgb S: 0 %

## 2022-08-13 ENCOUNTER — Inpatient Hospital Stay: Payer: 59 | Attending: Hematology & Oncology

## 2022-08-13 VITALS — BP 125/80 | HR 78

## 2022-08-13 DIAGNOSIS — Z79899 Other long term (current) drug therapy: Secondary | ICD-10-CM | POA: Insufficient documentation

## 2022-08-13 DIAGNOSIS — D509 Iron deficiency anemia, unspecified: Secondary | ICD-10-CM | POA: Insufficient documentation

## 2022-08-13 DIAGNOSIS — D500 Iron deficiency anemia secondary to blood loss (chronic): Secondary | ICD-10-CM

## 2022-08-13 MED ORDER — ACETAMINOPHEN 325 MG PO TABS
650.0000 mg | ORAL_TABLET | Freq: Once | ORAL | Status: AC
  Administered 2022-08-13: 650 mg via ORAL
  Filled 2022-08-13: qty 2

## 2022-08-13 MED ORDER — SODIUM CHLORIDE 0.9 % IV SOLN: Freq: Once | INTRAVENOUS | Status: AC

## 2022-08-13 MED ORDER — SODIUM CHLORIDE 0.9 % IV SOLN
300.0000 mg | Freq: Once | INTRAVENOUS | Status: AC
  Administered 2022-08-13: 300 mg via INTRAVENOUS
  Filled 2022-08-13: qty 300

## 2022-08-13 NOTE — Patient Instructions (Signed)

## 2022-08-14 ENCOUNTER — Telehealth: Payer: Self-pay | Admitting: Family Medicine

## 2022-08-14 NOTE — Telephone Encounter (Signed)
Spoke with pt, pt stated she is NOT currently breast feeding.

## 2022-08-14 NOTE — Telephone Encounter (Signed)
Pt called stating that the amoxicillin that she was prescribed isn't working for her Sinus issues and was wondering if something else could be sent in to treat this.

## 2022-08-15 ENCOUNTER — Other Ambulatory Visit (HOSPITAL_BASED_OUTPATIENT_CLINIC_OR_DEPARTMENT_OTHER): Payer: Self-pay

## 2022-08-15 ENCOUNTER — Other Ambulatory Visit: Payer: Self-pay | Admitting: Family

## 2022-08-15 MED ORDER — DOXYCYCLINE HYCLATE 100 MG PO TABS
100.0000 mg | ORAL_TABLET | Freq: Two times a day (BID) | ORAL | 0 refills | Status: DC
  Filled 2022-08-15: qty 14, 7d supply, fill #0

## 2022-08-15 NOTE — Telephone Encounter (Signed)
Spoke with pt, pt is aware Rx has been sent in.

## 2022-08-15 NOTE — Telephone Encounter (Signed)
Patient called back today stating she was promised a medication was going to be sent to her pharmacy yesterday afternoon and she still has not received it. Please advise.

## 2022-08-16 LAB — ALPHA-THALASSEMIA GENOTYPR

## 2022-08-19 ENCOUNTER — Telehealth: Payer: Self-pay

## 2022-08-19 ENCOUNTER — Other Ambulatory Visit (HOSPITAL_BASED_OUTPATIENT_CLINIC_OR_DEPARTMENT_OTHER): Payer: Self-pay

## 2022-08-19 ENCOUNTER — Other Ambulatory Visit: Payer: Self-pay | Admitting: Family

## 2022-08-19 DIAGNOSIS — D500 Iron deficiency anemia secondary to blood loss (chronic): Secondary | ICD-10-CM

## 2022-08-19 MED ORDER — FOLIC ACID 1 MG PO TABS
1.0000 mg | ORAL_TABLET | Freq: Every day | ORAL | 11 refills | Status: DC
  Filled 2022-08-19: qty 30, 30d supply, fill #0

## 2022-08-19 NOTE — Telephone Encounter (Signed)
-----   Message from Celso Amy, NP sent at 08/19/2022  2:47 PM EDT ----- She was positive for the alpha thalassemia minor trait. This is quite common in the African American population and means she is prone to a mild anemia and smaller red cells. The treatment is daily folic acid which I have set to her pharmacy. Thank you!  Sarah  ----- Message ----- From: Buel Ream, Lab [facility] [place] Sent: 08/02/2022   3:13 PM EDT To: Celso Amy, NP

## 2022-08-20 ENCOUNTER — Inpatient Hospital Stay: Payer: 59

## 2022-08-21 ENCOUNTER — Inpatient Hospital Stay: Payer: 59

## 2022-08-21 VITALS — BP 134/72 | HR 72 | Temp 98.0°F | Resp 16

## 2022-08-21 DIAGNOSIS — D500 Iron deficiency anemia secondary to blood loss (chronic): Secondary | ICD-10-CM

## 2022-08-21 DIAGNOSIS — D509 Iron deficiency anemia, unspecified: Secondary | ICD-10-CM | POA: Diagnosis not present

## 2022-08-21 MED ORDER — SODIUM CHLORIDE 0.9 % IV SOLN
300.0000 mg | Freq: Once | INTRAVENOUS | Status: AC
  Administered 2022-08-21: 300 mg via INTRAVENOUS
  Filled 2022-08-21: qty 300

## 2022-08-21 MED ORDER — SODIUM CHLORIDE 0.9 % IV SOLN: Freq: Once | INTRAVENOUS | Status: AC

## 2022-08-21 NOTE — Patient Instructions (Signed)

## 2022-08-26 ENCOUNTER — Other Ambulatory Visit (HOSPITAL_BASED_OUTPATIENT_CLINIC_OR_DEPARTMENT_OTHER): Payer: Self-pay

## 2022-08-27 ENCOUNTER — Inpatient Hospital Stay: Payer: 59

## 2022-08-28 ENCOUNTER — Inpatient Hospital Stay: Payer: 59

## 2022-08-28 VITALS — BP 144/80 | HR 67 | Temp 97.9°F | Resp 17

## 2022-08-28 DIAGNOSIS — D500 Iron deficiency anemia secondary to blood loss (chronic): Secondary | ICD-10-CM

## 2022-08-28 DIAGNOSIS — D509 Iron deficiency anemia, unspecified: Secondary | ICD-10-CM | POA: Diagnosis not present

## 2022-08-28 MED ORDER — SODIUM CHLORIDE 0.9 % IV SOLN: Freq: Once | INTRAVENOUS | Status: AC

## 2022-08-28 MED ORDER — SODIUM CHLORIDE 0.9 % IV SOLN
300.0000 mg | Freq: Once | INTRAVENOUS | Status: AC
  Administered 2022-08-28: 300 mg via INTRAVENOUS
  Filled 2022-08-28: qty 300

## 2022-08-28 NOTE — Patient Instructions (Signed)

## 2022-09-19 IMAGING — DX DG LUMBAR SPINE COMPLETE 4+V
5 series · 5 of 5 positions shown · non-contrast
Comparison: None.

CLINICAL DATA: pain in back

EXAM:
LUMBAR SPINE - COMPLETE 4+ VIEW

[l-spine ap]
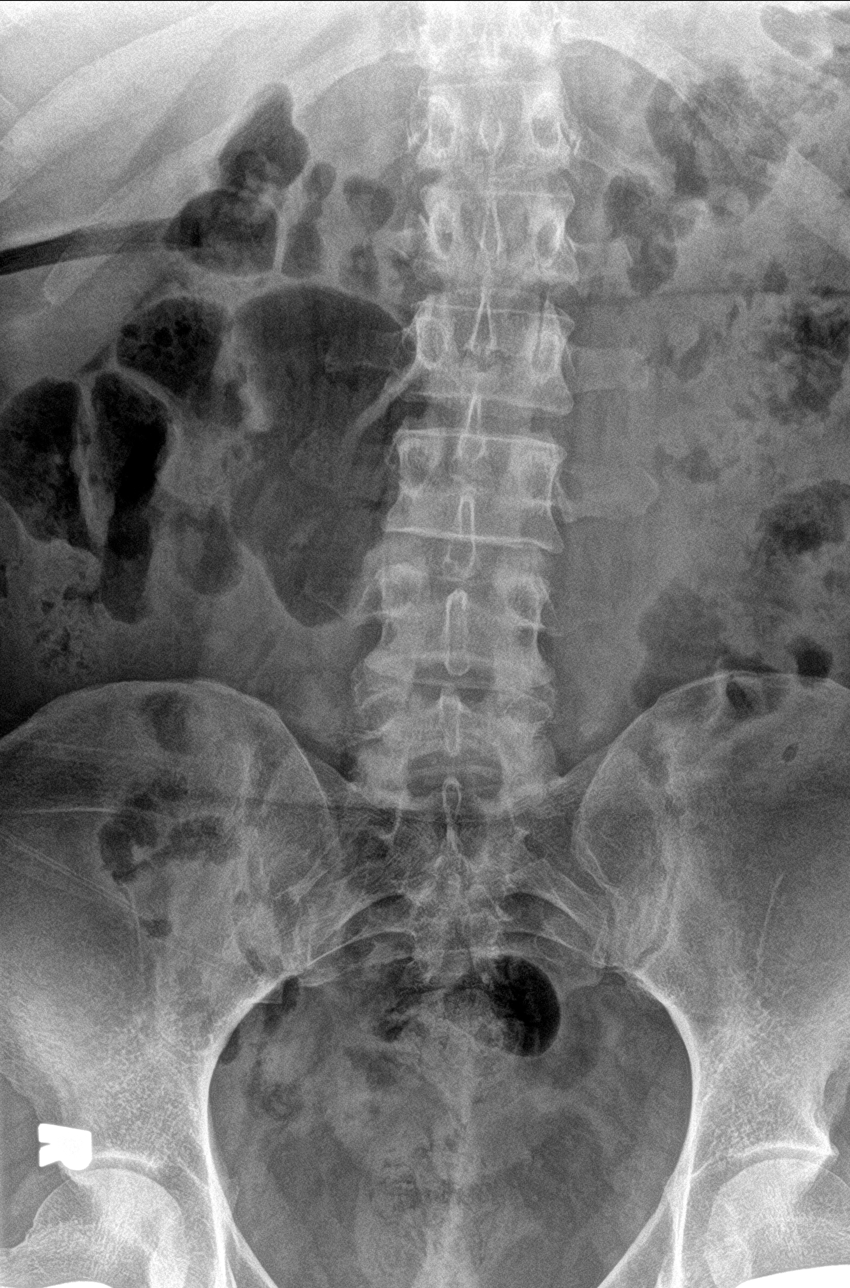

[l-spine obl (1 of 2)]
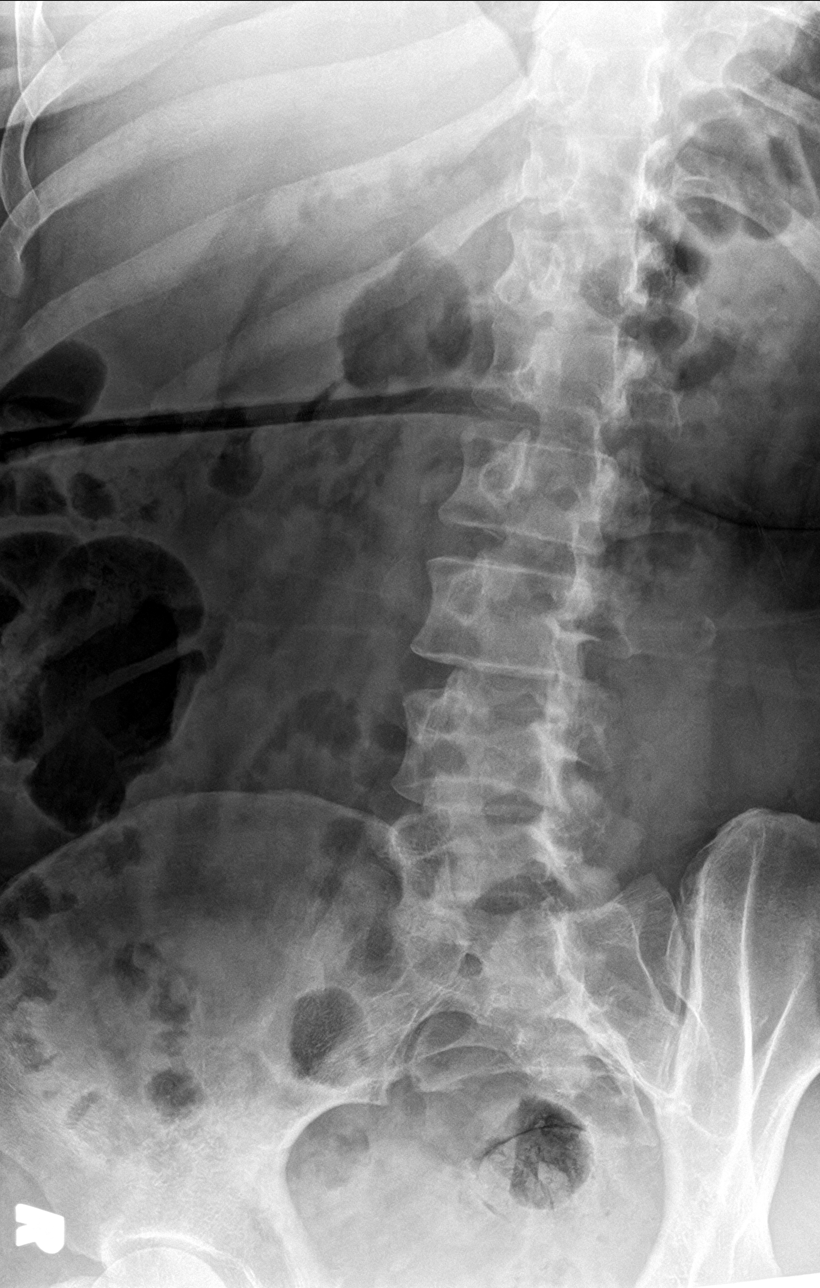

[l-spine obl (2 of 2)]
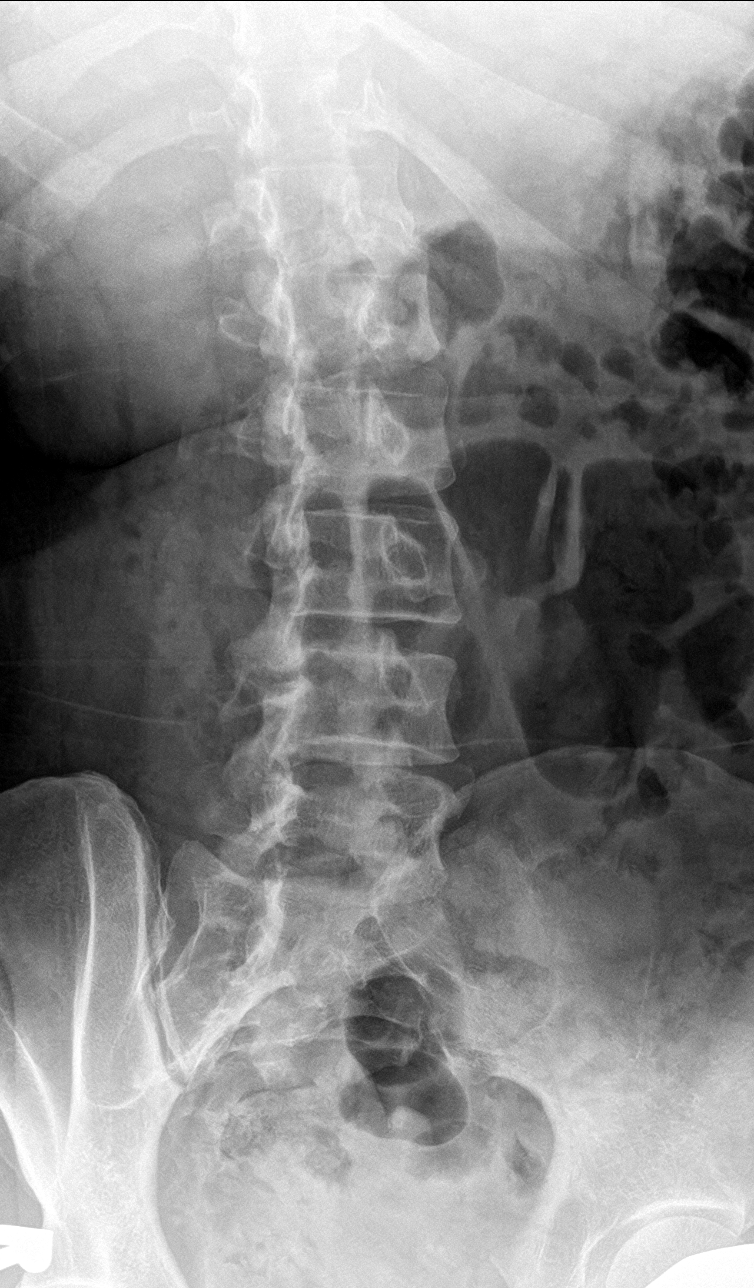

[l-spine lat]
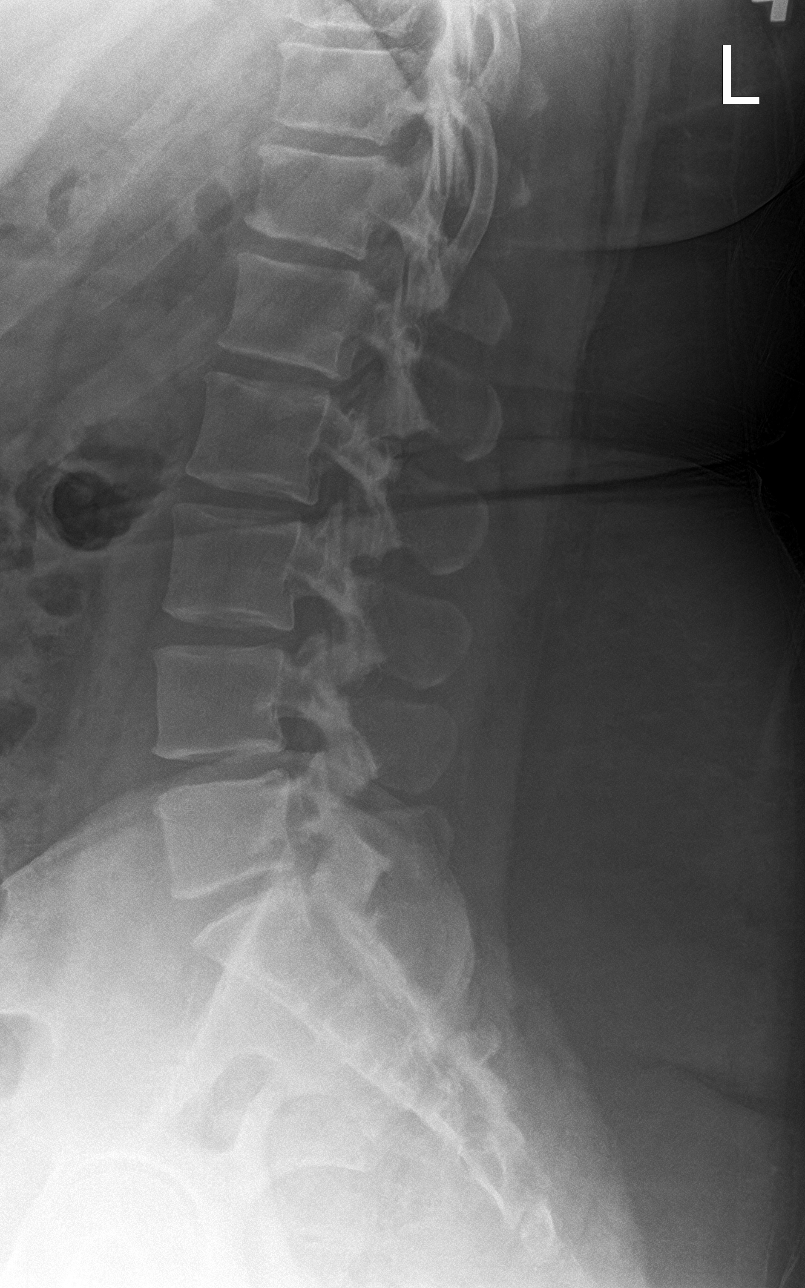

[l-spine spot]
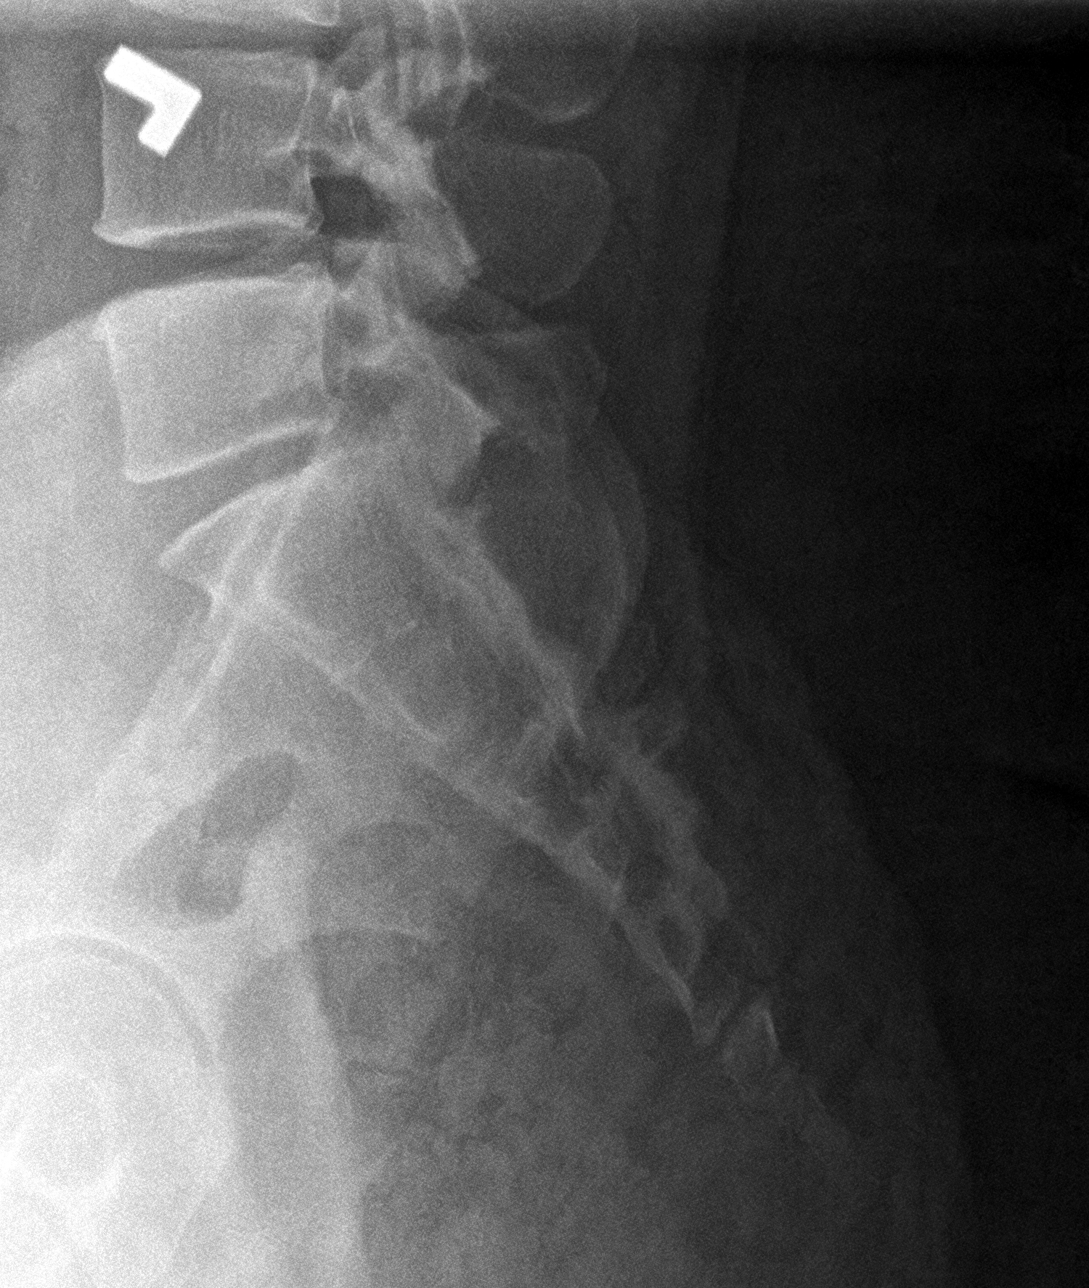

[5 of 5 positions shown; findings below may reference images not displayed]

FINDINGS: There are five non-rib bearing lumbar-type vertebral bodies with
riblets at T12. Mild levocurvature of the thoracolumbar junction. No
spondylolisthesis. There is no evidence for acute fracture or
subluxation. Mild multilevel endplate proliferative changes with
relative preservation of the disc spaces. Visualized abdomen is
unremarkable.
IMPRESSION: Mild multilevel degenerative changes.

## 2022-09-23 ENCOUNTER — Encounter: Payer: Self-pay | Admitting: Family Medicine

## 2022-09-24 ENCOUNTER — Encounter: Payer: Self-pay | Admitting: Family

## 2022-10-01 ENCOUNTER — Encounter: Payer: Self-pay | Admitting: Family

## 2022-10-01 ENCOUNTER — Inpatient Hospital Stay: Payer: Medicaid Other | Attending: Hematology & Oncology

## 2022-10-01 ENCOUNTER — Inpatient Hospital Stay (HOSPITAL_BASED_OUTPATIENT_CLINIC_OR_DEPARTMENT_OTHER): Payer: Medicaid Other | Admitting: Family

## 2022-10-01 VITALS — BP 123/88 | HR 86 | Temp 98.4°F | Resp 17 | Wt 262.0 lb

## 2022-10-01 DIAGNOSIS — D500 Iron deficiency anemia secondary to blood loss (chronic): Secondary | ICD-10-CM

## 2022-10-01 DIAGNOSIS — D509 Iron deficiency anemia, unspecified: Secondary | ICD-10-CM | POA: Diagnosis present

## 2022-10-01 DIAGNOSIS — D563 Thalassemia minor: Secondary | ICD-10-CM | POA: Diagnosis not present

## 2022-10-01 LAB — CBC WITH DIFFERENTIAL (CANCER CENTER ONLY)
Abs Immature Granulocytes: 0.05 10*3/uL (ref 0.00–0.07)
Basophils Absolute: 0 10*3/uL (ref 0.0–0.1)
Basophils Relative: 1 %
Eosinophils Absolute: 0.2 10*3/uL (ref 0.0–0.5)
Eosinophils Relative: 2 %
HCT: 38.9 % (ref 36.0–46.0)
Hemoglobin: 12.8 g/dL (ref 12.0–15.0)
Immature Granulocytes: 1 %
Lymphocytes Relative: 44 %
Lymphs Abs: 2.9 10*3/uL (ref 0.7–4.0)
MCH: 25.9 pg — ABNORMAL LOW (ref 26.0–34.0)
MCHC: 32.9 g/dL (ref 30.0–36.0)
MCV: 78.7 fL — ABNORMAL LOW (ref 80.0–100.0)
Monocytes Absolute: 0.5 10*3/uL (ref 0.1–1.0)
Monocytes Relative: 8 %
Neutro Abs: 2.8 10*3/uL (ref 1.7–7.7)
Neutrophils Relative %: 44 %
Platelet Count: 284 10*3/uL (ref 150–400)
RBC: 4.94 MIL/uL (ref 3.87–5.11)
RDW: 18.1 % — ABNORMAL HIGH (ref 11.5–15.5)
WBC Count: 6.5 10*3/uL (ref 4.0–10.5)
nRBC: 0 % (ref 0.0–0.2)

## 2022-10-01 LAB — RETICULOCYTES
Immature Retic Fract: 10.3 % (ref 2.3–15.9)
RBC.: 4.98 MIL/uL (ref 3.87–5.11)
Retic Count, Absolute: 98.6 10*3/uL (ref 19.0–186.0)
Retic Ct Pct: 2 % (ref 0.4–3.1)

## 2022-10-01 LAB — FERRITIN: Ferritin: 52 ng/mL (ref 11–307)

## 2022-10-01 LAB — IRON AND IRON BINDING CAPACITY (CC-WL,HP ONLY)
Iron: 80 ug/dL (ref 28–170)
Saturation Ratios: 22 % (ref 10.4–31.8)
TIBC: 365 ug/dL (ref 250–450)
UIBC: 285 ug/dL (ref 148–442)

## 2022-10-01 NOTE — Progress Notes (Signed)
Hematology and Oncology Follow Up Visit  Melanie Stout [id number] 03-01-1986 37 y.o. 10/01/2022   Principle Diagnosis:  Iron deficiency anemia  Alpha thalassemia minor trait  Current Therapy:   IV iron as indicated  Folic acid 1 mg daily PO   Interim History:  Melanie Stout is here today for follow-up. She is symptomatic with fatigue, SOB and palpitations with any exertion.  She states that the dizziness and "spinning" never improved even after receiving IV iron. She will follow-up PCP regarding possible vertigo.  No blood loss noted. No bruising or petechiae.  No fever, chills, n/v, cough, rash, chest pain, abdominal pain or changes in bladder habits.  She has had looser stools for the last month. She bought some fiber to try and help. If this does not work she will try taking a daily probiotic.  No swelling, numbness or tingling in her extremities at this time.  No falls or syncope.  Appetite and hydration are good. Weight is stable at 262 lbs.   ECOG Performance Status: 1 - Symptomatic but completely ambulatory  Medications:  Allergies as of 10/01/2022   No Known Allergies      Medication List        Accurate as of October 01, 2022  1:34 PM. If you have any questions, ask your nurse or doctor.          acetaminophen 500 MG tablet Commonly known as: TYLENOL Take 500 mg by mouth every 6 (six) hours as needed.   acetaminophen 500 MG tablet Commonly known as: TYLENOL Take 2 tablets (1,000 mg total) by mouth every 6 (six) hours.   doxycycline 100 MG tablet Commonly known as: VIBRA-TABS Take 1 tablet (100 mg total) by mouth 2 (two) times daily.   ferrous sulfate 325 (65 FE) MG tablet Take 1 tablet (325 mg total) by mouth daily with breakfast.   folic acid 1 MG tablet Commonly known as: FOLVITE Take 1 tablet (1 mg total) by mouth daily.   Humira (2 Pen) 80 MG/0.8ML Pnkt Generic drug: Adalimumab Inject 80 mg into the skin every 14 (fourteen)  days.   ibuprofen 600 MG tablet Commonly known as: ADVIL Take 1 tablet (600 mg total) by mouth every 6 (six) hours.   MELATONIN PO Take by mouth as needed. Unsure dose   Mirena (52 MG) 20 MCG/DAY Iud Generic drug: levonorgestrel Take 1 device by intrauterine route.   polyethylene glycol 17 g packet Commonly known as: MIRALAX / GLYCOLAX Take 17 g by mouth daily as needed for severe constipation or moderate constipation.   sucralfate 1 g tablet Commonly known as: Carafate Take 1 tablet (1 g total) by mouth 4 (four) times daily -  with meals and at bedtime.   Vitamin D3 1.25 MG (50000 UT) Caps Take 1 weekly for 12 weeks What changed:  how much to take how to take this when to take this additional instructions        Allergies: No Known Allergies  Past Medical History, Surgical history, Social history, and Family History were reviewed and updated.  Review of Systems: All other 10 point review of systems is negative.   Physical Exam:  vitals were not taken for this visit.   Wt Readings from Last 3 Encounters:  08/02/22 263 lb (119.3 kg)  07/30/22 257 lb 12.8 oz (116.9 kg)  03/26/22 276 lb 4.8 oz (125.3 kg)    Ocular: Sclerae unicteric, pupils equal, round and reactive to light Ear-nose-throat: Oropharynx clear, dentition fair  Lymphatic: No cervical or supraclavicular adenopathy Lungs no rales or rhonchi, good excursion bilaterally Heart regular rate and rhythm, no murmur appreciated Abd soft, nontender, positive bowel sounds MSK no focal spinal tenderness, no joint edema Neuro: non-focal, well-oriented, appropriate affect Breasts: Deferred   Lab Results  Component Value Date   WBC 6.7 08/02/2022   HGB 11.1 (L) 08/02/2022   HCT 36.4 08/02/2022   MCV 75.1 (L) 08/02/2022   PLT 386 08/02/2022   Lab Results  Component Value Date   FERRITIN 2.7 (L) 07/12/2022   IRON 35 08/02/2022   TIBC 417 08/02/2022   UIBC 382 08/02/2022   IRONPCTSAT 8 (L) 08/02/2022    Lab Results  Component Value Date   RETICCTPCT 1.5 08/02/2022   RBC 4.85 08/02/2022   RBC 4.92 08/02/2022   No results found for: "KPAFRELGTCHN", "LAMBDASER", "KAPLAMBRATIO" No results found for: "IGGSERUM", "IGA", "IGMSERUM" No results found for: "TOTALPROTELP", "ALBUMINELP", "A1GS", "A2GS", "BETS", "BETA2SER", "GAMS", "MSPIKE", "SPEI"   Chemistry      Component Value Date/Time   NA 139 08/02/2022 1456   K 3.8 08/02/2022 1456   CL 107 08/02/2022 1456   CO2 24 08/02/2022 1456   BUN 9 08/02/2022 1456   CREATININE 0.77 08/02/2022 1456   CREATININE 0.70 12/13/2015 1104      Component Value Date/Time   CALCIUM 9.5 08/02/2022 1456   ALKPHOS 84 08/02/2022 1456   AST 14 (L) 08/02/2022 1456   ALT 12 08/02/2022 1456   BILITOT 0.2 (L) 08/02/2022 1456       Impression and Plan: Melanie Stout is a very pleasant 37 yo African American female with history of iron deficiency anemia secondary to heavy cycle as well as the alpha thalassemia minor trait.  Iron studies are pending. We will replace if needed.  Follow-up in 4 months.   Eileen Stanford, NP 4/30/20241:34 PM

## 2022-10-04 ENCOUNTER — Telehealth: Payer: Self-pay | Admitting: *Deleted

## 2022-10-04 DIAGNOSIS — L732 Hidradenitis suppurativa: Secondary | ICD-10-CM | POA: Insufficient documentation

## 2022-10-04 NOTE — Telephone Encounter (Signed)
Per 10/01/22 los called patient and lvm of upcoming appointment - requested callback to confirm.

## 2022-10-04 NOTE — Patient Instructions (Incomplete)
It was good to see you again today- I am sorry you have not been feeling well You are pregnant, we are not quite sure how many weeks you may be.  Please call your gynecologist, Dr. Langston Masker right away and get an appointment.  If you cannot get in for a little while I am glad to order an ultrasound for you so we can try to get your dates  It is hard to know if your symptoms are all due to pregnancy or if something else could be going on as well.  I will get some more blood work for you today.  Please let me know when you find out your weeks of gestation

## 2022-10-04 NOTE — Progress Notes (Unsigned)
[facility] at [facility] [address] [place], [place] [address] [phone]  Date:  10/07/2022   Name:  Lorianne Terhune   DOB:  02/08/1986   MRN:  [id number]  PCP:  Pearline Cables, MD    Chief Complaint: No chief complaint on file.   History of Present Illness:  Melanie Stout is a 37 y.o. very pleasant female patient who presents with the following:  Pt seen today for follow-up/ concern of dizziness  Most recent visit with myself was a virtual visit in February when she was feeling lightheaded She ended up being diagnosed with iron deficiency anemia and has been seeing hematology for symptomatic anemia, low ferritin Iron deficiency is thought secondary to menorrhagia-she had an IUD placed after her last delivery, unfortunately she continued to bleed with the IUD in place She does use Humira for hidradenitis suppurativa  She was seen by hematology on April 30 for follow-up, she continued to have concern of fatigue, shortness of breath, palpitations, a spinning sensation Lab work at that time showed resolution of anemia and satisfactory ferritin at 52  Pap screening Patient Active Problem List   Diagnosis Date Noted   IDA (iron deficiency anemia) 08/02/2022   Pregnancy 03/26/2022   S/P cesarean section 03/26/2022   COVID-19 virus infection 02/13/2021   Obesity, unspecified obesity severity, unspecified obesity type 08/01/2016   Tobacco use disorder 06/05/2016    Past Medical History:  Diagnosis Date   Abnormal Pap smear    Blood transfusion without reported diagnosis 01/05/2013   Headache(784.0)    Hx of varicella    Hyperlipidemia     Past Surgical History:  Procedure Laterality Date   CESAREAN SECTION N/A 03/26/2022   Procedure: CESAREAN SECTION;  Surgeon: Mitchel Honour, DO;  Location: [facility] LD [facility];  Service: Obstetrics;  Laterality: N/A;   NOSE SURGERY      Social History   Tobacco Use    Smoking status: Former    Packs/day: .5    Types: Cigarettes    Quit date: 07/24/2016    Years since quitting: 6.2   Smokeless tobacco: Never  Vaping Use   Vaping Use: Never used  Substance Use Topics   Alcohol use: Not Currently    Comment: occ   Drug use: No    Family History  Problem Relation Age of Onset   Hypertension Mother    Parkinson's disease Father    Diabetes Maternal Aunt     No Known Allergies  Medication list has been reviewed and updated.  Current Outpatient Medications on File Prior to Visit  Medication Sig Dispense Refill   acetaminophen (TYLENOL) 500 MG tablet Take 500 mg by mouth every 6 (six) hours as needed. (Patient not taking: Reported on 10/01/2022)     acetaminophen (TYLENOL) 500 MG tablet Take 2 tablets (1,000 mg total) by mouth every 6 (six) hours. (Patient not taking: Reported on 10/01/2022) 30 tablet 0   Adalimumab (HUMIRA PEN) 80 MG/0.8ML PNKT Inject 80 mg into the skin every 14 (fourteen) days.     Cholecalciferol (VITAMIN D3) 1.25 MG (50000 UT) CAPS Take 1 weekly for 12 weeks (Patient taking differently: Take 50,000 Units by mouth once a week.) 12 capsule 0   doxycycline (VIBRA-TABS) 100 MG tablet Take 1 tablet (100 mg total) by mouth 2 (two) times daily. (Patient not taking: Reported on 10/01/2022) 14 tablet 0   ferrous sulfate 325 (65 FE) MG tablet Take  1 tablet (325 mg total) by mouth daily with breakfast. (Patient not taking: Reported on 10/01/2022) 30 tablet 3   folic acid (FOLVITE) 1 MG tablet Take 1 tablet (1 mg total) by mouth daily. (Patient not taking: Reported on 10/01/2022) 30 tablet 11   ibuprofen (ADVIL) 600 MG tablet Take 1 tablet (600 mg total) by mouth every 6 (six) hours. 30 tablet 0   levonorgestrel (MIRENA, 52 MG,) 20 MCG/DAY IUD Take 1 device by intrauterine route.     MELATONIN PO Take by mouth as needed. Unsure dose     polyethylene glycol (MIRALAX / GLYCOLAX) 17 g packet Take 17 g by mouth daily as needed for severe  constipation or moderate constipation. 14 each 0   sucralfate (CARAFATE) 1 g tablet Take 1 tablet (1 g total) by mouth 4 (four) times daily -  with meals and at bedtime. 120 tablet 0   No current facility-administered medications on file prior to visit.    Review of Systems:  As per HPI- otherwise negative.   Physical Examination: There were no vitals filed for this visit. There were no vitals filed for this visit. There is no height or weight on file to calculate BMI. Ideal Body Weight:    GEN: no acute distress. HEENT: Atraumatic, Normocephalic.  Ears and Nose: No external deformity. CV: RRR, No M/G/R. No JVD. No thrill. No extra heart sounds. PULM: CTA B, no wheezes, crackles, rhonchi. No retractions. No resp. distress. No accessory muscle use. ABD: S, NT, ND, +BS. No rebound. No HSM. EXTR: No c/c/e PSYCH: Normally interactive. Conversant.    Assessment and Plan: ***  Signed Abbe Amsterdam, MD

## 2022-10-07 ENCOUNTER — Encounter: Payer: Self-pay | Admitting: Family

## 2022-10-07 ENCOUNTER — Ambulatory Visit (INDEPENDENT_AMBULATORY_CARE_PROVIDER_SITE_OTHER): Payer: 59 | Admitting: Family Medicine

## 2022-10-07 ENCOUNTER — Encounter: Payer: Self-pay | Admitting: Family Medicine

## 2022-10-07 VITALS — BP 110/72 | HR 71 | Temp 98.1°F | Resp 18 | Ht 66.0 in | Wt 265.0 lb

## 2022-10-07 DIAGNOSIS — Z1329 Encounter for screening for other suspected endocrine disorder: Secondary | ICD-10-CM | POA: Diagnosis not present

## 2022-10-07 DIAGNOSIS — L732 Hidradenitis suppurativa: Secondary | ICD-10-CM

## 2022-10-07 DIAGNOSIS — Z131 Encounter for screening for diabetes mellitus: Secondary | ICD-10-CM

## 2022-10-07 DIAGNOSIS — R112 Nausea with vomiting, unspecified: Secondary | ICD-10-CM

## 2022-10-07 DIAGNOSIS — Z3490 Encounter for supervision of normal pregnancy, unspecified, unspecified trimester: Secondary | ICD-10-CM

## 2022-10-07 DIAGNOSIS — F4323 Adjustment disorder with mixed anxiety and depressed mood: Secondary | ICD-10-CM

## 2022-10-07 DIAGNOSIS — R42 Dizziness and giddiness: Secondary | ICD-10-CM

## 2022-10-07 LAB — COMPREHENSIVE METABOLIC PANEL
ALT: 9 U/L (ref 0–35)
AST: 13 U/L (ref 0–37)
Albumin: 4 g/dL (ref 3.5–5.2)
Alkaline Phosphatase: 59 U/L (ref 39–117)
BUN: 8 mg/dL (ref 6–23)
CO2: 18 mEq/L — ABNORMAL LOW (ref 19–32)
Calcium: 9 mg/dL (ref 8.4–10.5)
Chloride: 105 mEq/L (ref 96–112)
Creatinine, Ser: 0.66 mg/dL (ref 0.40–1.20)
GFR: 111.45 mL/min (ref >60.00)
Glucose, Bld: 80 mg/dL (ref 70–99)
Potassium: 4.2 mEq/L (ref 3.5–5.1)
Sodium: 137 mEq/L (ref 135–145)
Total Bilirubin: 0.4 mg/dL (ref 0.2–1.2)
Total Protein: 6.5 g/dL (ref 6.0–8.3)

## 2022-10-07 LAB — TSH: TSH: 1.07 u[IU]/mL (ref 0.35–5.50)

## 2022-10-07 LAB — POCT URINE PREGNANCY: Preg Test, Ur: POSITIVE — AB

## 2022-10-07 LAB — HEMOGLOBIN A1C: Hgb A1c MFr Bld: 5 % (ref 4.6–6.5)

## 2022-10-08 NOTE — Addendum Note (Signed)
Addended by: Abbe Amsterdam C on: 10/08/2022 02:44 PM   Modules accepted: Orders

## 2022-10-14 MED ORDER — SERTRALINE HCL 100 MG PO TABS
100.0000 mg | ORAL_TABLET | Freq: Every day | ORAL | 1 refills | Status: DC
Start: 2022-10-14 — End: 2022-10-15
  Filled 2022-10-14: qty 30, 30d supply, fill #0

## 2022-10-14 NOTE — Addendum Note (Signed)
Addended by: Abbe Amsterdam C on: 10/14/2022 08:03 PM   Modules accepted: Orders

## 2022-10-15 ENCOUNTER — Other Ambulatory Visit (HOSPITAL_BASED_OUTPATIENT_CLINIC_OR_DEPARTMENT_OTHER): Payer: Self-pay

## 2022-10-15 ENCOUNTER — Other Ambulatory Visit: Payer: Self-pay

## 2022-10-15 MED ORDER — SERTRALINE HCL 100 MG PO TABS: 100.0000 mg | ORAL_TABLET | Freq: Every day | ORAL | 1 refills | Status: DC

## 2022-10-15 NOTE — Addendum Note (Signed)
Addended by: Abbe Amsterdam C on: 10/15/2022 12:20 PM   Modules accepted: Orders

## 2022-10-18 ENCOUNTER — Encounter: Payer: Self-pay | Admitting: Family

## 2022-10-22 ENCOUNTER — Ambulatory Visit (HOSPITAL_COMMUNITY): Payer: Medicaid Other

## 2022-10-24 ENCOUNTER — Ambulatory Visit (HOSPITAL_COMMUNITY)
Admission: RE | Admit: 2022-10-24 | Discharge: 2022-10-24 | Disposition: A | Discharge status: Home / Self Care | Payer: 59 | Source: Ambulatory Visit | Attending: Family Medicine | Admitting: Family Medicine

## 2022-10-24 DIAGNOSIS — Z3490 Encounter for supervision of normal pregnancy, unspecified, unspecified trimester: Secondary | ICD-10-CM | POA: Insufficient documentation

## 2022-10-24 DIAGNOSIS — Z3A09 9 weeks gestation of pregnancy: Secondary | ICD-10-CM | POA: Insufficient documentation

## 2022-10-24 DIAGNOSIS — Z3687 Encounter for antenatal screening for uncertain dates: Secondary | ICD-10-CM | POA: Diagnosis present

## 2022-10-25 ENCOUNTER — Encounter: Payer: Self-pay | Admitting: Family Medicine

## 2022-10-25 DIAGNOSIS — F4323 Adjustment disorder with mixed anxiety and depressed mood: Secondary | ICD-10-CM

## 2022-10-30 ENCOUNTER — Encounter: Payer: Self-pay | Admitting: Family

## 2022-10-30 ENCOUNTER — Other Ambulatory Visit (HOSPITAL_BASED_OUTPATIENT_CLINIC_OR_DEPARTMENT_OTHER): Payer: Self-pay

## 2022-10-30 MED ORDER — SERTRALINE HCL 50 MG PO TABS
50.0000 mg | ORAL_TABLET | Freq: Every day | ORAL | 3 refills | Status: DC
Start: 2022-10-30 — End: 2023-01-29
  Filled 2022-10-30: qty 30, 30d supply, fill #0

## 2022-10-30 NOTE — Telephone Encounter (Signed)
Please advise in regard to Zoloft 100 mg.

## 2022-11-13 ENCOUNTER — Other Ambulatory Visit (HOSPITAL_BASED_OUTPATIENT_CLINIC_OR_DEPARTMENT_OTHER): Payer: Self-pay

## 2022-11-18 ENCOUNTER — Encounter: Payer: Self-pay | Admitting: Family Medicine

## 2022-11-26 LAB — OB RESULTS CONSOLE RPR: RPR: NONREACTIVE

## 2022-11-26 LAB — HEPATITIS C ANTIBODY: HCV Ab: NEGATIVE

## 2022-11-26 LAB — OB RESULTS CONSOLE ANTIBODY SCREEN: Antibody Screen: NEGATIVE

## 2022-11-26 LAB — OB RESULTS CONSOLE HEPATITIS B SURFACE ANTIGEN: Hepatitis B Surface Ag: NEGATIVE

## 2022-11-26 LAB — OB RESULTS CONSOLE HIV ANTIBODY (ROUTINE TESTING): HIV: NONREACTIVE

## 2022-11-26 LAB — OB RESULTS CONSOLE RUBELLA ANTIBODY, IGM: Rubella: IMMUNE

## 2022-12-02 LAB — HM PAP SMEAR
HM Pap smear: NORMAL
HPV, high-risk: NEGATIVE

## 2022-12-02 LAB — RESULTS CONSOLE HPV: CHL HPV: NEGATIVE

## 2022-12-02 LAB — OB RESULTS CONSOLE GC/CHLAMYDIA
Chlamydia: NEGATIVE
Neisseria Gonorrhea: NEGATIVE

## 2022-12-13 ENCOUNTER — Other Ambulatory Visit: Payer: Self-pay | Admitting: Obstetrics & Gynecology

## 2022-12-13 DIAGNOSIS — N6310 Unspecified lump in the right breast, unspecified quadrant: Secondary | ICD-10-CM

## 2022-12-19 ENCOUNTER — Ambulatory Visit
Admission: RE | Admit: 2022-12-19 | Discharge: 2022-12-19 | Disposition: A | Discharge status: Home / Self Care | Payer: Medicaid Other | Source: Ambulatory Visit | Attending: Obstetrics & Gynecology | Admitting: Obstetrics & Gynecology

## 2022-12-19 ENCOUNTER — Encounter: Payer: Self-pay | Admitting: Family

## 2022-12-19 ENCOUNTER — Ambulatory Visit
Admission: RE | Admit: 2022-12-19 | Discharge: 2022-12-19 | Disposition: A | Discharge status: Home / Self Care | Payer: 59 | Source: Ambulatory Visit | Attending: Obstetrics & Gynecology | Admitting: Obstetrics & Gynecology

## 2022-12-19 DIAGNOSIS — N6310 Unspecified lump in the right breast, unspecified quadrant: Secondary | ICD-10-CM

## 2023-01-16 ENCOUNTER — Encounter: Payer: Self-pay | Admitting: Family

## 2023-01-20 ENCOUNTER — Telehealth: Payer: 59

## 2023-01-20 ENCOUNTER — Encounter: Payer: Self-pay | Admitting: Family Medicine

## 2023-01-21 ENCOUNTER — Encounter: Payer: Self-pay | Admitting: Family

## 2023-01-28 ENCOUNTER — Encounter: Payer: Self-pay | Admitting: Family

## 2023-01-28 NOTE — Progress Notes (Unsigned)
[facility] at [facility] [address] [place], [place] [address] [phone]  Date:  01/29/2023   Name:  Melanie Stout   DOB:  [birth year]   MRN:  [id number]  PCP:  Pearline Cables, MD    Chief Complaint: No chief complaint on file.   History of Present Illness:  Melanie Stout is a 37 y.o. very pleasant female patient who presents with the following:  Patient seen virtually today for concern of anxiety  Patient location is home, location is office.  Patient identity confirmed with 2 factors, she gives consent for virtual visit today.  The patient and myself are present on the call today Most recent visit with myself was in May at which point we discovered she was pregnant  She has history of iron deficiency anemia, thought due to menorrhagia.  She is also using Humira for hidradenitis suppurativa Patient Active Problem List   Diagnosis Date Noted   Hidradenitis suppurativa 10/04/2022   IDA (iron deficiency anemia) 08/02/2022   S/P cesarean section 03/26/2022   Obesity, unspecified obesity severity, unspecified obesity type 08/01/2016   Tobacco use disorder 06/05/2016    Past Medical History:  Diagnosis Date   Abnormal Pap smear    Blood transfusion without reported diagnosis 01/05/2013   Headache(784.0)    Hx of varicella    Hyperlipidemia     Past Surgical History:  Procedure Laterality Date   CESAREAN SECTION N/A 03/26/2022   Procedure: CESAREAN SECTION;  Surgeon: Mitchel Honour, DO;  Location: [facility] LD [facility]S;  Service: Obstetrics;  Laterality: N/A;   NOSE SURGERY      Social History   Tobacco Use   Smoking status: Former    Current packs/day: 0.00    Types: Cigarettes    Quit date: 07/24/2016    Years since quitting: 6.5   Smokeless tobacco: Never  Vaping Use   Vaping status: Never Used  Substance Use Topics   Alcohol use: Not Currently    Comment: occ   Drug use: No    Family  History  Problem Relation Age of Onset   Hypertension Mother    Parkinson's disease Father    Diabetes Maternal Aunt     No Known Allergies  Medication list has been reviewed and updated.  Current Outpatient Medications on File Prior to Visit  Medication Sig Dispense Refill   Adalimumab (HUMIRA PEN) 80 MG/0.8ML PNKT Inject 80 mg into the skin every 14 (fourteen) days.     Cholecalciferol (VITAMIN D3) 1.25 MG (50000 UT) CAPS Take 1 weekly for 12 weeks (Patient taking differently: Take 50,000 Units by mouth once a week.) 12 capsule 0   ferrous sulfate 325 (65 FE) MG tablet Take 1 tablet (325 mg total) by mouth daily with breakfast. 30 tablet 3   folic acid (FOLVITE) 1 MG tablet Take 1 tablet (1 mg total) by mouth daily. 30 tablet 11   ibuprofen (ADVIL) 600 MG tablet Take 1 tablet (600 mg total) by mouth every 6 (six) hours. 30 tablet 0   levonorgestrel (MIRENA, 52 MG,) 20 MCG/DAY IUD Take 1 device by intrauterine route.     MELATONIN PO Take by mouth as needed. Unsure dose     polyethylene glycol (MIRALAX / GLYCOLAX) 17 g packet Take 17 g by mouth daily as needed for severe constipation or moderate constipation. 14 each 0   sertraline (ZOLOFT) 50 MG tablet Take 1 tablet (50 mg total) by  mouth daily. 90 tablet 3   sucralfate (CARAFATE) 1 g tablet Take 1 tablet (1 g total) by mouth 4 (four) times daily -  with meals and at bedtime. 120 tablet 0   No current facility-administered medications on file prior to visit.    Review of Systems:  As per HPI- otherwise negative.   Physical Examination: There were no vitals filed for this visit. There were no vitals filed for this visit. There is no height or weight on file to calculate BMI. Ideal Body Weight:     Assessment and Plan: ***  Signed Abbe Amsterdam, MD

## 2023-01-29 ENCOUNTER — Telehealth (INDEPENDENT_AMBULATORY_CARE_PROVIDER_SITE_OTHER): Payer: 59 | Admitting: Family Medicine

## 2023-01-29 DIAGNOSIS — F4323 Adjustment disorder with mixed anxiety and depressed mood: Secondary | ICD-10-CM

## 2023-01-29 MED ORDER — SERTRALINE HCL 25 MG PO TABS
75.0000 mg | ORAL_TABLET | Freq: Every day | ORAL | 3 refills | Status: DC
Start: 2023-01-29 — End: 2023-05-16

## 2023-01-31 ENCOUNTER — Inpatient Hospital Stay: Payer: 59

## 2023-01-31 ENCOUNTER — Inpatient Hospital Stay: Payer: 59 | Admitting: Family

## 2023-02-04 NOTE — Telephone Encounter (Signed)
[place] with [employer] called to ask if there were any other measures that could be taken to help accommodate the pts "prolonged sitting?"  Like a sit/stand desk that she could use at home?  We can call her back tomorrow once Ive spoken with you. [phone].

## 2023-02-10 NOTE — Telephone Encounter (Signed)
Called [place] back to clarify her breaks- she can get up to 2, 15 minute breaks per 4 hour day Per [place] all is taken care of now

## 2023-03-11 ENCOUNTER — Encounter: Payer: Self-pay | Admitting: Family Medicine

## 2023-03-11 ENCOUNTER — Ambulatory Visit (INDEPENDENT_AMBULATORY_CARE_PROVIDER_SITE_OTHER): Payer: 59 | Admitting: Family Medicine

## 2023-03-11 VITALS — BP 128/74 | HR 78 | Temp 98.3°F | Ht 66.0 in | Wt 268.0 lb

## 2023-03-11 DIAGNOSIS — J0140 Acute pansinusitis, unspecified: Secondary | ICD-10-CM | POA: Diagnosis not present

## 2023-03-11 MED ORDER — HYDROCOD POLI-CHLORPHE POLI ER 10-8 MG/5ML PO SUER
5.0000 mL | Freq: Two times a day (BID) | ORAL | 0 refills | Status: AC | PRN
Start: 2023-03-11 — End: 2023-03-16

## 2023-03-11 MED ORDER — AMOXICILLIN-POT CLAVULANATE 875-125 MG PO TABS
1.0000 | ORAL_TABLET | Freq: Two times a day (BID) | ORAL | 0 refills | Status: DC
Start: 2023-03-11 — End: 2023-04-03

## 2023-03-11 MED ORDER — BENZONATATE 200 MG PO CAPS
200.0000 mg | ORAL_CAPSULE | Freq: Two times a day (BID) | ORAL | 0 refills | Status: DC | PRN
Start: 2023-03-11 — End: 2023-04-03

## 2023-03-11 NOTE — Progress Notes (Signed)
Acute Office Visit  Subjective:     Patient ID: Melanie Stout, female    DOB: December 23, 1985, 37 y.o.   MRN: [id number]  Chief Complaint  Patient presents with   Cough    Cough, sore throat, no fever- 2x weeks OTC- tylenol-     Cough   Patient is in today for URI symptoms.    Discussed the use of AI scribe software for clinical note transcription with the patient, who gave verbal consent to proceed.  History of Present Illness   The patient, with a history of frequent sinus infections, presents with a two-week history of persistent upper respiratory symptoms. The primary complaints are a severe sore throat and a productive cough, both of which are worse in the morning. The patient also reports rhinorrhea, but this is not as bothersome as the throat and cough symptoms. The cough is described as both dry and productive, with initial yellow sputum that has since lightened in color. The patient denies fever, chills, and body aches but does report occasional headaches. The cough is particularly troublesome at night, causing significant sleep disturbance. The patient also notes some wheezing, predominantly at night. She has also been feeling pressure to ethmoid and maxillary sinuses, worse in the morning.   The patient has been using Flonase and has tried Theraflu for the sore throat, which provides temporary relief. They also attempted Mucinex maximum strength, but discontinued it due to perceived lack of effect. The patient denies any recent exposure to sick contacts and is currently employed. The last sinus infection was approximately eight months ago, for which they were treated with Augmentin.           All review of systems negative except what is listed in the HPI      Objective:    BP 128/74   Pulse 78   Temp 98.3 F (36.8 C)   Ht 5\' 6"  (1.676 m)   Wt 268 lb (121.6 kg)   SpO2 99%   BMI 43.26 kg/m    Physical Exam Vitals reviewed.  Constitutional:       General: She is not in acute distress.    Appearance: Normal appearance. She is obese. She is not ill-appearing.  HENT:     Right Ear: Tympanic membrane normal.     Left Ear: Tympanic membrane normal.     Nose: Congestion present.     Right Turbinates: Swollen.     Left Turbinates: Swollen.  Cardiovascular:     Rate and Rhythm: Normal rate and regular rhythm.     Heart sounds: Normal heart sounds.  Pulmonary:     Effort: Pulmonary effort is normal.     Breath sounds: Normal breath sounds. No wheezing, rhonchi or rales.  Skin:    General: Skin is warm and dry.  Neurological:     Mental Status: She is alert and oriented to person, place, and time.  Psychiatric:        Mood and Affect: Mood normal.        Behavior: Behavior normal.        Thought Content: Thought content normal.        Judgment: Judgment normal.     No results found for any visits on 03/11/23.      Assessment & Plan:   Problem List Items Addressed This Visit   None Visit Diagnoses     Acute non-recurrent pansinusitis    -  Primary   Relevant Medications   chlorpheniramine-HYDROcodone (TUSSIONEX)  10-8 MG/5ML   amoxicillin-clavulanate (AUGMENTIN) 875-125 MG tablet   benzonatate (TESSALON) 200 MG capsule      Persistent symptoms for two weeks including sore throat, cough, and nasal congestion. No fever or body aches. Yellow sputum initially, now lighter. Nocturnal cough and wheezing. History of sinus infections. Throat inflammation and sinus congestion noted on examination. -Start Augmentin twice daily for 10 days. -Continue Flonase daily. -Resume Mucinex twice daily. -Add Tessalon Perles for daytime cough. -Provide Tussionex for nighttime use. -Continue supportive measures including rest, hydration, humidifier use, steam showers, warm compresses to sinuses, warm liquids with lemon and honey, and over-the-counter cough, cold, and analgesics as needed.  -Return for follow-up if no improvement within a few  days of starting antibiotics.        Meds ordered this encounter  Medications   chlorpheniramine-HYDROcodone (TUSSIONEX) 10-8 MG/5ML    Sig: Take 5 mLs by mouth every 12 (twelve) hours as needed for up to 5 days.    Dispense:  50 mL    Refill:  0    Order Specific Question:   Supervising Provider    Answer:   Danise Edge A [[id number]]   amoxicillin-clavulanate (AUGMENTIN) 875-125 MG tablet    Sig: Take 1 tablet by mouth 2 (two) times daily.    Dispense:  20 tablet    Refill:  0    Order Specific Question:   Supervising Provider    Answer:   Danise Edge A [[id number]]   benzonatate (TESSALON) 200 MG capsule    Sig: Take 1 capsule (200 mg total) by mouth 2 (two) times daily as needed for cough.    Dispense:  20 capsule    Refill:  0    Order Specific Question:   Supervising Provider    Answer:   Danise Edge A [[id number]]    Return if symptoms worsen or fail to improve.  Clayborne Dana, NP

## 2023-03-11 NOTE — Patient Instructions (Signed)
The following information is provided as a general resource for ADULT patients only and does NOT take into account PREGNANCY, ALLERGIES, LIVER CONDITIONS, KIDNEY CONDITIONS, GASTROINTESTINAL CONDITIONS, OR PRESCRIPTION MEDICATION INTERACTIONS. Please be sure to ask your provider if the following are safe to take with your specific medical history, conditions, or current medication regimen if you are unsure.   Adult Basic Symptom Management for Sinusitis  Congestion: Guaifenesin (Mucinex)- follow directions on packaging with a maximum dose of 2400mg in a 24 hour period.  Pain/Fever: Ibuprofen 200mg - 400mg every 4-6 hours as needed (MAX 1200mg in a 24 hour period) Pain/Fever: Tylenol 500mg -1000mg every 6-8 hours as needed (MAX 3000mg in a 24 hour period)  Cough: Dextromethorphan (Delsym)- follow directions on packing with a maximum dose of 120mg in a 24 hour period.  Nasal Stuffiness: Saline nasal spray and/or Nettie Pot with sterile saline solution  Runny Nose: Fluticasone nasal spray (Flonase) OR Mometasone nasal spray (Nasonex) OR Triamcinolone Acetonide nasal spray (Nasacort)- follow directions on the packaging  Pain/Pressure: Warm washcloth to the face  Sore Throat: Warm salt water gargles  If you have allergies, you may also consider taking an oral antihistamine (like Zyrtec or Claritin) as these may also help with your symptoms.  **Many medications will have more than one ingredient, be sure you are reading the packaging carefully and not taking more than one dose of the same kind of medication at the same time or too close together. It is OK to use formulas that have all of the ingredients you want, but do not take them in a combined medication and as separate dose too close together. If you have any questions, the pharmacist will be happy to help you decide what is safe.    

## 2023-03-13 ENCOUNTER — Other Ambulatory Visit: Payer: Self-pay

## 2023-03-13 ENCOUNTER — Encounter: Payer: Self-pay | Admitting: Medical Oncology

## 2023-03-13 ENCOUNTER — Inpatient Hospital Stay (HOSPITAL_BASED_OUTPATIENT_CLINIC_OR_DEPARTMENT_OTHER): Payer: 59 | Admitting: Medical Oncology

## 2023-03-13 ENCOUNTER — Inpatient Hospital Stay: Payer: 59 | Attending: Family Medicine

## 2023-03-13 DIAGNOSIS — D500 Iron deficiency anemia secondary to blood loss (chronic): Secondary | ICD-10-CM | POA: Insufficient documentation

## 2023-03-13 DIAGNOSIS — N920 Excessive and frequent menstruation with regular cycle: Secondary | ICD-10-CM | POA: Insufficient documentation

## 2023-03-13 LAB — CBC WITH DIFFERENTIAL (CANCER CENTER ONLY)
Abs Immature Granulocytes: 0.28 10*3/uL — ABNORMAL HIGH (ref 0.00–0.07)
Basophils Absolute: 0 10*3/uL (ref 0.0–0.1)
Basophils Relative: 1 %
Eosinophils Absolute: 0.2 10*3/uL (ref 0.0–0.5)
Eosinophils Relative: 3 %
HCT: 35.3 % — ABNORMAL LOW (ref 36.0–46.0)
Hemoglobin: 12 g/dL (ref 12.0–15.0)
Immature Granulocytes: 4 %
Lymphocytes Relative: 31 %
Lymphs Abs: 2.3 10*3/uL (ref 0.7–4.0)
MCH: 28.3 pg (ref 26.0–34.0)
MCHC: 34 g/dL (ref 30.0–36.0)
MCV: 83.3 fL (ref 80.0–100.0)
Monocytes Absolute: 0.5 10*3/uL (ref 0.1–1.0)
Monocytes Relative: 7 %
Neutro Abs: 4.1 10*3/uL (ref 1.7–7.7)
Neutrophils Relative %: 54 %
Platelet Count: 129 10*3/uL — ABNORMAL LOW (ref 150–400)
RBC: 4.24 MIL/uL (ref 3.87–5.11)
RDW: 13.2 % (ref 11.5–15.5)
WBC Count: 7.4 10*3/uL (ref 4.0–10.5)
nRBC: 0 % (ref 0.0–0.2)

## 2023-03-13 LAB — RETICULOCYTES
Immature Retic Fract: 13.3 % (ref 2.3–15.9)
RBC.: 4.25 MIL/uL (ref 3.87–5.11)
Retic Count, Absolute: 68 10*3/uL (ref 19.0–186.0)
Retic Ct Pct: 1.6 % (ref 0.4–3.1)

## 2023-03-13 LAB — FERRITIN: Ferritin: 10 ng/mL — ABNORMAL LOW (ref 11–307)

## 2023-03-13 MED ORDER — FOLIC ACID 1 MG PO TABS
1.0000 mg | ORAL_TABLET | Freq: Every day | ORAL | 11 refills | Status: DC
Start: 2023-03-13 — End: 2024-03-23

## 2023-03-13 NOTE — Progress Notes (Signed)
Hematology and Oncology Follow Up Visit  Melanie Stout [id number] 1985/10/30 37 y.o. 03/17/2023   Principle Diagnosis:  Iron deficiency anemia  Alpha thalassemia minor trait  Current Therapy:   IV iron as indicated - oral iron on hold  Folic acid 1 mg daily PO- she is not currently taking this.    Interim History:  Melanie Stout is here today for follow-up.   Today she reports that she has been well since her last visit.   No blood loss noted. No bruising or petechiae.  No fever, chills, n/v, cough, rash, chest pain, abdominal pain or changes in bladder habits.  No swelling, numbness or tingling in her extremities at this time.  No falls or syncope.  Appetite and hydration are good.  Wt Readings from Last 3 Encounters:  03/13/23 268 lb (121.6 kg)  03/11/23 268 lb (121.6 kg)  10/07/22 265 lb (120.2 kg)     ECOG Performance Status: 1 - Symptomatic but completely ambulatory  Medications:  Allergies as of 03/13/2023   No Known Allergies      Medication List        Accurate as of March 13, 2023 11:59 PM. If you have any questions, ask your nurse or doctor.          amoxicillin-clavulanate 875-125 MG tablet Commonly known as: AUGMENTIN Take 1 tablet by mouth 2 (two) times daily.   benzonatate 200 MG capsule Commonly known as: TESSALON Take 1 capsule (200 mg total) by mouth 2 (two) times daily as needed for cough.   chlorpheniramine-HYDROcodone 10-8 MG/5ML Commonly known as: TUSSIONEX Take 5 mLs by mouth every 12 (twelve) hours as needed for up to 5 days.   ferrous sulfate 325 (65 FE) MG tablet Take 1 tablet (325 mg total) by mouth daily with breakfast.   folic acid 1 MG tablet Commonly known as: FOLVITE Take 1 tablet (1 mg total) by mouth daily.   Humira (2 Pen) 80 MG/0.8ML pen Generic drug: adalimumab Inject 80 mg into the skin every 14 (fourteen) days.   ibuprofen 600 MG tablet Commonly known as: ADVIL Take 1 tablet (600 mg  total) by mouth every 6 (six) hours.   MELATONIN PO Take by mouth as needed. Unsure dose   polyethylene glycol 17 g packet Commonly known as: MIRALAX / GLYCOLAX Take 17 g by mouth daily as needed for severe constipation or moderate constipation.   PRENATAL + DHA PO   sertraline 25 MG tablet Commonly known as: ZOLOFT Take 3 tablets (75 mg total) by mouth daily.   sucralfate 1 g tablet Commonly known as: Carafate Take 1 tablet (1 g total) by mouth 4 (four) times daily -  with meals and at bedtime.   Vitamin D3 1.25 MG (50000 UT) Caps Take 1 weekly for 12 weeks        Allergies: No Known Allergies  Past Medical History, Surgical history, Social history, and Family History were reviewed and updated.  Review of Systems: All other 10 point review of systems is negative.   Physical Exam:  weight is 268 lb (121.6 kg). Her oral temperature is 98.2 F (36.8 C). Her blood pressure is 116/73 and her pulse is 84. Her respiration is 18 and oxygen saturation is 100%.   Wt Readings from Last 3 Encounters:  03/13/23 268 lb (121.6 kg)  03/11/23 268 lb (121.6 kg)  10/07/22 265 lb (120.2 kg)    Ocular: Sclerae unicteric, pupils equal, round and reactive to light Ear-nose-throat: Oropharynx clear,  dentition fair Lymphatic: No cervical or supraclavicular adenopathy Lungs no rales or rhonchi, good excursion bilaterally Heart regular rate and rhythm, no murmur appreciated Abd soft, nontender, positive bowel sounds MSK no focal spinal tenderness, no joint edema Neuro: non-focal, well-oriented, appropriate affect   Lab Results  Component Value Date   WBC 7.4 03/13/2023   HGB 12.0 03/13/2023   HCT 35.3 (L) 03/13/2023   MCV 83.3 03/13/2023   PLT 129 (L) 03/13/2023   Lab Results  Component Value Date   FERRITIN 10 (L) 03/13/2023   IRON 86 03/13/2023   TIBC 476 (H) 03/13/2023   UIBC 390 03/13/2023   IRONPCTSAT 18 03/13/2023   Lab Results  Component Value Date   RETICCTPCT 1.6  03/13/2023   RBC 4.25 03/13/2023   RBC 4.24 03/13/2023   No results found for: "KPAFRELGTCHN", "LAMBDASER", "KAPLAMBRATIO" No results found for: "IGGSERUM", "IGA", "IGMSERUM" No results found for: "TOTALPROTELP", "ALBUMINELP", "A1GS", "A2GS", "BETS", "BETA2SER", "GAMS", "MSPIKE", "SPEI"   Chemistry      Component Value Date/Time   NA 137 10/07/2022 1116   K 4.2 10/07/2022 1116   CL 105 10/07/2022 1116   CO2 18 (L) 10/07/2022 1116   BUN 8 10/07/2022 1116   CREATININE 0.66 10/07/2022 1116   CREATININE 0.77 08/02/2022 1456   CREATININE 0.70 12/13/2015 1104      Component Value Date/Time   CALCIUM 9.0 10/07/2022 1116   ALKPHOS 59 10/07/2022 1116   AST 13 10/07/2022 1116   AST 14 (L) 08/02/2022 1456   ALT 9 10/07/2022 1116   ALT 12 08/02/2022 1456   BILITOT 0.4 10/07/2022 1116   BILITOT 0.2 (L) 08/02/2022 1456      Encounter Diagnosis  Name Primary?   Iron deficiency anemia due to chronic blood loss     Impression and Plan: Melanie Stout is a very pleasant 37 yo African American female with history of iron deficiency anemia secondary to heavy cycle as well as the alpha thalassemia minor trait.   Today her Hgb is 12. Iron studies are pending. We will replace if needed.  Follow-up in 4 months.   Melanie Chestnut, PA-C 10/14/20241:55 PM

## 2023-03-14 LAB — IRON AND IRON BINDING CAPACITY (CC-WL,HP ONLY)
Iron: 86 ug/dL (ref 28–170)
Saturation Ratios: 18 % (ref 10.4–31.8)
TIBC: 476 ug/dL — ABNORMAL HIGH (ref 250–450)
UIBC: 390 ug/dL (ref 148–442)

## 2023-03-17 ENCOUNTER — Encounter: Payer: Self-pay | Admitting: Family

## 2023-04-03 ENCOUNTER — Ambulatory Visit: Payer: 59 | Admitting: Family Medicine

## 2023-04-03 ENCOUNTER — Encounter: Payer: Self-pay | Admitting: Family Medicine

## 2023-04-03 ENCOUNTER — Other Ambulatory Visit: Payer: Self-pay | Admitting: Family Medicine

## 2023-04-03 VITALS — BP 132/70 | HR 97 | Resp 18 | Ht 66.0 in | Wt 272.8 lb

## 2023-04-03 DIAGNOSIS — M545 Low back pain, unspecified: Secondary | ICD-10-CM

## 2023-04-03 DIAGNOSIS — K219 Gastro-esophageal reflux disease without esophagitis: Secondary | ICD-10-CM

## 2023-04-03 MED ORDER — PANTOPRAZOLE SODIUM 40 MG PO TBEC: 40.0000 mg | DELAYED_RELEASE_TABLET | Freq: Every day | ORAL | 1 refills | Status: DC

## 2023-04-03 MED ORDER — CYCLOBENZAPRINE HCL 10 MG PO TABS
5.0000 mg | ORAL_TABLET | Freq: Two times a day (BID) | ORAL | 0 refills | Status: DC | PRN
Start: 2023-04-03 — End: 2023-07-15

## 2023-04-03 NOTE — Patient Instructions (Addendum)
It was good to see you again today, hope your back is feeling better soon Tylenol, heat are good choices for treatment.  If you need something more try the cyclobenzaprine muscle relaxer.  This will make you drowsy, please keep this in mind  I also refilled the pantoprazole you have used previously for reflux-please do double check with the pharmacist regarding use these medications during pregnancy.  Let me know if any other concerns or problems!

## 2023-04-03 NOTE — Progress Notes (Signed)
[facility] [address], [address] [place], [place] [address] [phone]  Date:  04/03/2023   Name:  Melanie Stout   DOB:  01-22-1986   MRN:  [id number]  PCP:  Pearline Cables, MD    Chief Complaint: Back Pain (Pt states she still has "that cough" from the beginning of the month /Back pain onset 04/02/2023 after picking up her son "toddler size" )   History of Present Illness:  Melanie Stout is a 37 y.o. very pleasant female patient who presents with the following:  Patient seen today with concern of back pain Most recent visit with myself was in May-at that time we incidentally discovered she was pregnant History of iron deficiency anemia, hidradenitis suppurativa controlled with Humira She is now [redacted] weeks along.  She is having a baby girl- pregnancy is going along smoothly -her due date is actually Christmas Day  She was seen at hematology on 10/10 to follow-up iron deficiency anemia secondary to alpha Thal trait and menorrhagia  She was seen by Ladona Ridgel earlier this month for cough -treated with Augmentin  She notes she picked up her 38 yo yesterday and had sudden onset of lower back pain- it was really painful She used a heat patch last night The pain radiates into her buttocks but not further down her legs. No numbness or weakness of her legs She is improved now but still having pain Not controled with tylenol She has no abdominal pain, no bleeding or leakage of fluid, normal fetal movement  Also, she notes severe GERD sx esp as her pregnancy continues Notes she uses pantoprazole during her previous pregnancies and wonders if she could use this again  BP Readings from Last 3 Encounters:  04/03/23 132/70  03/13/23 116/73  03/11/23 128/74     Patient Active Problem List   Diagnosis Date Noted   Hidradenitis suppurativa 10/04/2022   IDA (iron deficiency anemia) 08/02/2022   S/P cesarean  section 03/26/2022   Obesity, unspecified obesity severity, unspecified obesity type 08/01/2016   Tobacco use disorder 06/05/2016    Past Medical History:  Diagnosis Date   Abnormal Pap smear    Blood transfusion without reported diagnosis 01/05/2013   Headache(784.0)    Hx of varicella    Hyperlipidemia     Past Surgical History:  Procedure Laterality Date   CESAREAN SECTION N/A 03/26/2022   Procedure: CESAREAN SECTION;  Surgeon: Mitchel Honour, DO;  Location: [facility] LD ORS;  Service: Obstetrics;  Laterality: N/A;   NOSE SURGERY      Social History   Tobacco Use   Smoking status: Former    Current packs/day: 0.00    Types: Cigarettes    Quit date: 07/24/2016    Years since quitting: 6.6   Smokeless tobacco: Never  Vaping Use   Vaping status: Never Used  Substance Use Topics   Alcohol use: Not Currently    Comment: occ   Drug use: No    Family History  Problem Relation Age of Onset   Hypertension Mother    Parkinson's disease Father    Diabetes Maternal Aunt     No Known Allergies  Medication list has been reviewed and updated.  Current Outpatient Medications on File Prior to Visit  Medication Sig Dispense Refill   folic acid (FOLVITE) 1 MG tablet Take 1 tablet (1 mg total) by mouth daily. 30 tablet 11   ibuprofen (ADVIL) 600 MG tablet  Take 1 tablet (600 mg total) by mouth every 6 (six) hours. 30 tablet 0   MELATONIN PO Take by mouth as needed. Unsure dose     Prenatal-FeFum-FA-DHA w/o A (PRENATAL + DHA PO)      sertraline (ZOLOFT) 25 MG tablet Take 3 tablets (75 mg total) by mouth daily. 270 tablet 3   amoxicillin-clavulanate (AUGMENTIN) 875-125 MG tablet Take 1 tablet by mouth 2 (two) times daily. (Patient not taking: Reported on 04/03/2023) 20 tablet 0   benzonatate (TESSALON) 200 MG capsule Take 1 capsule (200 mg total) by mouth 2 (two) times daily as needed for cough. (Patient not taking: Reported on 04/03/2023) 20 capsule 0   No current facility-administered  medications on file prior to visit.    Review of Systems:  As per HPI- otherwise negative.   Physical Examination: Vitals:   04/03/23 1421  BP: 132/70  Pulse: 97  Resp: 18  SpO2: 97%   Vitals:   04/03/23 1421  Weight: 272 lb 12.8 oz (123.7 kg)  Height: 5\' 6"  (1.676 m)   Body mass index is 44.03 kg/m. Ideal Body Weight: Weight in (lb) to have BMI = 25: 154.6  GEN: no acute distress.  Looks well, third trimester pregnancy HEENT: Atraumatic, Normocephalic.  Ears and Nose: No external deformity. CV: RRR, No M/G/R. No JVD. No thrill. No extra heart sounds. PULM: CTA B, no wheezes, crackles, rhonchi. No retractions. No resp. distress. No accessory muscle use. ABD: S, NT, ND, +BS. No rebound. No HSM.  Benign, gravid belly EXTR: No c/c/e PSYCH: Normally interactive. Conversant.  She notes tenderness in the lumbar paraspinous musculature.  Normal thoracolumbar flexion and extension  Assessment and Plan: Acute midline low back pain without sciatica - Plan: cyclobenzaprine (FLEXERIL) 10 MG tablet  Gastroesophageal reflux disease, unspecified whether esophagitis present - Plan: pantoprazole (PROTONIX) 40 MG tablet  Patient seen today with 2 concerns.  She strained her back yesterday picking up her toddler and is having lower back pain.  Due to pregnancy she is avoiding NSAIDs.  Tylenol, heat is helping but not controlling her pain.  We discussed risk and benefits of cyclobenzaprine.  Pregnancy category B.  She would like to try this-caution regarding sedation, use as little as possible  I also refilled her pantoprazole for GERD.  Discussed with Pharm.D. here at the med center who noted this medication is generally considered safe for use during pregnancy.  She will let me know if not feeling better in the next several days, sooner if worse  Signed Abbe Amsterdam, MD

## 2023-04-17 ENCOUNTER — Encounter: Payer: Self-pay | Admitting: Family

## 2023-05-06 LAB — OB RESULTS CONSOLE GBS: GBS: POSITIVE

## 2023-05-16 ENCOUNTER — Encounter (HOSPITAL_COMMUNITY): Payer: Self-pay

## 2023-05-16 ENCOUNTER — Encounter (HOSPITAL_COMMUNITY): Payer: Self-pay | Admitting: *Deleted

## 2023-05-16 NOTE — Patient Instructions (Addendum)
Melanie Stout  05/16/2023   Your procedure is scheduled on:  05/22/2023  Arrive at 0530 at Entrance C on [address] at [facility]  and [employer]. You are invited to use the FREE valet parking or use the Visitor's parking deck.  Pick up the phone at the desk and dial [phone].  Call this number if you have problems the morning of surgery: [phone]  Remember:   Do not eat food:(After Midnight) Desps de medianoche.  You may drink clear liquids until arrival at __0530___.  Clear liquids means a liquid you can see thru.  It can have color such as Cola or Kool aid.  Tea is OK and coffee as long as no milk or creamer of any kind.  Take these medicines the morning of surgery with A SIP OF WATER:  valtrex   Do not wear jewelry, make-up or nail polish.  Do not wear lotions, powders, or perfumes. Do not wear deodorant.  Do not shave 48 hours prior to surgery.  Do not bring valuables to the hospital.  [facility] is not   responsible for any belongings or valuables brought to the hospital.  Contacts, dentures or bridgework may not be worn into surgery.  Leave suitcase in the car. After surgery it may be brought to your room.  For patients admitted to the hospital, checkout time is 11:00 AM the day of              discharge.      Please read over the following fact sheets that you were given:     Preparing for Surgery

## 2023-05-18 ENCOUNTER — Encounter: Payer: Self-pay | Admitting: Family

## 2023-05-19 ENCOUNTER — Encounter: Payer: Self-pay | Admitting: Family Medicine

## 2023-05-19 NOTE — Telephone Encounter (Signed)
Is this something you would do? Or obgyn?

## 2023-05-20 ENCOUNTER — Encounter (HOSPITAL_COMMUNITY)
Admission: RE | Admit: 2023-05-20 | Discharge: 2023-05-20 | Disposition: A | Discharge status: Home / Self Care | Payer: 59 | Source: Ambulatory Visit | Attending: Family Medicine | Admitting: Family Medicine

## 2023-05-20 DIAGNOSIS — Z3490 Encounter for supervision of normal pregnancy, unspecified, unspecified trimester: Secondary | ICD-10-CM

## 2023-05-20 DIAGNOSIS — Z01812 Encounter for preprocedural laboratory examination: Secondary | ICD-10-CM | POA: Diagnosis present

## 2023-05-20 HISTORY — DX: Supervision of pregnancy with other poor reproductive or obstetric history, unspecified trimester: O09.299

## 2023-05-20 LAB — CBC
HCT: 31.9 % — ABNORMAL LOW (ref 36.0–46.0)
Hemoglobin: 10.7 g/dL — ABNORMAL LOW (ref 12.0–15.0)
MCH: 27.4 pg (ref 26.0–34.0)
MCHC: 33.5 g/dL (ref 30.0–36.0)
MCV: 81.8 fL (ref 80.0–100.0)
Platelets: 317 10*3/uL (ref 150–400)
RBC: 3.9 MIL/uL (ref 3.87–5.11)
RDW: 13.8 % (ref 11.5–15.5)
WBC: 6.2 10*3/uL (ref 4.0–10.5)
nRBC: 0 % (ref 0.0–0.2)

## 2023-05-20 LAB — TYPE AND SCREEN
ABO/RH(D): O POS
Antibody Screen: NEGATIVE

## 2023-05-20 LAB — RPR: RPR Ser Ql: NONREACTIVE

## 2023-05-22 ENCOUNTER — Inpatient Hospital Stay (HOSPITAL_COMMUNITY)
Admission: RE | Admit: 2023-05-22 | Discharge: 2023-05-25 | DRG: 784 | Disposition: A | Discharge status: Home / Self Care | Payer: 59 | Attending: Obstetrics and Gynecology | Admitting: Obstetrics and Gynecology

## 2023-05-22 ENCOUNTER — Inpatient Hospital Stay (HOSPITAL_COMMUNITY): Payer: 59 | Admitting: Anesthesiology

## 2023-05-22 ENCOUNTER — Other Ambulatory Visit: Payer: Self-pay

## 2023-05-22 ENCOUNTER — Encounter (HOSPITAL_COMMUNITY): Payer: Self-pay | Admitting: Obstetrics and Gynecology

## 2023-05-22 ENCOUNTER — Encounter (HOSPITAL_COMMUNITY)
Admission: RE | Disposition: A | Discharge status: Home / Self Care | Payer: Self-pay | Source: Home / Self Care | Attending: Obstetrics and Gynecology

## 2023-05-22 ENCOUNTER — Inpatient Hospital Stay (HOSPITAL_COMMUNITY): Payer: Self-pay | Admitting: Anesthesiology

## 2023-05-22 DIAGNOSIS — O09523 Supervision of elderly multigravida, third trimester: Secondary | ICD-10-CM | POA: Diagnosis not present

## 2023-05-22 DIAGNOSIS — O34211 Maternal care for low transverse scar from previous cesarean delivery: Secondary | ICD-10-CM

## 2023-05-22 DIAGNOSIS — Z8249 Family history of ischemic heart disease and other diseases of the circulatory system: Secondary | ICD-10-CM | POA: Diagnosis not present

## 2023-05-22 DIAGNOSIS — F32A Depression, unspecified: Secondary | ICD-10-CM | POA: Diagnosis present

## 2023-05-22 DIAGNOSIS — O99214 Obesity complicating childbirth: Secondary | ICD-10-CM

## 2023-05-22 DIAGNOSIS — A6000 Herpesviral infection of urogenital system, unspecified: Secondary | ICD-10-CM | POA: Diagnosis present

## 2023-05-22 DIAGNOSIS — Z98891 History of uterine scar from previous surgery: Secondary | ICD-10-CM

## 2023-05-22 DIAGNOSIS — O99820 Streptococcus B carrier state complicating pregnancy: Secondary | ICD-10-CM | POA: Diagnosis not present

## 2023-05-22 DIAGNOSIS — O99824 Streptococcus B carrier state complicating childbirth: Secondary | ICD-10-CM | POA: Diagnosis present

## 2023-05-22 DIAGNOSIS — F419 Anxiety disorder, unspecified: Secondary | ICD-10-CM | POA: Diagnosis present

## 2023-05-22 DIAGNOSIS — O9081 Anemia of the puerperium: Secondary | ICD-10-CM | POA: Diagnosis not present

## 2023-05-22 DIAGNOSIS — D62 Acute posthemorrhagic anemia: Secondary | ICD-10-CM | POA: Diagnosis not present

## 2023-05-22 DIAGNOSIS — Z87891 Personal history of nicotine dependence: Secondary | ICD-10-CM | POA: Diagnosis not present

## 2023-05-22 DIAGNOSIS — O99344 Other mental disorders complicating childbirth: Secondary | ICD-10-CM | POA: Diagnosis present

## 2023-05-22 DIAGNOSIS — Z833 Family history of diabetes mellitus: Secondary | ICD-10-CM | POA: Diagnosis not present

## 2023-05-22 DIAGNOSIS — Z3A39 39 weeks gestation of pregnancy: Secondary | ICD-10-CM

## 2023-05-22 DIAGNOSIS — Z3490 Encounter for supervision of normal pregnancy, unspecified, unspecified trimester: Principal | ICD-10-CM

## 2023-05-22 DIAGNOSIS — E785 Hyperlipidemia, unspecified: Secondary | ICD-10-CM | POA: Diagnosis present

## 2023-05-22 DIAGNOSIS — O99340 Other mental disorders complicating pregnancy, unspecified trimester: Secondary | ICD-10-CM | POA: Diagnosis not present

## 2023-05-22 DIAGNOSIS — E66813 Obesity, class 3: Secondary | ICD-10-CM | POA: Diagnosis present

## 2023-05-22 DIAGNOSIS — O9832 Other infections with a predominantly sexual mode of transmission complicating childbirth: Secondary | ICD-10-CM | POA: Diagnosis present

## 2023-05-22 DIAGNOSIS — O134 Gestational [pregnancy-induced] hypertension without significant proteinuria, complicating childbirth: Secondary | ICD-10-CM | POA: Diagnosis present

## 2023-05-22 DIAGNOSIS — Z148 Genetic carrier of other disease: Secondary | ICD-10-CM

## 2023-05-22 DIAGNOSIS — Z302 Encounter for sterilization: Secondary | ICD-10-CM

## 2023-05-22 SURGERY — Surgical Case
Anesthesia: Spinal | Site: Abdomen | Laterality: Bilateral

## 2023-05-22 MED ORDER — SODIUM CHLORIDE 0.9 % IV SOLN
INTRAVENOUS | Status: AC
  Filled 2023-05-22: qty 3

## 2023-05-22 MED ORDER — LACTATED RINGERS IV SOLN: INTRAVENOUS | Status: DC

## 2023-05-22 MED ORDER — SERTRALINE HCL 50 MG PO TABS
75.0000 mg | ORAL_TABLET | Freq: Every day | ORAL | Status: DC
  Administered 2023-05-22 – 2023-05-24 (×3): 75 mg via ORAL
  Filled 2023-05-22 (×3): qty 1

## 2023-05-22 MED ORDER — SODIUM CHLORIDE 0.9% FLUSH: 3.0000 mL | INTRAVENOUS | Status: DC | PRN

## 2023-05-22 MED ORDER — ONDANSETRON HCL 4 MG/2ML IJ SOLN
INTRAMUSCULAR | Status: DC | PRN
  Administered 2023-05-22: 4 mg via INTRAVENOUS

## 2023-05-22 MED ORDER — ACETAMINOPHEN 10 MG/ML IV SOLN
INTRAVENOUS | Status: DC | PRN
  Administered 2023-05-22: 1000 mg via INTRAVENOUS

## 2023-05-22 MED ORDER — COCONUT OIL OIL: 1.0000 | TOPICAL_OIL | Status: DC | PRN

## 2023-05-22 MED ORDER — KETOROLAC TROMETHAMINE 30 MG/ML IJ SOLN
INTRAMUSCULAR | Status: AC
  Filled 2023-05-22: qty 1

## 2023-05-22 MED ORDER — FENTANYL CITRATE (PF) 100 MCG/2ML IJ SOLN
INTRAMUSCULAR | Status: DC | PRN
  Administered 2023-05-22: 15 ug via INTRATHECAL

## 2023-05-22 MED ORDER — DIPHENHYDRAMINE HCL 25 MG PO CAPS
25.0000 mg | ORAL_CAPSULE | Freq: Four times a day (QID) | ORAL | Status: DC | PRN
  Filled 2023-05-22: qty 1

## 2023-05-22 MED ORDER — ACETAMINOPHEN 500 MG PO TABS: 1000.0000 mg | ORAL_TABLET | Freq: Four times a day (QID) | ORAL | Status: DC

## 2023-05-22 MED ORDER — SODIUM CHLORIDE 0.9 % IV SOLN
3.0000 g | INTRAVENOUS | Status: AC
  Administered 2023-05-22: 3 g via INTRAVENOUS

## 2023-05-22 MED ORDER — DEXAMETHASONE SODIUM PHOSPHATE 10 MG/ML IJ SOLN
INTRAMUSCULAR | Status: AC
Start: 2023-05-22 — End: ?
  Filled 2023-05-22: qty 1

## 2023-05-22 MED ORDER — SOD CITRATE-CITRIC ACID 500-334 MG/5ML PO SOLN
30.0000 mL | ORAL | Status: AC
  Administered 2023-05-22: 30 mL via ORAL

## 2023-05-22 MED ORDER — SODIUM CHLORIDE 0.9 % IR SOLN
Status: DC | PRN
  Administered 2023-05-22: 1000 mL

## 2023-05-22 MED ORDER — NALOXONE HCL 0.4 MG/ML IJ SOLN: 0.4000 mg | INTRAMUSCULAR | Status: DC | PRN

## 2023-05-22 MED ORDER — DIPHENHYDRAMINE HCL 50 MG/ML IJ SOLN: 12.5000 mg | INTRAMUSCULAR | Status: DC | PRN

## 2023-05-22 MED ORDER — ZOLPIDEM TARTRATE 5 MG PO TABS: 5.0000 mg | ORAL_TABLET | Freq: Every evening | ORAL | Status: DC | PRN

## 2023-05-22 MED ORDER — FENTANYL CITRATE (PF) 100 MCG/2ML IJ SOLN
INTRAMUSCULAR | Status: AC
  Filled 2023-05-22: qty 2

## 2023-05-22 MED ORDER — MEPERIDINE HCL 25 MG/ML IJ SOLN: 6.2500 mg | INTRAMUSCULAR | Status: DC | PRN

## 2023-05-22 MED ORDER — MENTHOL 3 MG MT LOZG: 1.0000 | LOZENGE | OROMUCOSAL | Status: DC | PRN

## 2023-05-22 MED ORDER — HYDROMORPHONE HCL 1 MG/ML IJ SOLN
0.2000 mg | INTRAMUSCULAR | Status: DC | PRN
  Administered 2023-05-23: 0.2 mg via INTRAVENOUS
  Administered 2023-05-24 (×2): 0.6 mg via INTRAVENOUS
  Filled 2023-05-22 (×4): qty 1

## 2023-05-22 MED ORDER — SCOPOLAMINE 1 MG/3DAYS TD PT72
1.0000 | MEDICATED_PATCH | Freq: Once | TRANSDERMAL | Status: AC
  Administered 2023-05-22: 1.5 mg via TRANSDERMAL

## 2023-05-22 MED ORDER — MORPHINE SULFATE (PF) 0.5 MG/ML IJ SOLN
INTRAMUSCULAR | Status: DC | PRN
  Administered 2023-05-22: 150 ug via INTRATHECAL

## 2023-05-22 MED ORDER — ACETAMINOPHEN 500 MG PO TABS
1000.0000 mg | ORAL_TABLET | Freq: Four times a day (QID) | ORAL | Status: DC
  Administered 2023-05-22 – 2023-05-25 (×10): 1000 mg via ORAL
  Filled 2023-05-22 (×13): qty 2

## 2023-05-22 MED ORDER — DEXAMETHASONE SODIUM PHOSPHATE 10 MG/ML IJ SOLN
INTRAMUSCULAR | Status: DC | PRN
  Administered 2023-05-22: 10 mg via INTRAVENOUS

## 2023-05-22 MED ORDER — IBUPROFEN 600 MG PO TABS
600.0000 mg | ORAL_TABLET | Freq: Four times a day (QID) | ORAL | Status: DC | PRN
  Filled 2023-05-22: qty 1

## 2023-05-22 MED ORDER — POVIDONE-IODINE 10 % EX SWAB
2.0000 | Freq: Once | CUTANEOUS | Status: AC
  Administered 2023-05-22: 2 via TOPICAL

## 2023-05-22 MED ORDER — DIPHENHYDRAMINE HCL 25 MG PO CAPS
25.0000 mg | ORAL_CAPSULE | ORAL | Status: DC | PRN
  Administered 2023-05-22 – 2023-05-23 (×2): 25 mg via ORAL
  Filled 2023-05-22: qty 1

## 2023-05-22 MED ORDER — MORPHINE SULFATE (PF) 0.5 MG/ML IJ SOLN
INTRAMUSCULAR | Status: AC
  Filled 2023-05-22: qty 10

## 2023-05-22 MED ORDER — OXYCODONE HCL 5 MG PO TABS
5.0000 mg | ORAL_TABLET | ORAL | Status: DC | PRN
  Administered 2023-05-22: 5 mg via ORAL
  Administered 2023-05-23: 10 mg via ORAL
  Administered 2023-05-23: 5 mg via ORAL
  Administered 2023-05-23 – 2023-05-24 (×4): 10 mg via ORAL
  Filled 2023-05-22 (×3): qty 2
  Filled 2023-05-22: qty 1
  Filled 2023-05-22 (×2): qty 2
  Filled 2023-05-22: qty 1

## 2023-05-22 MED ORDER — OXYTOCIN-SODIUM CHLORIDE 30-0.9 UT/500ML-% IV SOLN
2.5000 [IU]/h | INTRAVENOUS | Status: DC
  Administered 2023-05-22: 2.5 [IU]/h via INTRAVENOUS
  Filled 2023-05-22: qty 500

## 2023-05-22 MED ORDER — WITCH HAZEL-GLYCERIN EX PADS: 1.0000 | MEDICATED_PAD | CUTANEOUS | Status: DC | PRN

## 2023-05-22 MED ORDER — SIMETHICONE 80 MG PO CHEW
80.0000 mg | CHEWABLE_TABLET | Freq: Three times a day (TID) | ORAL | Status: DC
  Administered 2023-05-22 – 2023-05-25 (×8): 80 mg via ORAL
  Filled 2023-05-22 (×8): qty 1

## 2023-05-22 MED ORDER — DIBUCAINE (PERIANAL) 1 % EX OINT: 1.0000 | TOPICAL_OINTMENT | CUTANEOUS | Status: DC | PRN

## 2023-05-22 MED ORDER — BUPIVACAINE IN DEXTROSE 0.75-8.25 % IT SOLN
INTRATHECAL | Status: DC | PRN
  Administered 2023-05-22: 1.6 mL via INTRATHECAL

## 2023-05-22 MED ORDER — SCOPOLAMINE 1 MG/3DAYS TD PT72
MEDICATED_PATCH | TRANSDERMAL | Status: AC
  Filled 2023-05-22: qty 1

## 2023-05-22 MED ORDER — ONDANSETRON HCL 4 MG/2ML IJ SOLN: 4.0000 mg | Freq: Three times a day (TID) | INTRAMUSCULAR | Status: DC | PRN

## 2023-05-22 MED ORDER — STERILE WATER FOR IRRIGATION IR SOLN
Status: DC | PRN
  Administered 2023-05-22: 1000 mL

## 2023-05-22 MED ORDER — PRENATAL MULTIVITAMIN CH
1.0000 | ORAL_TABLET | Freq: Every day | ORAL | Status: DC
  Administered 2023-05-23 – 2023-05-25 (×3): 1 via ORAL
  Filled 2023-05-22 (×3): qty 1

## 2023-05-22 MED ORDER — NALOXONE HCL 4 MG/10ML IJ SOLN: 1.0000 ug/kg/h | INTRAVENOUS | Status: DC | PRN

## 2023-05-22 MED ORDER — SOD CITRATE-CITRIC ACID 500-334 MG/5ML PO SOLN
ORAL | Status: AC
  Filled 2023-05-22: qty 30

## 2023-05-22 MED ORDER — SENNOSIDES-DOCUSATE SODIUM 8.6-50 MG PO TABS
2.0000 | ORAL_TABLET | Freq: Every day | ORAL | Status: DC
  Administered 2023-05-23 – 2023-05-25 (×3): 2 via ORAL
  Filled 2023-05-22 (×3): qty 2

## 2023-05-22 MED ORDER — PHENYLEPHRINE HCL-NACL 20-0.9 MG/250ML-% IV SOLN
INTRAVENOUS | Status: DC | PRN
  Administered 2023-05-22: 60 ug/min via INTRAVENOUS

## 2023-05-22 MED ORDER — ONDANSETRON HCL 4 MG/2ML IJ SOLN
INTRAMUSCULAR | Status: AC
  Filled 2023-05-22: qty 2

## 2023-05-22 MED ORDER — OXYTOCIN-SODIUM CHLORIDE 30-0.9 UT/500ML-% IV SOLN
INTRAVENOUS | Status: DC | PRN
  Administered 2023-05-22 (×2): 30 [IU] via INTRAVENOUS

## 2023-05-22 MED ORDER — KETOROLAC TROMETHAMINE 30 MG/ML IJ SOLN
30.0000 mg | Freq: Once | INTRAMUSCULAR | Status: AC | PRN
  Administered 2023-05-22: 30 mg via INTRAVENOUS

## 2023-05-22 MED ORDER — SIMETHICONE 80 MG PO CHEW
80.0000 mg | CHEWABLE_TABLET | ORAL | Status: DC | PRN
  Administered 2023-05-23 (×2): 80 mg via ORAL
  Filled 2023-05-22 (×2): qty 1

## 2023-05-22 SURGICAL SUPPLY — 31 items
BENZOIN TINCTURE PRP APPL 2/3 (GAUZE/BANDAGES/DRESSINGS) IMPLANT
CHLORAPREP W/TINT 26 (MISCELLANEOUS) ×2 IMPLANT
CLAMP UMBILICAL CORD (MISCELLANEOUS) ×1 IMPLANT
CLOTH BEACON ORANGE TIMEOUT ST (SAFETY) ×1 IMPLANT
DERMABOND ADVANCED .7 DNX12 (GAUZE/BANDAGES/DRESSINGS) IMPLANT
DRSG OPSITE POSTOP 4X10 (GAUZE/BANDAGES/DRESSINGS) ×1 IMPLANT
ELECT REM PT RETURN 9FT ADLT (ELECTROSURGICAL) ×1
ELECTRODE REM PT RTRN 9FT ADLT (ELECTROSURGICAL) ×1 IMPLANT
EXTRACTOR VACUUM M CUP 4 TUBE (SUCTIONS) IMPLANT
GLOVE BIO SURGEON STRL SZ7.5 (GLOVE) ×1 IMPLANT
GLOVE BIOGEL PI IND STRL 7.0 (GLOVE) ×1 IMPLANT
GOWN SRG XL 47XLVL 4 REINF (GOWN DISPOSABLE) ×1 IMPLANT
GOWN STRL REUS W/TWL LRG LVL3 (GOWN DISPOSABLE) ×1 IMPLANT
KIT ABG SYR 3ML LUER SLIP (SYRINGE) ×1 IMPLANT
MAT PREVALON FULL STRYKER (MISCELLANEOUS) IMPLANT
NDL HYPO 25X5/8 SAFETYGLIDE (NEEDLE) ×1 IMPLANT
NEEDLE HYPO 22GX1.5 SAFETY (NEEDLE) ×1 IMPLANT
NEEDLE HYPO 25X5/8 SAFETYGLIDE (NEEDLE) ×1
NS IRRIG 1000ML POUR BTL (IV SOLUTION) ×1 IMPLANT
PACK C SECTION WH (CUSTOM PROCEDURE TRAY) ×1 IMPLANT
PAD OB MATERNITY 4.3X12.25 (PERSONAL CARE ITEMS) ×1 IMPLANT
STRIP CLOSURE SKIN 1/2X4 (GAUZE/BANDAGES/DRESSINGS) IMPLANT
SUT MNCRL 0 VIOLET CTX 36 (SUTURE) ×4 IMPLANT
SUT PDS AB 0 CTX 60 (SUTURE) ×1 IMPLANT
SUT PLAIN 0 NONE (SUTURE) IMPLANT
SUT PLAIN 2 0 XLH (SUTURE) ×1 IMPLANT
SUT PLAIN ABS 2-0 CT1 27XMFL (SUTURE) IMPLANT
SUT VIC AB 4-0 KS 27 (SUTURE) ×1 IMPLANT
TOWEL OR 17X24 6PK STRL BLUE (TOWEL DISPOSABLE) ×1 IMPLANT
TRAY FOLEY W/BAG SLVR 14FR LF (SET/KITS/TRAYS/PACK) ×1 IMPLANT
WATER STERILE IRR 1000ML POUR (IV SOLUTION) ×1 IMPLANT

## 2023-05-22 NOTE — Progress Notes (Signed)
No changes to H&P per patient history Reviewed procedure-C/S and BTL NKDA All questions answered She states she understands and agrees

## 2023-05-22 NOTE — H&P (Signed)
Melanie Stout is a 37 y.o. female presenting for repeat cesarean section and bilateral tubal ligation. 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 OB History     Gravida  5   Para  3   Term  3   Preterm  0   AB  1   Living  3      SAB  1   IAB  0   Ectopic  0   Multiple  0   Live Births  3          Past Medical History:  Diagnosis Date   Abnormal Pap smear    Blood transfusion without reported diagnosis 01/05/2013   Headache(784.0)    History of postpartum hemorrhage, currently pregnant    Hx of varicella    Hyperlipidemia    Past Surgical History:  Procedure Laterality Date   CESAREAN SECTION N/A 03/26/2022   Procedure: CESAREAN SECTION;  Surgeon: Mitchel Honour, DO;  Location: [facility] LD ORS;  Service: Obstetrics;  Laterality: N/A;   NOSE SURGERY     Family History: family history includes Diabetes in her maternal aunt; Hypertension in her mother; Parkinson's disease in her father. Social History:  reports that she quit smoking about 6 years ago. Her smoking use included cigarettes. She has never used smokeless tobacco. She reports that she does not currently use alcohol. She reports that she does not use drugs.     Maternal Diabetes: No Genetic Screening: Normal Maternal Ultrasounds/Referrals: Normal Fetal Ultrasounds or other Referrals:  None Maternal Substance Abuse:  No Significant Maternal Medications:  Meds include: Other: sertraline Significant Maternal Lab Results:  Group B Strep positive Number of Prenatal Visits:greater than 3 verified prenatal visits Maternal Vaccinations: Other Comments:  None  Review of Systems  Constitutional:  Negative for fever.  Neurological:  Negative for headaches.   Maternal Medical History:  Fetal activity: Perceived fetal  activity is normal.       Blood pressure (!) 139/92, pulse 90, temperature 98.7 F (37.1 C), temperature source Oral, resp. rate 19, height 5\' 6"  (1.676 m), weight 123.3 kg, SpO2 95%, not currently breastfeeding. Exam Physical Exam Cardiovascular:     Rate and Rhythm: Normal rate.  Pulmonary:     Effort: Pulmonary effort is normal.     Prenatal labs: ABO, Rh: --/--/O POS (12/17 1610) Antibody: NEG (12/17 0933) Rubella: Immune (06/25 0000) RPR: NON REACTIVE (12/17 0925)  HBsAg: Negative (06/25 0000)  HIV: Non-reactive (06/25 0000)  GBS: Positive/-- (12/03 0000)   Assessment/Plan: 37 yo G5P3 for repeat C/S and BTL D/W procedure and risks including infection, organ damage, bleeding/transfusion-HIV/Hep, DVT/PE, pneumonia, wound breakdown. D/W permanence of BTL, failure rate, increased ectopic risks. She states she understands and agrees.   Melanie Stout II 05/22/2023, 6:35 AM

## 2023-05-22 NOTE — Transfer of Care (Signed)
Immediate Anesthesia Transfer of Care Note  Patient: Melanie Stout  Procedure(s) Performed: REPEAT CESAREAN SECTION X1 WITH BILATERAL TUBAL LIGATION (Bilateral: Abdomen)  Patient Location: PACU  Anesthesia Type:Spinal  Level of Consciousness: awake  Airway & Oxygen Therapy: Patient Spontanous Breathing  Post-op Assessment: Report given to RN and Post -op Vital signs reviewed and stable  Post vital signs: Reviewed and stable  Last Vitals:  Vitals Value Taken Time  BP 116/61 05/22/23 0900  Temp 36.1 C 05/22/23 0900  Pulse 60 05/22/23 0904  Resp 16 05/22/23 0904  SpO2 95 % 05/22/23 0904  Vitals shown include unfiled device data.  Last Pain:  Vitals:   05/22/23 0900  TempSrc: Axillary         Complications: No notable events documented.

## 2023-05-22 NOTE — Anesthesia Preprocedure Evaluation (Signed)
Anesthesia Evaluation  Patient identified by MRN, date of birth, ID band Patient awake    Reviewed: Allergy & Precautions, NPO status , Patient's Chart, lab work & pertinent test results  Airway Mallampati: II  TM Distance: >3 FB Neck ROM: Full    Dental no notable dental hx.    Pulmonary neg pulmonary ROS, former smoker   Pulmonary exam normal        Cardiovascular negative cardio ROS  Rhythm:Regular Rate:Normal     Neuro/Psych  Headaches  negative psych ROS   GI/Hepatic negative GI ROS, Neg liver ROS,,,  Endo/Other    Class 3 obesity  Renal/GU negative Renal ROS  negative genitourinary   Musculoskeletal   Abdominal Normal abdominal exam  (+)   Peds  Hematology  (+) Blood dyscrasia, anemia Lab Results      Component                Value               Date                      WBC                      6.2                 05/20/2023                HGB                      10.7 (L)            05/20/2023                HCT                      31.9 (L)            05/20/2023                MCV                      81.8                05/20/2023                PLT                      317                 05/20/2023              Anesthesia Other Findings   Reproductive/Obstetrics (+) Pregnancy                             Anesthesia Physical Anesthesia Plan  ASA: 3  Anesthesia Plan: Spinal   Post-op Pain Management:    Induction:   PONV Risk Score and Plan: 2 and Treatment may vary due to age or medical condition, Ondansetron and Dexamethasone  Airway Management Planned: Natural Airway and Nasal Cannula  Additional Equipment: None  Intra-op Plan:   Post-operative Plan:   Informed Consent: I have reviewed the patients History and Physical, chart, labs and discussed the procedure including the risks, benefits and alternatives for the proposed anesthesia with the patient or  authorized representative who has indicated his/her understanding and acceptance.  Dental advisory given  Plan Discussed with: CRNA  Anesthesia Plan Comments:        Anesthesia Quick Evaluation

## 2023-05-22 NOTE — Anesthesia Procedure Notes (Signed)
Spinal  Patient location during procedure: OR Start time: 05/22/2023 7:40 AM End time: 05/22/2023 7:42 AM Staffing Performed: anesthesiologist  Anesthesiologist: Atilano Median, DO Performed by: Atilano Median, DO Authorized by: Atilano Median, DO   Preanesthetic Checklist Completed: patient identified, IV checked, site marked, risks and benefits discussed, surgical consent, monitors and equipment checked, pre-op evaluation and timeout performed Spinal Block Patient position: sitting Prep: DuraPrep Patient monitoring: heart rate, cardiac monitor, continuous pulse ox and blood pressure Approach: midline Location: L3-4 Injection technique: single-shot Needle Needle type: Pencan  Needle gauge: 24 G Needle length: 10 cm Assessment Events: CSF return Additional Notes Patient identified. Risks/Benefits/Options discussed with patient including but not limited to bleeding, infection, nerve damage, paralysis, failed block, incomplete pain control, headache, blood pressure changes, nausea, vomiting, reactions to medications, itching and postpartum back pain. Confirmed with bedside nurse the patient's most recent platelet count. Confirmed with patient that they are not currently taking any anticoagulation, have any bleeding history or any family history of bleeding disorders. Patient expressed understanding and wished to proceed. All questions were answered. Sterile technique was used throughout the entire procedure. Please see nursing notes for vital signs. Warning signs of high block given to the patient including shortness of breath, tingling/numbness in hands, complete motor block, or any concerning symptoms with instructions to call for help. Patient was given instructions on fall risk and not to get out of bed. All questions and concerns addressed with instructions to call with any issues or inadequate analgesia.

## 2023-05-22 NOTE — Brief Op Note (Signed)
05/22/2023  9:05 AM  PATIENT:  Lendon Colonel  37 y.o. female  PRE-OPERATIVE DIAGNOSIS:  desires repeat cesarean section and permanent sterilization  POST-OPERATIVE DIAGNOSIS:  desires repeat cesarean section and permanent sterilization  PROCEDURE:  Procedure(s): REPEAT CESAREAN SECTION X1 WITH BILATERAL TUBAL LIGATION (Bilateral)  SURGEON:  Surgeons and Role:    * Harold Hedge, MD - Primary    * Wyn Forster, MD - Fellow  PHYSICIAN ASSISTANT:   ASSISTANTS:   ANESTHESIA:   spinal  EBL:  761 ml  BLOOD ADMINISTERED:none  DRAINS: Urinary Catheter (Foley)   LOCAL MEDICATIONS USED:  NONE  SPECIMEN:  Source of Specimen:  bilateral fallopian tube segments  DISPOSITION OF SPECIMEN:  PATHOLOGY  COUNTS:  YES  TOURNIQUET:  * No tourniquets in log *  DICTATION: .Other Dictation: Dictation Number [id number]  PLAN OF CARE: Admit to inpatient   PATIENT DISPOSITION:  PACU - hemodynamically stable.   Delay start of Pharmacological VTE agent (>24hrs) due to surgical blood loss or risk of bleeding: not applicable

## 2023-05-22 NOTE — Op Note (Signed)
Melanie Stout, Melanie Stout MEDICAL RECORD NO: [id number] ACCOUNT NO: [id number] DATE OF BIRTH: [birth year] FACILITY: [facility] LOCATION: [facility] PHYSICIAN: Guy Sandifer. Arleta Creek, MD  Operative Report   DATE OF PROCEDURE: 05/22/2023  PREOPERATIVE DIAGNOSES: 1.  Desires repeat cesarean section. 2.  Permanent sterilization.  POSTOPERATIVE DIAGNOSES: 1.  Desires repeat cesarean section. 2.  Permanent sterilization.  PROCEDURE: 1.  Repeat low transverse cesarean section. 2.  Bilateral tubal ligation.  SURGEON:  Guy Sandifer. Arleta Creek, MD  ASSISTANT:  Wyn Forster, MD   An experienced assistant was required given the standard of surgical care given the complexity of the case.  This assistant was needed for exposure, dissection, suctioning, retraction, instrument exchange, assisting with delivery with administration of fundal pressure, and for overall help during the procedure    ANESTHESIA:  Spinal.  ESTIMATED BLOOD LOSS:  761 mL.  FINDINGS:  Viable female infant.  Weight, Apgars, arterial cord pH pending.  SPECIMENS:  Bilateral fallopian tube segments to pathology.  INDICATIONS AND CONSENT:  This patient is a 37 year old G5, P3 at 19 and 1/7 weeks with previous cesarean section.  She desires repeat and permanent sterilization.  Risks and complications have been discussed preoperatively including but not limited to  infection, organ damage, bleeding requiring transfusion of blood products with HIV and hepatitis acquisition, DVT, PE, pneumonia.  Tubal ligation with permanence, failure rate, and increased ectopic risk has been discussed.  She states she understands  and agrees and consent was signed on the chart.  DESCRIPTION OF PROCEDURE:  The patient was taken to the operating room where she was identified.  Anesthesia was placed with anesthesiology and she was placed in the dorsal supine position with a left lateral wedge.  A Traxi was then placed to elevate  the panniculus.  She was  prepped vaginally with Betadine.  Foley catheter was placed and prepped abdominally with ChloraPrep.  Timeout was done.  After a 3-minute drying time, she was draped in a sterile fashion.  After testing for adequate spinal  anesthesia, the skin was entered through a Pfannenstiel incision following the previous scar.  Dissection was carried out in layers to the peritoneum, which was taken down superiorly and inferiorly.  Bladder flap was developed and moved out of the way.   The uterus was incised in a low transverse manner and the uterine cavity was entered bluntly with a hemostat.  The uterine incision was extended bilaterally with the fingers.  Clear fluid was noted.  Vertex was delivered without difficulty.  The baby was  then delivered and good cry and tone was noted.  After 1 minute, the cord was clamped and cut and the baby was handed to the waiting pediatrics team.  Placenta was delivered.  Uterine cavity was clean.  Uterus was closed in two running locking  imbricating layers of 0 Monocryl suture, which achieved good hemostasis.  Left fallopian tube was identified from cornea to fimbria.  It was grasped in its mid ampullary portion with a Babcock clamp.  A knuckle of tube was then doubly ligated with two  free ties of plain suture and the intervening knuckle was sharply resected.  Unipolar cautery was used to assure hemostasis.  Same procedure was carried out on the right fallopian tube.  Lavage was carried out and all returns were clear.  Anterior  peritoneum was closed in a running fashion with a 0 Monocryl suture, which was also used to reapproximate the pyramidalis muscle in the midline.  Anterior rectus fascia was  closed in a running fashion with a 0 looped PDS.  Subcutaneous layers were closed  with interrupted plane and the skin was closed in a subcuticular fashion with 4-0 Vicryl and a Keith needle.  Steri-Strips were applied with benzoin and honeycomb dressing was applied.  All counts were  correct.  The patient was taken to the recovery  room in stable condition.   PUS D: 05/22/2023 9:10:49 am T: 05/22/2023 9:21:00 am  JOB: [id number]/ [id number]

## 2023-05-22 NOTE — Anesthesia Postprocedure Evaluation (Signed)
Anesthesia Post Note  Patient: Lendon Colonel  Procedure(s) Performed: REPEAT CESAREAN SECTION X1 WITH BILATERAL TUBAL LIGATION (Bilateral: Abdomen)     Patient location during evaluation: Mother Baby Anesthesia Type: Spinal Level of consciousness: oriented and awake and alert Pain management: pain level controlled Vital Signs Assessment: post-procedure vital signs reviewed and stable Respiratory status: spontaneous breathing and respiratory function stable Cardiovascular status: blood pressure returned to baseline and stable Postop Assessment: no headache, no backache, no apparent nausea or vomiting and able to ambulate Anesthetic complications: no   No notable events documented.  Last Vitals:  Vitals:   05/22/23 1000 05/22/23 1025  BP: 129/71 129/78  Pulse: 70 69  Resp: 15 16  Temp: 36.5 C (!) 36.4 C  SpO2: 97% 98%    Last Pain:  Vitals:   05/22/23 1025  TempSrc: Oral  PainSc:                  Nelle Don Riley Hallum

## 2023-05-23 LAB — CBC
HCT: 27.5 % — ABNORMAL LOW (ref 36.0–46.0)
Hemoglobin: 9.3 g/dL — ABNORMAL LOW (ref 12.0–15.0)
MCH: 27.8 pg (ref 26.0–34.0)
MCHC: 33.8 g/dL (ref 30.0–36.0)
MCV: 82.3 fL (ref 80.0–100.0)
Platelets: 258 10*3/uL (ref 150–400)
RBC: 3.34 MIL/uL — ABNORMAL LOW (ref 3.87–5.11)
RDW: 13.8 % (ref 11.5–15.5)
WBC: 14.4 10*3/uL — ABNORMAL HIGH (ref 4.0–10.5)
nRBC: 0 % (ref 0.0–0.2)

## 2023-05-23 LAB — BIRTH TISSUE RECOVERY COLLECTION (PLACENTA DONATION)

## 2023-05-23 MED ORDER — IBUPROFEN 600 MG PO TABS
600.0000 mg | ORAL_TABLET | Freq: Four times a day (QID) | ORAL | Status: DC
  Administered 2023-05-23 – 2023-05-25 (×9): 600 mg via ORAL
  Filled 2023-05-23 (×10): qty 1

## 2023-05-23 MED ORDER — FERROUS SULFATE 325 (65 FE) MG PO TABS
325.0000 mg | ORAL_TABLET | Freq: Every day | ORAL | Status: DC
  Administered 2023-05-24 – 2023-05-25 (×2): 325 mg via ORAL
  Filled 2023-05-23 (×2): qty 1

## 2023-05-23 NOTE — Progress Notes (Signed)
CSW received consult for hx of Anxiety and Depression.  CSW met with MOB to offer support and complete assessment CSW entered the room, introduced herself and acknowledged that FOB was present. MOB gave CSW verbal permission to speak about anything while FOB was present. CSW explained her role and the reason for the visit.  MOB was polite, easy to engage, receptive to meeting with CSW, and appeared forthcoming.  CSW congratulated MOB and FOB on the birth of infant. CSW inquired about MOB's mental health history. MOB reported being diagnosed with anxiety and depression; however could not recall when. MOB reported participating in therapy off and on in the past; and currently through her job 6 visits are offered before they will require payment. CSW provided [place]. CSW asked MOB about the sertraline 75mg  she is currently prescribed; and per chart review not finding the medication helpful. MOB reported during pregnancy she was not taking the medication as prescribed due to her pregnancy and wanting to keep the infant safe; however now that she has delivered she will begin to taking the medication as prescribed for support. MOB reported PPD with her last pregnancy due to her circumstances; however the situation has changed and has no concerns. MOB reported her supports as FOB and her mom. CSW provided education regarding the baby blues period vs. perinatal mood disorders, discussed treatment and gave resources for mental health follow up if concerns arise.  CSW recommends self-evaluation during the postpartum time period using the New Mom Checklist from Postpartum Progress and encouraged MOB to contact a medical professional if symptoms are noted at any time. CSW assessed for safety with MOB SI and HI; MOB denied all. CSW did not assess for DV; FOB was present.     CSW asked MOB has she selected a pediatrician for the infant's follow up visits; MOB said [facility]. MOB reported having all essential items for the infant including a carseat, bassinet and crib for safe sleeping. CSW provided review of Sudden Infant Death Syndrome (SIDS) precautions.     CSW identifies no further need for intervention and no barriers to discharge at this time.  Enos Fling, Theresia Majors Clinical Social Worker [phone]

## 2023-05-23 NOTE — Progress Notes (Deleted)
Melanie Stout was referred for history of depression/anxiety.  * Referral screened out by Clinical Social Worker because none of the following criteria appear to apply:  ~ History of anxiety/depression during this pregnancy, or of post-partum depression following prior delivery.  ~ Diagnosis of anxiety and/or depression within last 3 years  OR  * Melanie Stout's symptoms currently being treated with medication and/or therapy.  Per OB notes, Melanie Stout is prescribed Sertraline 75mg  for support.  Please contact the Clinical Social Worker if needs arise, by [facility] request, or if Melanie Stout scores greater than 9/yes to question 10 on Edinburgh Postpartum Depression Screen.  Enos Fling, Theresia Majors Clinical Social Worker [phone]

## 2023-05-23 NOTE — Progress Notes (Signed)
Postpartum Progress Note  S: No complaints, pain overall well-controlled. Lochia appropriate. No subjective fevers/chills, Cp, or SOB. Ambulating.   O:     05/23/2023    5:30 AM 05/23/2023    3:45 AM 05/22/2023   11:38 PM  Vitals with BMI  Systolic 127 139 962  Diastolic 59 71 77  Pulse 59 64 62    Gen: NAD, A&O Pulm: NWOB Abd: soft, appropriately ttp, fundus firm and below Umb. Incision c/d under honeycomb Ext: No evidence of DVT, trace edema b/l  UOP adequate.   Labs    05/23/2023    5:30 AM 05/23/2023    3:45 AM 05/22/2023   11:38 PM  Vitals with BMI  Systolic 127 139 952  Diastolic 59 71 77  Pulse 59 64 62     A/P:  POD1 s/p rCS+bTL, doing well pp. AFVSS. Benign exam. 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 Asymptomatic. Continue present care.  Plan for d/c POD#2 or 3.   Jule Economy, MD

## 2023-05-24 MED ORDER — POLYETHYLENE GLYCOL 3350 17 G PO PACK
17.0000 g | PACK | Freq: Every day | ORAL | Status: DC
  Administered 2023-05-24 – 2023-05-25 (×2): 17 g via ORAL
  Filled 2023-05-24 (×2): qty 1

## 2023-05-24 NOTE — Progress Notes (Signed)
Patient desires a belly binder for when she is up moving during the day. She's still having pain in the incision area and abdomen, all the way around her abdomen. She has been encouraged to walk tonight but has not walked in the hallway during night shift.

## 2023-05-24 NOTE — Progress Notes (Signed)
Postpartum Progress Note  S: No complaints, required a few doses of IV pain medication overnight. Lochia appropriate. No subjective fevers/chills, Cp, or SOB. Ambulating. No flatus.  O:     05/24/2023    6:26 AM 05/23/2023   10:45 PM 05/23/2023    9:24 PM  Vitals with BMI  Systolic 138 134 161  Diastolic 73 81 69    Gen: NAD, A&O Pulm: NWOB Abd: soft, appropriately ttp, fundus firm and below Umb. Incision c/d under honeycomb Ext: No evidence of DVT, trace edema b/l   Labs Recent Results (from the past 2160 hours)  Reticulocytes     Status: None   Collection Time: 03/13/23  2:05 PM  Result Value Ref Range   Retic Ct Pct 1.6 0.4 - 3.1 %   RBC. 4.25 3.87 - 5.11 MIL/uL   Retic Count, Absolute 68.0 19.0 - 186.0 K/uL   Immature Retic Fract 13.3 2.3 - 15.9 %    Comment: Performed at [facility] at [facility], [address], [place], [place] [address]  Iron and Iron Binding Capacity ([facility] only)     Status: Abnormal   Collection Time: 03/13/23  2:05 PM  Result Value Ref Range   Iron 86 28 - 170 ug/dL   TIBC 540 (H) 981 - 191 ug/dL   Saturation Ratios 18 10.4 - 31.8 %   UIBC 390 148 - 442 ug/dL    Comment: Performed at [facility], [address], [place], [place] [address]  Ferritin     Status: Abnormal   Collection Time: 03/13/23  2:05 PM  Result Value Ref Range   Ferritin 10 (L) 11 - 307 ng/mL    Comment: Performed at [facility], [address] [place], [place] [address]  CBC with Differential (Cancer Center Only)     Status: Abnormal   Collection Time: 03/13/23  2:05 PM  Result Value Ref Range   WBC Count 7.4 4.0 - 10.5 K/uL   RBC 4.24 3.87 - 5.11 MIL/uL   Hemoglobin 12.0 12.0 - 15.0 g/dL   HCT 08.6 (L) 57.8 - 46.9 %   MCV 83.3 80.0 - 100.0 fL   MCH 28.3 26.0 - 34.0 pg   MCHC 34.0 30.0 - 36.0 g/dL   RDW 62.9 52.8 - 41.3 %   Platelet Count 129 (L) 150 - 400 K/uL    Comment: SPECIMEN  CHECKED FOR CLOTS   nRBC 0.0 0.0 - 0.2 %   Neutrophils Relative % 54 %   Neutro Abs 4.1 1.7 - 7.7 K/uL   Lymphocytes Relative 31 %   Lymphs Abs 2.3 0.7 - 4.0 K/uL   Monocytes Relative 7 %   Monocytes Absolute 0.5 0.1 - 1.0 K/uL   Eosinophils Relative 3 %   Eosinophils Absolute 0.2 0.0 - 0.5 K/uL   Basophils Relative 1 %   Basophils Absolute 0.0 0.0 - 0.1 K/uL   Immature Granulocytes 4 %   Abs Immature Granulocytes 0.28 (H) 0.00 - 0.07 K/uL    Comment: Performed at [facility] at [facility], [address] [place], [place] [address]  OB RESULT CONSOLE Group B Strep     Status: None   Collection Time: 05/06/23 12:00 AM  Result Value Ref Range   GBS Positive   CBC     Status: Abnormal   Collection Time: 05/20/23  9:25 AM  Result Value Ref Range   WBC 6.2  4.0 - 10.5 K/uL   RBC 3.90 3.87 - 5.11 MIL/uL   Hemoglobin 10.7 (L) 12.0 - 15.0 g/dL   HCT 16.1 (L) 09.6 - 04.5 %   MCV 81.8 80.0 - 100.0 fL   MCH 27.4 26.0 - 34.0 pg   MCHC 33.5 30.0 - 36.0 g/dL   RDW 40.9 81.1 - 91.4 %   Platelets 317 150 - 400 K/uL   nRBC 0.0 0.0 - 0.2 %    Comment: Performed at [facility], [address], [place], [place] [address]  RPR     Status: None   Collection Time: 05/20/23  9:25 AM  Result Value Ref Range   RPR Ser Ql NON REACTIVE NON REACTIVE    Comment: Performed at [facility], [address] [place], [place] [address]  Type and screen [facility]     Status: None   Collection Time: 05/20/23  9:33 AM  Result Value Ref Range   ABO/RH(D) O POS    Antibody Screen NEG    Sample Expiration      05/23/2023,2359 Performed at [facility], [address] [place], [place] [address]   CBC     Status: Abnormal   Collection Time: 05/23/23  4:25 AM  Result Value Ref Range   WBC 14.4 (H) 4.0 - 10.5 K/uL   RBC 3.34 (L) 3.87 - 5.11 MIL/uL   Hemoglobin 9.3 (L) 12.0 - 15.0 g/dL   HCT 46.9 (L) 62.9 - 52.8 %   MCV 82.3 80.0 - 100.0 fL    MCH 27.8 26.0 - 34.0 pg   MCHC 33.8 30.0 - 36.0 g/dL   RDW 41.3 24.4 - 01.0 %   Platelets 258 150 - 400 K/uL   nRBC 0.0 0.0 - 0.2 %    Comment: Performed at [facility], [address], [place], [place] [address]  Collect bld for placenta donatation     Status: None   Collection Time: 05/23/23  4:25 AM  Result Value Ref Range   Placenta donation bld collect Collected by Laboratory     Comment: Performed at [facility], [address] [place], [place] [address]      A/P:  POD2 s/p rCS+bTL, doing well pp. AFVSS. Benign exam. 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 Asymptomatic. Working on pain control today. Suspect some of it is gas pain. Getting simethicone. Will schedule Miralax. Likely dc tomorrow.   Jule Economy, MD

## 2023-05-25 LAB — COMPREHENSIVE METABOLIC PANEL
ALT: 10 U/L (ref 0–44)
AST: 13 U/L — ABNORMAL LOW (ref 15–41)
Albumin: 2.2 g/dL — ABNORMAL LOW (ref 3.5–5.0)
Alkaline Phosphatase: 95 U/L (ref 38–126)
Anion gap: 11 (ref 5–15)
BUN: 7 mg/dL (ref 6–20)
CO2: 22 mmol/L (ref 22–32)
Calcium: 8.2 mg/dL — ABNORMAL LOW (ref 8.9–10.3)
Chloride: 104 mmol/L (ref 98–111)
Creatinine, Ser: 0.66 mg/dL (ref 0.44–1.00)
GFR, Estimated: >60 mL/min (ref >60)
Glucose, Bld: 80 mg/dL (ref 70–99)
Potassium: 3.7 mmol/L (ref 3.5–5.1)
Sodium: 137 mmol/L (ref 135–145)
Total Bilirubin: 0.5 mg/dL (ref <1.2)
Total Protein: 4.8 g/dL — ABNORMAL LOW (ref 6.5–8.1)

## 2023-05-25 MED ORDER — POLYETHYLENE GLYCOL 3350 17 G PO PACK: 17.0000 g | PACK | Freq: Every day | ORAL | 0 refills | Status: DC

## 2023-05-25 MED ORDER — OXYCODONE HCL 5 MG PO TABS: 5.0000 mg | ORAL_TABLET | ORAL | 0 refills | Status: DC | PRN

## 2023-05-25 MED ORDER — IBUPROFEN 600 MG PO TABS: 600.0000 mg | ORAL_TABLET | Freq: Four times a day (QID) | ORAL | 0 refills | Status: DC

## 2023-05-25 MED ORDER — SIMETHICONE 80 MG PO CHEW: 80.0000 mg | CHEWABLE_TABLET | Freq: Three times a day (TID) | ORAL | 0 refills | Status: DC

## 2023-05-25 MED ORDER — ACETAMINOPHEN 500 MG PO TABS: 1000.0000 mg | ORAL_TABLET | Freq: Four times a day (QID) | ORAL | 0 refills | Status: DC

## 2023-05-25 MED ORDER — LABETALOL HCL 200 MG PO TABS: 200.0000 mg | ORAL_TABLET | Freq: Two times a day (BID) | ORAL | 1 refills | Status: DC

## 2023-05-25 MED ORDER — LABETALOL HCL 200 MG PO TABS
200.0000 mg | ORAL_TABLET | Freq: Two times a day (BID) | ORAL | Status: DC
  Administered 2023-05-25: 200 mg via ORAL
  Filled 2023-05-25: qty 1

## 2023-05-25 NOTE — Discharge Summary (Signed)
Postpartum Discharge Summary  Date of Service updated 05/25/2023     Patient Name: Melanie Stout DOB: [birth year] MRN: [id number]  Date of admission: 05/22/2023 Delivery date:05/22/2023 Delivering provider: Harold Hedge Date of discharge: 05/25/2023  Admitting diagnosis: Term pregnancy [Z34.90] Intrauterine pregnancy: [redacted]w[redacted]d     Secondary diagnosis:  Principal Problem:   Term pregnancy  Additional problems: gestational hypertension, acute blood loss anemia   Discharge diagnosis: Term Pregnancy Delivered, Gestational Hypertension, and Anemia                                              Post partum procedures: none Augmentation: N/A Complications: None  Hospital course: Scheduled C/S   37 y.o. yo [id number] at [redacted]w[redacted]d was admitted to the hospital 05/22/2023 for scheduled cesarean section with the following indication:Prior Uterine Surgery.Delivery details are as follows:  Membrane Rupture Time/Date: 8:07 AM,05/22/2023  Delivery Method:C-Section, Low Transverse Operative Delivery:N/A Details of operation can be found in separate operative note.  Patient had a postpartum course complicated by mild range Bps on POD3. She never had any headaches, vision changes, or RUQ pain and labs without evidence of HELLP. She was started on labetalol 200mg  BID.  She is ambulating, tolerating a regular diet, passing flatus, and urinating well. Patient is discharged home in stable condition on  05/25/23        Newborn Data: Birth date:05/22/2023 Birth time:8:07 AM Gender:Female Living status:Living Apgars:8 ,9  Weight:3900 g    Magnesium Sulfate received: No BMZ received: No Rhophylac:N/A MMR:N/A T-DaP:Given prenatally Flu: Yes Transfusion:No Immunizations administered: Immunization History  Administered Date(s) Administered   Influenza,inj,Quad PF,6+ Mos 05/16/2021   PFIZER(Purple Top)SARS-COV-2 Vaccination 10/06/2019, 11/06/2019   Pneumococcal Polysaccharide-23 01/08/2013    Tdap 09/21/2012, 12/24/2021    Physical exam  Vitals:   05/24/23 1459 05/24/23 2155 05/25/23 0026 05/25/23 0624  BP: 130/75 139/87 (!) 147/84 (!) 143/74  Pulse: 75 73 66 69  Resp: 17     Temp: 97.8 F (36.6 C) 98 F (36.7 C)  97.9 F (36.6 C)  TempSrc: Oral Oral  Oral  SpO2: 100% 100% 100% 100%  Weight:      Height:       General: alert, cooperative, and no distress Lochia: appropriate Uterine Fundus: firm Incision: Healing well with no significant drainage DVT Evaluation: No evidence of DVT seen on physical exam. Labs: Lab Results  Component Value Date   WBC 14.4 (H) 05/23/2023   HGB 9.3 (L) 05/23/2023   HCT 27.5 (L) 05/23/2023   MCV 82.3 05/23/2023   PLT 258 05/23/2023      Latest Ref Rng & Units 05/25/2023    8:13 AM  CMP  Glucose 70 - 99 mg/dL 80   BUN 6 - 20 mg/dL 7   Creatinine 8.41 - 3.24 mg/dL 4.01   Sodium 027 - 253 mmol/L 137   Potassium 3.5 - 5.1 mmol/L 3.7   Chloride 98 - 111 mmol/L 104   CO2 22 - 32 mmol/L 22   Calcium 8.9 - 10.3 mg/dL 8.2   Total Protein 6.5 - 8.1 g/dL 4.8   Total Bilirubin <6.6 mg/dL 0.5   Alkaline Phos 38 - 126 U/L 95   AST 15 - 41 U/L 13   ALT 0 - 44 U/L 10    Edinburgh Score:    05/23/2023    5:55 PM  Inocente Salles  Postnatal Depression Scale Screening Tool  I have been able to laugh and see the funny side of things. 1  I have looked forward with enjoyment to things. 1  I have blamed myself unnecessarily when things went wrong. 1  I have been anxious or worried for no good reason. 1  I have felt scared or panicky for no good reason. 2  Things have been getting on top of me. 2  I have been so unhappy that I have had difficulty sleeping. 2  I have felt sad or miserable. 1  I have been so unhappy that I have been crying. 1  The thought of harming myself has occurred to me. 0  Edinburgh Postnatal Depression Scale Total 12      After visit meds:  Allergies as of 05/25/2023   No Known Allergies      Medication List      TAKE these medications    acetaminophen 500 MG tablet Commonly known as: TYLENOL Take 2 tablets (1,000 mg total) by mouth every 6 (six) hours.   cyclobenzaprine 10 MG tablet Commonly known as: FLEXERIL Take 0.5-1 tablets (5-10 mg total) by mouth 2 (two) times daily as needed for muscle spasms.   famotidine 20 MG tablet Commonly known as: PEPCID Take 20 mg by mouth daily as needed for heartburn or indigestion.   folic acid 1 MG tablet Commonly known as: FOLVITE Take 1 tablet (1 mg total) by mouth daily.   ibuprofen 600 MG tablet Commonly known as: ADVIL Take 1 tablet (600 mg total) by mouth every 6 (six) hours.   labetalol 200 MG tablet Commonly known as: NORMODYNE Take 1 tablet (200 mg total) by mouth 2 (two) times daily.   oxyCODONE 5 MG immediate release tablet Commonly known as: Oxy IR/ROXICODONE Take 1 tablet (5 mg total) by mouth every 4 (four) hours as needed for moderate pain (pain score 4-6).   pantoprazole 40 MG tablet Commonly known as: PROTONIX Take 1 tablet (40 mg total) by mouth daily. What changed:  when to take this reasons to take this   polyethylene glycol 17 g packet Commonly known as: MIRALAX / GLYCOLAX Take 17 g by mouth daily.   sertraline 25 MG tablet Commonly known as: ZOLOFT Take 75 mg by mouth daily.   simethicone 80 MG chewable tablet Commonly known as: MYLICON Chew 1 tablet (80 mg total) by mouth 3 (three) times daily after meals.   valACYclovir 500 MG tablet Commonly known as: VALTREX Take 500 mg by mouth daily.         Discharge home in stable condition Infant Feeding: Bottle and Breast Infant Disposition:home with mother Discharge instruction: per After Visit Summary and Postpartum booklet. Activity: Advance as tolerated. Pelvic rest for 6 weeks.  Diet: routine diet Anticipated Birth Control: s/p BTL at time of c-section Postpartum Appointment:6 weeks Additional Postpartum F/U: BP check 1 week Future  Appointments: Future Appointments  Date Time Provider Department Center  07/15/2023  2:00 PM [facility] None  07/15/2023  2:30 PM Rushie Chestnut, PA-C [facility] None    05/25/2023 Tawni Levy, MD

## 2023-05-27 LAB — SURGICAL PATHOLOGY

## 2023-05-30 ENCOUNTER — Telehealth (HOSPITAL_COMMUNITY): Payer: Self-pay | Admitting: *Deleted

## 2023-05-30 NOTE — Telephone Encounter (Signed)
05/30/2023  Name: Melanie Stout MRN: [id number] DOB: March 09, 1986  Reason for Call:  Transition of Care Hospital Discharge Call  Contact Status: Patient Contact Status: Message  Language assistant needed:          Follow-Up Questions:    Inocente Salles Postnatal Depression Scale:  In the Past 7 Days:    PHQ2-9 Depression Scale:     Discharge Follow-up:    Post-discharge interventions: NA  Salena Saner, RN 05/30/2023 10:45

## 2023-06-11 ENCOUNTER — Telehealth: Payer: Self-pay | Admitting: *Deleted

## 2023-06-11 NOTE — Telephone Encounter (Signed)
 Patient called stating that she was at the [facility] office and her hemoglobin was noted to be low.  She is calling to set up iron infusions.  Appt made for Monday for lab and Clent Jacks PA.

## 2023-06-16 ENCOUNTER — Inpatient Hospital Stay: Payer: 59 | Attending: Family Medicine

## 2023-06-16 ENCOUNTER — Inpatient Hospital Stay: Payer: 59 | Admitting: Medical Oncology

## 2023-07-15 ENCOUNTER — Inpatient Hospital Stay: Payer: 59 | Attending: Family Medicine

## 2023-07-15 ENCOUNTER — Inpatient Hospital Stay (HOSPITAL_BASED_OUTPATIENT_CLINIC_OR_DEPARTMENT_OTHER): Payer: 59 | Admitting: Medical Oncology

## 2023-07-15 ENCOUNTER — Encounter: Payer: Self-pay | Admitting: Medical Oncology

## 2023-07-15 VITALS — BP 142/91 | HR 58 | Temp 98.3°F | Resp 18 | Ht 66.0 in | Wt 255.1 lb

## 2023-07-15 DIAGNOSIS — R7989 Other specified abnormal findings of blood chemistry: Secondary | ICD-10-CM | POA: Diagnosis not present

## 2023-07-15 DIAGNOSIS — D500 Iron deficiency anemia secondary to blood loss (chronic): Secondary | ICD-10-CM

## 2023-07-15 DIAGNOSIS — D563 Thalassemia minor: Secondary | ICD-10-CM | POA: Insufficient documentation

## 2023-07-15 DIAGNOSIS — Z79899 Other long term (current) drug therapy: Secondary | ICD-10-CM | POA: Diagnosis not present

## 2023-07-15 DIAGNOSIS — N920 Excessive and frequent menstruation with regular cycle: Secondary | ICD-10-CM | POA: Insufficient documentation

## 2023-07-15 LAB — CBC
HCT: 36.7 % (ref 36.0–46.0)
Hemoglobin: 11.7 g/dL — ABNORMAL LOW (ref 12.0–15.0)
MCH: 26 pg (ref 26.0–34.0)
MCHC: 31.9 g/dL (ref 30.0–36.0)
MCV: 81.6 fL (ref 80.0–100.0)
Platelets: 355 10*3/uL (ref 150–400)
RBC: 4.5 MIL/uL (ref 3.87–5.11)
RDW: 14 % (ref 11.5–15.5)
WBC: 5 10*3/uL (ref 4.0–10.5)
nRBC: 0 % (ref 0.0–0.2)

## 2023-07-15 LAB — FERRITIN: Ferritin: 3 ng/mL — ABNORMAL LOW (ref 11–307)

## 2023-07-15 LAB — VITAMIN B12: Vitamin B-12: 307 pg/mL (ref 180–914)

## 2023-07-15 NOTE — Progress Notes (Signed)
Hematology and Oncology Follow Up Visit  Melanie Stout [id number] September 19, 1985 38 y.o. 07/15/2023   Principle Diagnosis:  Iron deficiency anemia  Alpha thalassemia minor trait  Current Therapy:   IV iron as indicated - oral iron on hold  Folic acid 1 mg daily PO- she is not currently taking this.    Interim History:  [name] is here today for follow-up.   Today she reports that she has been well since her last visit. She has had some fatigue and mild SOB with exertion. She is having a bit of pica.  Cycles are irregular. At the end of Des she had a baby girl named Melanie Stout via cesarean section. Her BP is up a bit since birth. She has been keeping a close eye on her BP and today's reading is similar to what she has been getting at home. She has follow up with OB-GYN for follow up. No chest pains. Mild intermittent headaches- none currently  She is not breastfeeding.  No blood loss noted. No bruising or petechiae.  No fever, chills, n/v, cough, rash, chest pain, abdominal pain or changes in bladder habits.  No swelling, numbness or tingling in her extremities at this time.  No falls or syncope.  Appetite and hydration are good.  Wt Readings from Last 3 Encounters:  07/15/23 255 lb 1.9 oz (115.7 kg)  05/22/23 271 lb 12.8 oz (123.3 kg)  05/16/23 272 lb (123.4 kg)   ECOG Performance Status: 1 - Symptomatic but completely ambulatory  Medications:  Allergies as of 07/15/2023   No Known Allergies      Medication List        Accurate as of July 15, 2023  3:04 PM. If you have any questions, ask your nurse or doctor.          STOP taking these medications    acetaminophen 500 MG tablet Commonly known as: TYLENOL Stopped by: Rushie Chestnut   cyclobenzaprine 10 MG tablet Commonly known as: FLEXERIL Stopped by: Rushie Chestnut   oxyCODONE 5 MG immediate release tablet Commonly known as: Oxy IR/ROXICODONE Stopped by: Rushie Chestnut    polyethylene glycol 17 g packet Commonly known as: MIRALAX / GLYCOLAX Stopped by: Rushie Chestnut       TAKE these medications    famotidine 20 MG tablet Commonly known as: PEPCID Take 20 mg by mouth daily as needed for heartburn or indigestion.   folic acid 1 MG tablet Commonly known as: FOLVITE Take 1 tablet (1 mg total) by mouth daily.   ibuprofen 600 MG tablet Commonly known as: ADVIL Take 1 tablet (600 mg total) by mouth every 6 (six) hours.   labetalol 200 MG tablet Commonly known as: NORMODYNE Take 1 tablet (200 mg total) by mouth 2 (two) times daily.   pantoprazole 40 MG tablet Commonly known as: PROTONIX Take 1 tablet (40 mg total) by mouth daily. What changed:  when to take this reasons to take this   sertraline 25 MG tablet Commonly known as: ZOLOFT Take 75 mg by mouth daily.   simethicone 80 MG chewable tablet Commonly known as: MYLICON Chew 1 tablet (80 mg total) by mouth 3 (three) times daily after meals.   valACYclovir 500 MG tablet Commonly known as: VALTREX Take 500 mg by mouth daily.        Allergies: No Known Allergies  Past Medical History, Surgical history, Social history, and Family History were reviewed and updated.  Review of Systems: All other  10 point review of systems is negative.   Physical Exam:  height is 5\' 6"  (1.676 m) and weight is 255 lb 1.9 oz (115.7 kg). Her oral temperature is 98.3 F (36.8 C). Her blood pressure is 142/91 (abnormal) and her pulse is 58 (abnormal). Her respiration is 18 and oxygen saturation is 100%.   Wt Readings from Last 3 Encounters:  07/15/23 255 lb 1.9 oz (115.7 kg)  05/22/23 271 lb 12.8 oz (123.3 kg)  05/16/23 272 lb (123.4 kg)    Ocular: Sclerae unicteric, pupils equal, round and reactive to light Ear-nose-throat: Oropharynx clear, dentition fair Lymphatic: No cervical or supraclavicular adenopathy Lungs no rales or rhonchi, good excursion bilaterally Heart regular rate and rhythm,  no murmur appreciated Abd soft, nontender, positive bowel sounds MSK no focal spinal tenderness, no joint edema Neuro: non-focal, well-oriented, appropriate affect   Lab Results  Component Value Date   WBC 5.0 07/15/2023   HGB 11.7 (L) 07/15/2023   HCT 36.7 07/15/2023   MCV 81.6 07/15/2023   PLT 355 07/15/2023   Lab Results  Component Value Date   FERRITIN 10 (L) 03/13/2023   IRON 86 03/13/2023   TIBC 476 (H) 03/13/2023   UIBC 390 03/13/2023   IRONPCTSAT 18 03/13/2023   Lab Results  Component Value Date   RETICCTPCT 1.6 03/13/2023   RBC 4.50 07/15/2023   No results found for: "KPAFRELGTCHN", "LAMBDASER", "KAPLAMBRATIO" No results found for: "IGGSERUM", "IGA", "IGMSERUM" No results found for: "TOTALPROTELP", "ALBUMINELP", "A1GS", "A2GS", "BETS", "BETA2SER", "GAMS", "MSPIKE", "SPEI"   Chemistry      Component Value Date/Time   NA 137 05/25/2023 0813   K 3.7 05/25/2023 0813   CL 104 05/25/2023 0813   CO2 22 05/25/2023 0813   BUN 7 05/25/2023 0813   CREATININE 0.66 05/25/2023 0813   CREATININE 0.77 08/02/2022 1456   CREATININE 0.70 12/13/2015 1104      Component Value Date/Time   CALCIUM 8.2 (L) 05/25/2023 0813   ALKPHOS 95 05/25/2023 0813   AST 13 (L) 05/25/2023 0813   AST 14 (L) 08/02/2022 1456   ALT 10 05/25/2023 0813   ALT 12 08/02/2022 1456   BILITOT 0.5 05/25/2023 0813   BILITOT 0.2 (L) 08/02/2022 1456      Encounter Diagnosis  Name Primary?   Iron deficiency anemia due to chronic blood loss Yes   Impression and Plan: [name] is a very pleasant 38 yo African American female with history of iron deficiency anemia secondary to heavy cycle as well as the alpha thalassemia minor trait.   Today her Hgb is 11.7 which is up from 9.3 after the birth of her daughter.  I am going to add on a B12 today due to history of low B12 and fatigue.  I have encouraged her to follow a lower salt diet and keep a close eye on her BP. I have also encouraged her to keep  her visit scheduled for next week with her OB-GYN.  Iron studies are pending. We will replace if needed.  RTC 3 months APP, labs (CBC, retic, iron, ferritin, B12)  Rushie Chestnut, PA-C 2/11/20253:04 PM

## 2023-07-16 ENCOUNTER — Encounter: Payer: Self-pay | Admitting: Family Medicine

## 2023-07-16 LAB — IRON AND IRON BINDING CAPACITY (CC-WL,HP ONLY)
Iron: 27 ug/dL — ABNORMAL LOW (ref 28–170)
Saturation Ratios: 6 % — ABNORMAL LOW (ref 10.4–31.8)
TIBC: 489 ug/dL — ABNORMAL HIGH (ref 250–450)
UIBC: 462 ug/dL — ABNORMAL HIGH (ref 148–442)

## 2023-07-17 ENCOUNTER — Other Ambulatory Visit: Payer: Self-pay | Admitting: Medical Oncology

## 2023-07-17 ENCOUNTER — Encounter: Payer: Self-pay | Admitting: Medical Oncology

## 2023-07-23 ENCOUNTER — Telehealth: Payer: Self-pay | Admitting: Hematology & Oncology

## 2023-07-23 NOTE — Telephone Encounter (Signed)
Called to schedule IV Iron. Unable to LVM for callback.
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
## 2023-08-05 ENCOUNTER — Inpatient Hospital Stay: Payer: 59 | Attending: Family Medicine

## 2023-08-05 VITALS — BP 159/81 | HR 59 | Temp 98.2°F | Resp 18

## 2023-08-05 DIAGNOSIS — N920 Excessive and frequent menstruation with regular cycle: Secondary | ICD-10-CM | POA: Diagnosis present

## 2023-08-05 DIAGNOSIS — D500 Iron deficiency anemia secondary to blood loss (chronic): Secondary | ICD-10-CM | POA: Diagnosis present

## 2023-08-05 MED ORDER — SODIUM CHLORIDE 0.9 % IV SOLN: Freq: Once | INTRAVENOUS | Status: AC

## 2023-08-05 MED ORDER — SODIUM CHLORIDE 0.9 % IV SOLN
300.0000 mg | Freq: Once | INTRAVENOUS | Status: AC
  Administered 2023-08-05: 300 mg via INTRAVENOUS
  Filled 2023-08-05: qty 300

## 2023-08-05 NOTE — Patient Instructions (Signed)

## 2023-08-06 ENCOUNTER — Encounter: Payer: Self-pay | Admitting: Family Medicine

## 2023-08-06 ENCOUNTER — Ambulatory Visit: Payer: 59 | Admitting: Family Medicine

## 2023-08-06 ENCOUNTER — Ambulatory Visit (HOSPITAL_BASED_OUTPATIENT_CLINIC_OR_DEPARTMENT_OTHER)
Admission: RE | Admit: 2023-08-06 | Discharge: 2023-08-06 | Disposition: A | Discharge status: Home / Self Care | Source: Ambulatory Visit | Attending: Family Medicine | Admitting: Family Medicine

## 2023-08-06 VITALS — BP 124/80 | HR 81 | Temp 97.6°F | Resp 18 | Ht 66.0 in | Wt 264.8 lb

## 2023-08-06 DIAGNOSIS — Z1329 Encounter for screening for other suspected endocrine disorder: Secondary | ICD-10-CM

## 2023-08-06 DIAGNOSIS — Z13220 Encounter for screening for lipoid disorders: Secondary | ICD-10-CM

## 2023-08-06 DIAGNOSIS — D649 Anemia, unspecified: Secondary | ICD-10-CM | POA: Diagnosis not present

## 2023-08-06 DIAGNOSIS — M533 Sacrococcygeal disorders, not elsewhere classified: Secondary | ICD-10-CM | POA: Diagnosis present

## 2023-08-06 DIAGNOSIS — R790 Abnormal level of blood mineral: Secondary | ICD-10-CM

## 2023-08-06 DIAGNOSIS — Z131 Encounter for screening for diabetes mellitus: Secondary | ICD-10-CM

## 2023-08-06 DIAGNOSIS — L732 Hidradenitis suppurativa: Secondary | ICD-10-CM | POA: Diagnosis not present

## 2023-08-06 DIAGNOSIS — R030 Elevated blood-pressure reading, without diagnosis of hypertension: Secondary | ICD-10-CM

## 2023-08-06 DIAGNOSIS — Z0000 Encounter for general adult medical examination without abnormal findings: Secondary | ICD-10-CM

## 2023-08-06 LAB — CBC
HCT: 34.3 % — ABNORMAL LOW (ref 36.0–46.0)
Hemoglobin: 11.1 g/dL — ABNORMAL LOW (ref 12.0–15.0)
MCHC: 32.3 g/dL (ref 30.0–36.0)
MCV: 79.4 fl (ref 78.0–100.0)
Platelets: 405 10*3/uL — ABNORMAL HIGH (ref 150.0–400.0)
RBC: 4.33 Mil/uL (ref 3.87–5.11)
RDW: 14.6 % (ref 11.5–15.5)
WBC: 5.3 10*3/uL (ref 4.0–10.5)

## 2023-08-06 LAB — LIPID PANEL
Cholesterol: 160 mg/dL (ref 0–200)
HDL: 49.6 mg/dL (ref >39.00)
LDL Cholesterol: 81 mg/dL (ref 0–99)
NonHDL: 110.88
Total CHOL/HDL Ratio: 3
Triglycerides: 148 mg/dL (ref 0.0–149.0)
VLDL: 29.6 mg/dL (ref 0.0–40.0)

## 2023-08-06 LAB — COMPREHENSIVE METABOLIC PANEL
ALT: 11 U/L (ref 0–35)
AST: 12 U/L (ref 0–37)
Albumin: 4 g/dL (ref 3.5–5.2)
Alkaline Phosphatase: 99 U/L (ref 39–117)
BUN: 12 mg/dL (ref 6–23)
CO2: 27 meq/L (ref 19–32)
Calcium: 9.1 mg/dL (ref 8.4–10.5)
Chloride: 107 meq/L (ref 96–112)
Creatinine, Ser: 0.83 mg/dL (ref 0.40–1.20)
GFR: 89.05 mL/min (ref >60.00)
Glucose, Bld: 97 mg/dL (ref 70–99)
Potassium: 3.9 meq/L (ref 3.5–5.1)
Sodium: 139 meq/L (ref 135–145)
Total Bilirubin: 0.2 mg/dL (ref 0.2–1.2)
Total Protein: 6.5 g/dL (ref 6.0–8.3)

## 2023-08-06 LAB — FERRITIN: Ferritin: 90.7 ng/mL (ref 10.0–291.0)

## 2023-08-06 LAB — TSH: TSH: 0.58 u[IU]/mL (ref 0.35–5.50)

## 2023-08-06 LAB — HEMOGLOBIN A1C: Hgb A1c MFr Bld: 5 % (ref 4.6–6.5)

## 2023-08-06 MED ORDER — LOSARTAN POTASSIUM 50 MG PO TABS: 50.0000 mg | ORAL_TABLET | Freq: Every day | ORAL | 3 refills | Status: DC

## 2023-08-06 NOTE — Patient Instructions (Signed)
 It was good to see you today, congratulations on the new baby!   Please head to lab and then downstairs to have your x-ray, I will be in touch with results as soon as possible  Lets change over from labetalol to losartan for BP control  To start, take a half labetalol twice daily and a half losartan pill once daily -In other words, labetalol 100 twice a day and losartan 25 mg daily  After 5 days cut the labetalol back to once a day, continue losartan 25  After another 5 days, stop labetalol and increase losartan to 50  Let me know how this seems to be working for you We will get your TB screening so hopefully it will be easier to restart your Humira

## 2023-08-09 ENCOUNTER — Encounter: Payer: Self-pay | Admitting: Family Medicine

## 2023-08-09 LAB — QUANTIFERON-TB GOLD PLUS
Mitogen-NIL: 8.56 [IU]/mL
NIL: 0.01 [IU]/mL
QuantiFERON-TB Gold Plus: NEGATIVE
TB1-NIL: 0.01 [IU]/mL
TB2-NIL: 0.01 [IU]/mL

## 2023-08-12 ENCOUNTER — Inpatient Hospital Stay: Payer: 59

## 2023-08-12 VITALS — BP 157/86 | HR 61 | Temp 98.3°F | Resp 16

## 2023-08-12 DIAGNOSIS — D500 Iron deficiency anemia secondary to blood loss (chronic): Secondary | ICD-10-CM

## 2023-08-12 MED ORDER — SODIUM CHLORIDE 0.9 % IV SOLN
300.0000 mg | Freq: Once | INTRAVENOUS | Status: AC
  Administered 2023-08-12: 300 mg via INTRAVENOUS
  Filled 2023-08-12: qty 300

## 2023-08-12 MED ORDER — SODIUM CHLORIDE 0.9 % IV SOLN: Freq: Once | INTRAVENOUS | Status: AC

## 2023-08-12 NOTE — Patient Instructions (Signed)

## 2023-08-19 ENCOUNTER — Inpatient Hospital Stay: Payer: 59

## 2023-08-19 VITALS — BP 160/88 | HR 63 | Resp 20

## 2023-08-19 DIAGNOSIS — D500 Iron deficiency anemia secondary to blood loss (chronic): Secondary | ICD-10-CM

## 2023-08-19 MED ORDER — SODIUM CHLORIDE 0.9 % IV SOLN
300.0000 mg | Freq: Once | INTRAVENOUS | Status: AC
  Administered 2023-08-19: 300 mg via INTRAVENOUS
  Filled 2023-08-19: qty 300

## 2023-08-19 MED ORDER — SODIUM CHLORIDE 0.9 % IV SOLN: Freq: Once | INTRAVENOUS | Status: AC

## 2023-08-19 NOTE — Patient Instructions (Signed)

## 2023-08-27 ENCOUNTER — Encounter: Payer: Self-pay | Admitting: Family Medicine

## 2023-08-27 DIAGNOSIS — M461 Sacroiliitis, not elsewhere classified: Secondary | ICD-10-CM

## 2023-10-14 ENCOUNTER — Inpatient Hospital Stay: Payer: 59 | Attending: Family Medicine | Admitting: Medical Oncology

## 2023-10-14 ENCOUNTER — Inpatient Hospital Stay: Payer: 59

## 2023-10-14 ENCOUNTER — Encounter: Payer: Self-pay | Admitting: Medical Oncology

## 2023-10-14 VITALS — BP 131/85 | HR 65 | Temp 98.3°F | Resp 18 | Ht 66.0 in | Wt 261.8 lb

## 2023-10-14 DIAGNOSIS — N920 Excessive and frequent menstruation with regular cycle: Secondary | ICD-10-CM | POA: Diagnosis present

## 2023-10-14 DIAGNOSIS — D500 Iron deficiency anemia secondary to blood loss (chronic): Secondary | ICD-10-CM

## 2023-10-14 DIAGNOSIS — Z79899 Other long term (current) drug therapy: Secondary | ICD-10-CM | POA: Diagnosis not present

## 2023-10-14 DIAGNOSIS — D563 Thalassemia minor: Secondary | ICD-10-CM | POA: Diagnosis not present

## 2023-10-14 DIAGNOSIS — R7989 Other specified abnormal findings of blood chemistry: Secondary | ICD-10-CM | POA: Diagnosis not present

## 2023-10-14 LAB — RETIC PANEL
Immature Retic Fract: 12.6 % (ref 2.3–15.9)
RBC.: 4.83 MIL/uL (ref 3.87–5.11)
Retic Count, Absolute: 97.1 10*3/uL (ref 19.0–186.0)
Retic Ct Pct: 2 % (ref 0.4–3.1)
Reticulocyte Hemoglobin: 30.9 pg (ref >27.9)

## 2023-10-14 LAB — CBC
HCT: 37.6 % (ref 36.0–46.0)
Hemoglobin: 12.3 g/dL (ref 12.0–15.0)
MCH: 26.3 pg (ref 26.0–34.0)
MCHC: 32.7 g/dL (ref 30.0–36.0)
MCV: 80.5 fL (ref 80.0–100.0)
Platelets: 342 10*3/uL (ref 150–400)
RBC: 4.67 MIL/uL (ref 3.87–5.11)
RDW: 16.1 % — ABNORMAL HIGH (ref 11.5–15.5)
WBC: 5.7 10*3/uL (ref 4.0–10.5)
nRBC: 0 % (ref 0.0–0.2)

## 2023-10-14 LAB — VITAMIN B12: Vitamin B-12: 307 pg/mL (ref 180–914)

## 2023-10-14 LAB — FERRITIN: Ferritin: 35 ng/mL (ref 11–307)

## 2023-10-14 NOTE — Progress Notes (Signed)
 Hematology and Oncology Follow Up Visit  Melanie Stout [id number] 18-May-1986 38 y.o. 10/14/2023   Principle Diagnosis:  Iron  deficiency anemia  Alpha thalassemia minor trait  Current Therapy:   IV iron  as indicated - oral iron  on hold due to ineffectiveness  Folic acid  1 mg daily PO- she is not currently taking this.    Interim History:  Melanie Stout is here today for follow-up.   Today she reports that she has been well since her last visit.  She has had some fatigue and mild SOB with exertion. She is having a bit of pica.  Cycles have been heavy She is not breastfeeding.  No blood loss noted. No bruising or petechiae.  No fever, chills, n/v, cough, rash, chest pain, abdominal pain or changes in bladder habits.  No swelling, numbness or tingling in her extremities at this time.  No falls or syncope.  Appetite and hydration are good.  Wt Readings from Last 3 Encounters:  10/14/23 261 lb 12.8 oz (118.8 kg)  08/06/23 264 lb 12.8 oz (120.1 kg)  07/15/23 255 lb 1.9 oz (115.7 kg)   ECOG Performance Status: 1 - Symptomatic but completely ambulatory  Medications:  Allergies as of 10/14/2023   No Known Allergies      Medication List        Accurate as of Oct 14, 2023  3:21 PM. If you have any questions, ask your nurse or doctor.          clobetasol ointment 0.05 % Commonly known as: TEMOVATE APPLY  OINTMENT TOPICALLY TWICE DAILY FOR 14 DAYS for 10   famotidine 20 MG tablet Commonly known as: PEPCID Take 20 mg by mouth daily as needed for heartburn or indigestion.   folic acid  1 MG tablet Commonly known as: FOLVITE  Take 1 tablet (1 mg total) by mouth daily.   ibuprofen  600 MG tablet Commonly known as: ADVIL  Take 1 tablet (600 mg total) by mouth every 6 (six) hours.   losartan  50 MG tablet Commonly known as: COZAAR  Take 1 tablet (50 mg total) by mouth daily.   pantoprazole  40 MG tablet Commonly known as: PROTONIX  Take 1 tablet (40 mg total)  by mouth daily. What changed:  when to take this reasons to take this   sertraline  25 MG tablet Commonly known as: ZOLOFT  Take 75 mg by mouth daily.   simethicone  80 MG chewable tablet Commonly known as: MYLICON Chew 1 tablet (80 mg total) by mouth 3 (three) times daily after meals.   valACYclovir  500 MG tablet Commonly known as: VALTREX  Take 500 mg by mouth daily.        Allergies: No Known Allergies  Past Medical History, Surgical history, Social history, and Family History were reviewed and updated.  Review of Systems: All other 10 point review of systems is negative.   Physical Exam:  height is 5\' 6"  (1.676 m) and weight is 261 lb 12.8 oz (118.8 kg). Her oral temperature is 98.3 F (36.8 C). Her blood pressure is 131/85 and her pulse is 65. Her respiration is 18 and oxygen saturation is 100%.   Wt Readings from Last 3 Encounters:  10/14/23 261 lb 12.8 oz (118.8 kg)  08/06/23 264 lb 12.8 oz (120.1 kg)  07/15/23 255 lb 1.9 oz (115.7 kg)    Ocular: Sclerae unicteric, pupils equal, round and reactive to light Ear-nose-throat: Oropharynx clear, dentition fair Lymphatic: No cervical or supraclavicular adenopathy Lungs no rales or rhonchi, good excursion bilaterally Heart regular rate and  rhythm, no murmur appreciated Abd soft, nontender, positive bowel sounds MSK no focal spinal tenderness, no joint edema Neuro: non-focal, well-oriented, appropriate affect   Lab Results  Component Value Date   WBC 5.7 10/14/2023   HGB 12.3 10/14/2023   HCT 37.6 10/14/2023   MCV 80.5 10/14/2023   PLT 342 10/14/2023   Lab Results  Component Value Date   FERRITIN 90.7 08/06/2023   IRON  27 (L) 07/15/2023   TIBC 489 (H) 07/15/2023   UIBC 462 (H) 07/15/2023   IRONPCTSAT 6 (L) 07/15/2023   Lab Results  Component Value Date   RETICCTPCT 2.0 10/14/2023   RBC 4.67 10/14/2023   RBC 4.83 10/14/2023   No results found for: "KPAFRELGTCHN", "LAMBDASER", "KAPLAMBRATIO" No results  found for: "IGGSERUM", "IGA", "IGMSERUM" No results found for: "TOTALPROTELP", "ALBUMINELP", "A1GS", "A2GS", "BETS", "BETA2SER", "GAMS", "MSPIKE", "SPEI"   Chemistry      Component Value Date/Time   NA 139 08/06/2023 1410   K 3.9 08/06/2023 1410   CL 107 08/06/2023 1410   CO2 27 08/06/2023 1410   BUN 12 08/06/2023 1410   CREATININE 0.83 08/06/2023 1410   CREATININE 0.77 08/02/2022 1456   CREATININE 0.70 12/13/2015 1104      Component Value Date/Time   CALCIUM 9.1 08/06/2023 1410   ALKPHOS 99 08/06/2023 1410   AST 12 08/06/2023 1410   AST 14 (L) 08/02/2022 1456   ALT 11 08/06/2023 1410   ALT 12 08/02/2022 1456   BILITOT 0.2 08/06/2023 1410   BILITOT 0.2 (L) 08/02/2022 1456      Encounter Diagnoses  Name Primary?   Iron  deficiency anemia due to chronic blood loss Yes   Low vitamin B12 level     Impression and Plan: Melanie Stout is a very pleasant 38 yo [place] American female with history of iron  deficiency anemia secondary to heavy cycle as well as the alpha thalassemia minor trait.   Today her Hgb is 12.3 which is up from 9.3 after the birth of her daughter.  Iron  studies are pending. We will replace if needed.  She will talk with GYN regarding her heavy menstruation  RTC 3 months APP, labs (CBC, retic, iron , ferritin, B12)  Sharla Davis, PA-C 5/13/20253:21 PM

## 2023-10-15 ENCOUNTER — Ambulatory Visit: Payer: Self-pay | Admitting: Medical Oncology

## 2023-10-15 ENCOUNTER — Other Ambulatory Visit: Payer: Self-pay | Admitting: Medical Oncology

## 2023-10-15 LAB — IRON AND IRON BINDING CAPACITY (CC-WL,HP ONLY)
Iron: 50 ug/dL (ref 28–170)
Saturation Ratios: 13 % (ref 10.4–31.8)
TIBC: 375 ug/dL (ref 250–450)
UIBC: 325 ug/dL (ref 148–442)

## 2023-10-15 NOTE — Progress Notes (Signed)
 Melanie Stout

## 2023-10-17 ENCOUNTER — Telehealth: Payer: Self-pay | Admitting: Medical Oncology

## 2023-10-17 NOTE — Telephone Encounter (Signed)
 Left Message - Lvm for patient to return call for schdeduling 3 doses of iv iron .

## 2023-11-21 ENCOUNTER — Telehealth: Payer: Self-pay | Admitting: Family Medicine

## 2023-11-21 NOTE — Telephone Encounter (Signed)
 Copied from CRM [id number]. Topic: General - Other >> Nov 21, 2023  2:37 PM Chuck Crater wrote: Reason for CRM: Prentice Brochure is requesting a callback from referral coordinator.  Prentice Brochure also stated that patient's [employer] Authorization has expired

## 2023-11-25 ENCOUNTER — Telehealth (HOSPITAL_BASED_OUTPATIENT_CLINIC_OR_DEPARTMENT_OTHER): Payer: Self-pay

## 2023-11-26 ENCOUNTER — Ambulatory Visit (HOSPITAL_BASED_OUTPATIENT_CLINIC_OR_DEPARTMENT_OTHER)

## 2023-11-28 NOTE — Telephone Encounter (Signed)
 Referral shara has been re-newed.

## 2023-12-01 ENCOUNTER — Inpatient Hospital Stay: Attending: Family Medicine

## 2023-12-08 ENCOUNTER — Inpatient Hospital Stay: Attending: Family Medicine

## 2023-12-15 ENCOUNTER — Inpatient Hospital Stay

## 2023-12-16 ENCOUNTER — Ambulatory Visit: Payer: Self-pay | Admitting: Family

## 2023-12-16 ENCOUNTER — Telehealth: Payer: Self-pay | Admitting: Family Medicine

## 2023-12-16 ENCOUNTER — Ambulatory Visit (INDEPENDENT_AMBULATORY_CARE_PROVIDER_SITE_OTHER): Admitting: Family

## 2023-12-16 VITALS — BP 131/82 | Temp 98.0°F | Resp 16 | Ht 66.0 in | Wt 258.2 lb

## 2023-12-16 DIAGNOSIS — I10 Essential (primary) hypertension: Secondary | ICD-10-CM | POA: Diagnosis not present

## 2023-12-16 DIAGNOSIS — R6889 Other general symptoms and signs: Secondary | ICD-10-CM | POA: Insufficient documentation

## 2023-12-16 DIAGNOSIS — M722 Plantar fascial fibromatosis: Secondary | ICD-10-CM | POA: Diagnosis not present

## 2023-12-16 LAB — TSH: TSH: 1.08 u[IU]/mL (ref 0.35–5.50)

## 2023-12-16 LAB — T4, FREE: Free T4: 0.72 ng/dL (ref 0.60–1.60)

## 2023-12-16 LAB — T3, FREE: T3, Free: 3.5 pg/mL (ref 2.3–4.2)

## 2023-12-16 MED ORDER — MELOXICAM 7.5 MG PO TABS: 7.5000 mg | ORAL_TABLET | Freq: Every day | ORAL | 0 refills | Status: DC

## 2023-12-16 NOTE — Assessment & Plan Note (Signed)
 Check TFT's, low normal TSH last check.  Lab Results  Component Value Date   TSH 1.08 12/16/2023

## 2023-12-16 NOTE — Patient Instructions (Signed)
 VISIT SUMMARY:  Today, we addressed your heel pain and discussed your postpartum hypertension and thyroid  function.  YOUR PLAN:  PLANTAR FASCIITIS: You have pain in your heels, especially the right one, likely due to inflammation of the plantar fascia. -Do exercises and stretches for plantar fasciitis. -Take anti-inflammatory medication for 1-2 weeks. -Ice your foot using a frozen water  bottle. -Wear supportive tennis shoes and avoid walking barefoot.  HYPERTENSION: Your blood pressure is elevated at 144/93, and you are currently taking losartan . -Recheck your blood pressure. -We may need to increase your losartan  if your blood pressure remains high. -Coordinate with Dr. Ubaldo for further management if needed.  THYROID  FUNCTION MONITORING: You have reported increased sweating, which may be related to your thyroid  function. -We will order a thyroid  function test to reassess your thyroid  levels.

## 2023-12-16 NOTE — Progress Notes (Signed)
 Subjective:     Patient ID: Melanie Stout, female    DOB: [birth year], 38 y.o.   MRN: [id number]  No chief complaint on file.   HPI  Discussed the use of AI scribe software for clinical note transcription with the patient, who gave verbal consent to proceed.  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  She experiences significant heel pain, with the right heel being worse than the left. The pain is severe with pressure, especially when getting up at night, causing her to 'practically crawl.' She walks on her toes due to the heel pain. The pain persists despite her efforts to manage it.  She has postpartum hypertension with a current blood pressure of 144/93 and is taking losartan . She reports increased sweating recently. Her thyroid  function was checked four months ago and was normal.  She is a mother of four, with the youngest being 8 old. She works during the day and manages additional tasks at night, indicating a busy lifestyle. She is not breastfeeding- instead uses formula.     Health Maintenance Due  Topic Date Due   Hepatitis B Vaccines (1 of 3 - 19+ 3-dose series) Never done   HPV VACCINES (1 - 3-dose SCDM series) Never done   COVID-19 Vaccine (3 - Pfizer risk series) 12/04/2019    Past Medical History:  Diagnosis Date   Abnormal Pap smear    Blood transfusion without reported diagnosis 01/05/2013   Headache(784.0)    History of postpartum hemorrhage, currently pregnant    Hx of varicella    Hyperlipidemia     Past Surgical History:  Procedure Laterality Date   CESAREAN SECTION N/A 03/26/2022   Procedure: CESAREAN SECTION;  Surgeon: Dannielle Bouchard, DO;  Location: [facility] LD ORS;  Service: Obstetrics;  Laterality: N/A;   CESAREAN SECTION MULTI-GESTATIONAL WITH TUBAL Bilateral 05/22/2023   Procedure: REPEAT CESAREAN SECTION X1 WITH BILATERAL TUBAL  LIGATION;  Surgeon: Curlene Agent, MD;  Location: [facility] ORS;  Service: Obstetrics;  Laterality: Bilateral;   NOSE SURGERY      Family History  Problem Relation Age of Onset   Hypertension Mother    Parkinson's disease Father    Diabetes Maternal Aunt        Outpatient Medications Prior to Visit  Medication Sig Dispense Refill   clobetasol ointment (TEMOVATE) 0.05 % APPLY  OINTMENT TOPICALLY TWICE DAILY FOR 14 DAYS for 10     famotidine (PEPCID) 20 MG tablet Take 20 mg by mouth daily as needed for heartburn or indigestion.     folic acid  (FOLVITE ) 1 MG tablet Take 1 tablet (1 mg total) by mouth daily. 30 tablet 11   losartan  (COZAAR ) 50 MG tablet Take 1 tablet (50 mg total) by mouth daily. 30 tablet 3   pantoprazole  (PROTONIX ) 40 MG tablet Take 1 tablet (40 mg total) by mouth daily. (Patient taking differently: Take 40 mg by mouth daily as needed (acid reflux).) 90 tablet 1   sertraline  (ZOLOFT ) 25 MG tablet Take 75 mg by mouth daily.     simethicone  (MYLICON) 80 MG chewable tablet Chew 1 tablet (80 mg total) by mouth 3 (three) times daily after meals. 30 tablet 0   valACYclovir  (VALTREX ) 500 MG tablet Take 500 mg by mouth daily.     ibuprofen  (ADVIL ) 600 MG tablet Take 1 tablet (600 mg total) by mouth every 6 (six) hours. 30 tablet 0  No facility-administered medications prior to visit.    No Known Allergies  ROS See HPI    Objective:    Physical Exam Constitutional:      General: She is not in acute distress.    Appearance: Normal appearance. She is well-developed.  HENT:     Head: Normocephalic and atraumatic.     Right Ear: External ear normal.     Left Ear: External ear normal.  Eyes:     General: No scleral icterus. Neck:     Thyroid : No thyromegaly.  Cardiovascular:     Rate and Rhythm: Normal rate and regular rhythm.     Heart sounds: Normal heart sounds. No murmur heard. Pulmonary:     Effort: Pulmonary effort is normal. No respiratory distress.      Breath sounds: Normal breath sounds. No wheezing.  Musculoskeletal:     Cervical back: Neck supple.     Comments: Bilateral heels without swelling or lesions  Skin:    General: Skin is warm and dry.  Neurological:     Mental Status: She is alert and oriented to person, place, and time.  Psychiatric:        Mood and Affect: Mood normal.        Behavior: Behavior normal.        Thought Content: Thought content normal.        Judgment: Judgment normal.      BP 131/82 (BP Location: Right Arm, Patient Position: Sitting, Cuff Size: Normal)   Temp 98 F (36.7 C) (Oral)   Resp 16   Ht 5' 6 (1.676 m)   Wt 258 lb 3.2 oz (117.1 kg)   SpO2 100%   BMI 41.67 kg/m  Wt Readings from Last 3 Encounters:  12/16/23 258 lb 3.2 oz (117.1 kg)  10/14/23 261 lb 12.8 oz (118.8 kg)  08/06/23 264 lb 12.8 oz (120.1 kg)       Assessment & Plan:   Problem List Items Addressed This Visit       Unprioritized   Plantar fasciitis   New. Recommended meloxicam , exercises, icing, good supportive shoes.  Wears crocs usually.       Relevant Medications   meloxicam  (MOBIC ) 7.5 MG tablet   Hypertension   Initial BP elevated.  Repeat BP OK. Continue losartan  and PCP follow  up.      Heat intolerance - Primary   Check TFT's, low normal TSH last check.  Lab Results  Component Value Date   TSH 1.08 12/16/2023         Relevant Orders   T3, free (Completed)   T4, free (Completed)   TSH (Completed)    I have discontinued Melanie Stout's ibuprofen . I am also having her start on meloxicam . Additionally, I am having her maintain her folic acid , pantoprazole , famotidine, sertraline , valACYclovir , simethicone , losartan , and clobetasol ointment.  Meds ordered this encounter  Medications   meloxicam  (MOBIC ) 7.5 MG tablet    Sig: Take 1 tablet (7.5 mg total) by mouth daily.    Dispense:  14 tablet    Refill:  0    Supervising Provider:   DOMENICA BLACKBIRD A [[id number]]

## 2023-12-16 NOTE — Telephone Encounter (Signed)
 Pt dropped off papers for pcp to fill out. Placed papers in pcps box. Please call when papers are faxed.

## 2023-12-16 NOTE — Assessment & Plan Note (Signed)
 Initial BP elevated.  Repeat BP OK. Continue losartan  and PCP follow  up.

## 2023-12-16 NOTE — Assessment & Plan Note (Signed)
 New. Recommended meloxicam , exercises, icing, good supportive shoes.  Wears crocs usually.

## 2023-12-17 ENCOUNTER — Encounter: Payer: Self-pay | Admitting: Family Medicine

## 2023-12-17 NOTE — Telephone Encounter (Signed)
Form has been placed in PCP folder for review.

## 2023-12-18 ENCOUNTER — Encounter: Payer: Self-pay | Admitting: Family Medicine

## 2023-12-25 NOTE — Telephone Encounter (Signed)
 Spoke to patient, she request a work from home due to anxiety which is worsened at her employment environment.  I completed paperwork and we will fax it for her today

## 2024-01-13 ENCOUNTER — Inpatient Hospital Stay: Attending: Family Medicine

## 2024-01-13 ENCOUNTER — Ambulatory Visit: Admitting: Medical Oncology

## 2024-01-13 DIAGNOSIS — D500 Iron deficiency anemia secondary to blood loss (chronic): Secondary | ICD-10-CM | POA: Insufficient documentation

## 2024-01-13 DIAGNOSIS — D563 Thalassemia minor: Secondary | ICD-10-CM | POA: Insufficient documentation

## 2024-01-13 DIAGNOSIS — N920 Excessive and frequent menstruation with regular cycle: Secondary | ICD-10-CM | POA: Insufficient documentation

## 2024-01-14 NOTE — Telephone Encounter (Signed)
 Forms have been updated by PCP and faxed again.

## 2024-01-19 ENCOUNTER — Telehealth: Payer: Self-pay

## 2024-01-19 NOTE — Telephone Encounter (Signed)
 Copied from CRM #[id number]. Topic: General - Other >> Jan 19, 2024 12:28 PM Mercedes MATSU wrote: Reason for CRM: Patient wanted to requesting to speak with Dr. Ubaldo, she said she sent her a mychart message and she did not respond yet, Patient requesting her to call back or write via mychart.

## 2024-01-19 NOTE — Telephone Encounter (Signed)
 Spoke to patient, we faxed paperwork again for her last week but she has been told it was not received.  Team, can we please email it directly to the patient instead at her Gmail address Melanie Stout I hope you still have this paperwork on hand?  We faxed it last week.  Let me know if it is already gone to scan

## 2024-01-27 ENCOUNTER — Ambulatory Visit: Payer: Self-pay

## 2024-01-27 ENCOUNTER — Emergency Department (HOSPITAL_BASED_OUTPATIENT_CLINIC_OR_DEPARTMENT_OTHER)

## 2024-01-27 ENCOUNTER — Other Ambulatory Visit: Payer: Self-pay

## 2024-01-27 ENCOUNTER — Encounter (HOSPITAL_BASED_OUTPATIENT_CLINIC_OR_DEPARTMENT_OTHER): Payer: Self-pay

## 2024-01-27 ENCOUNTER — Emergency Department (HOSPITAL_BASED_OUTPATIENT_CLINIC_OR_DEPARTMENT_OTHER)
Admission: EM | Admit: 2024-01-27 | Discharge: 2024-01-27 | Disposition: A | Discharge status: Home / Self Care | Attending: Emergency Medicine | Admitting: Emergency Medicine

## 2024-01-27 DIAGNOSIS — R42 Dizziness and giddiness: Secondary | ICD-10-CM | POA: Insufficient documentation

## 2024-01-27 DIAGNOSIS — R5383 Other fatigue: Secondary | ICD-10-CM | POA: Insufficient documentation

## 2024-01-27 LAB — COMPREHENSIVE METABOLIC PANEL WITH GFR
ALT: 10 U/L (ref 0–44)
AST: 17 U/L (ref 15–41)
Albumin: 4.5 g/dL (ref 3.5–5.0)
Alkaline Phosphatase: 77 U/L (ref 38–126)
Anion gap: 14 (ref 5–15)
BUN: 17 mg/dL (ref 6–20)
CO2: 20 mmol/L — ABNORMAL LOW (ref 22–32)
Calcium: 8.9 mg/dL (ref 8.9–10.3)
Chloride: 106 mmol/L (ref 98–111)
Creatinine, Ser: 0.92 mg/dL (ref 0.44–1.00)
GFR, Estimated: >60 mL/min (ref >60)
Glucose, Bld: 108 mg/dL — ABNORMAL HIGH (ref 70–99)
Potassium: 4.1 mmol/L (ref 3.5–5.1)
Sodium: 140 mmol/L (ref 135–145)
Total Bilirubin: 0.2 mg/dL (ref 0.0–1.2)
Total Protein: 7.2 g/dL (ref 6.5–8.1)

## 2024-01-27 LAB — CBC
HCT: 40 % (ref 36.0–46.0)
Hemoglobin: 12.9 g/dL (ref 12.0–15.0)
MCH: 26.5 pg (ref 26.0–34.0)
MCHC: 32.3 g/dL (ref 30.0–36.0)
MCV: 82.3 fL (ref 80.0–100.0)
Platelets: 362 K/uL (ref 150–400)
RBC: 4.86 MIL/uL (ref 3.87–5.11)
RDW: 13.4 % (ref 11.5–15.5)
WBC: 7.1 K/uL (ref 4.0–10.5)
nRBC: 0 % (ref 0.0–0.2)

## 2024-01-27 LAB — URINALYSIS, ROUTINE W REFLEX MICROSCOPIC
Bilirubin Urine: NEGATIVE
Glucose, UA: NEGATIVE mg/dL
Hgb urine dipstick: NEGATIVE
Ketones, ur: NEGATIVE mg/dL
Leukocytes,Ua: NEGATIVE
Nitrite: NEGATIVE
Protein, ur: NEGATIVE mg/dL
Specific Gravity, Urine: >=1.03 (ref 1.005–1.030)
pH: 6 (ref 5.0–8.0)

## 2024-01-27 LAB — RESP PANEL BY RT-PCR (RSV, FLU A&B, COVID)  RVPGX2
Influenza A by PCR: NEGATIVE
Influenza B by PCR: NEGATIVE
Resp Syncytial Virus by PCR: NEGATIVE
SARS Coronavirus 2 by RT PCR: NEGATIVE

## 2024-01-27 LAB — D-DIMER, QUANTITATIVE: D-Dimer, Quant: <0.27 ug{FEU}/mL (ref 0.00–0.50)

## 2024-01-27 LAB — PREGNANCY, URINE: Preg Test, Ur: NEGATIVE

## 2024-01-27 LAB — TROPONIN T, HIGH SENSITIVITY: Troponin T High Sensitivity: <15 ng/L (ref 0–19)

## 2024-01-27 LAB — CBG MONITORING, ED: Glucose-Capillary: 106 mg/dL — ABNORMAL HIGH (ref 70–99)

## 2024-01-27 NOTE — Telephone Encounter (Signed)
 Pt in ED.

## 2024-01-27 NOTE — ED Triage Notes (Signed)
 C/o dizziness, seeing spots, heart palpitations, bilateral arm weakness but left greater than right per patient x 2 days.

## 2024-01-27 NOTE — ED Notes (Signed)
 Patient returned from radiology

## 2024-01-27 NOTE — ED Notes (Signed)
 RN provided AVS using Teachback Method. Patient verbalizes understanding of Discharge Instructions. Opportunity for Questioning and Answers were provided by RN. Patient Discharged from ED ambulatory to home via self.

## 2024-01-27 NOTE — ED Provider Notes (Signed)
 [place] [facility] Provider Note   CSN: [id number] Arrival date & time: 01/27/24  9150     Patient presents with: Dizziness   Melanie Stout is a 38 y.o. female presenting to the ED with complaint of dizziness, lightheadedness, left arm numbness.  Patient reports onset of symptoms about 2 days ago.  She says specifically she is feeling lightheaded, not vertiginous.  It is not worse with exertion or standing.  She has had intermittent sensation of palpitations like her heart is pounding.  She says she feels very low energy, weak.  She says she has this feeling of numbness in her left arm.  She reports less of an appetite but no vomiting, no diarrhea, no fevers, chills, sore throat.  Her medical history is notable for iron  deficiency anemia for which she gets infusions.  She reports she does have heavy menstrual cycles but is not currently having 1  She reports her medical history is notable only for reflux and high blood pressure, which began after her pregnancy 8 months ago.  She takes Cozaar  for this.  She denies any personal or family history of DVT or PE, autoimmune disease, lupus, multiple sclerosis.  She denies any unilateral leg pain or swelling reports she has had pain at the bottom of her heels for several weeks or months.   HPI     Prior to Admission medications   Medication Sig Start Date End Date Taking? Authorizing Provider  clobetasol ointment (TEMOVATE) 0.05 % APPLY  OINTMENT TOPICALLY TWICE DAILY FOR 14 DAYS for 10    [provider]  famotidine (PEPCID) 20 MG tablet Take 20 mg by mouth daily as needed for heartburn or indigestion.    [provider]  folic acid  (FOLVITE ) 1 MG tablet Take 1 tablet (1 mg total) by mouth daily. 03/13/23   Tonette Lauraine HERO, PA-C  losartan  (COZAAR ) 50 MG tablet Take 1 tablet (50 mg total) by mouth daily. 08/06/23   Copland, Harlene BROCKS, MD  meloxicam  (MOBIC ) 7.5 MG tablet Take 1  tablet (7.5 mg total) by mouth daily. 12/16/23   O'Sullivan, Melissa, NP  pantoprazole  (PROTONIX ) 40 MG tablet Take 1 tablet (40 mg total) by mouth daily. Patient taking differently: Take 40 mg by mouth daily as needed (acid reflux). 04/03/23   Copland, Harlene BROCKS, MD  sertraline  (ZOLOFT ) 25 MG tablet Take 75 mg by mouth daily.    [provider]  simethicone  (MYLICON) 80 MG chewable tablet Chew 1 tablet (80 mg total) by mouth 3 (three) times daily after meals. 05/25/23   Laurence Slater PARAS, MD  valACYclovir  (VALTREX ) 500 MG tablet Take 500 mg by mouth daily. 05/06/23   [provider]    Allergies: Patient has no known allergies.    Review of Systems  Updated Vital Signs BP (!) 169/99 (BP Location: Right Arm)   Pulse 75   Temp 97.7 F (36.5 C) (Oral)   Resp 18   Ht 5' 6 (1.676 m)   Wt 117.9 kg   LMP 01/10/2024   SpO2 100%   BMI 41.97 kg/m   Physical Exam Constitutional:      General: She is not in acute distress.    Appearance: She is obese.  HENT:     Head: Normocephalic and atraumatic.  Eyes:     Conjunctiva/sclera: Conjunctivae normal.     Pupils: Pupils are equal, round, and reactive to light.  Cardiovascular:     Rate and Rhythm:  Normal rate and regular rhythm.  Pulmonary:     Effort: Pulmonary effort is normal. No respiratory distress.  Abdominal:     General: There is no distension.     Tenderness: There is no abdominal tenderness.  Skin:    General: Skin is warm and dry.  Neurological:     General: No focal deficit present.     Mental Status: She is alert. Mental status is at baseline.  Psychiatric:        Mood and Affect: Mood normal.        Behavior: Behavior normal.     (all labs ordered are listed, but only abnormal results are displayed) Labs Reviewed  COMPREHENSIVE METABOLIC PANEL WITH GFR - Abnormal; Notable for the following components:      Result Value   CO2 20 (*)    Glucose, Bld 108 (*)    All other components within normal  limits  CBG MONITORING, ED - Abnormal; Notable for the following components:   Glucose-Capillary 106 (*)    All other components within normal limits  RESP PANEL BY RT-PCR (RSV, FLU A&B, COVID)  RVPGX2  CBC  URINALYSIS, ROUTINE W REFLEX MICROSCOPIC  PREGNANCY, URINE  D-DIMER, QUANTITATIVE  TROPONIN T, HIGH SENSITIVITY    EKG: EKG Interpretation Date/Time:  Tuesday January 27 2024 08:57:07 EDT Ventricular Rate:  66 PR Interval:  130 QRS Duration:  96 QT Interval:  416 QTC Calculation: 436 R Axis:   43  Text Interpretation: Sinus rhythm Confirmed by Cottie Cough [phone]) on 01/27/2024 9:05:38 AM  Radiology: [facility] Chest 2 View Result Date: 01/27/2024 EXAM: PA and Lateral (2 views) XRAY OF THE CHEST 01/27/2024 09:40:58 AM COMPARISON: 04/01/2019 CLINICAL HISTORY: Shortness of breath. SOB x2 days. No pertinent heart/lung history. FINDINGS: LUNGS AND PLEURA: No focal pulmonary opacity. No pulmonary edema. No pleural effusion. No pneumothorax. HEART AND MEDIASTINUM: No acute abnormality of the cardiac and mediastinal silhouettes. BONES AND SOFT TISSUES: No acute osseous abnormality. IMPRESSION: 1. No acute process. Electronically signed by: Evalene Coho MD 01/27/2024 10:05 AM EDT RP Workstation: [id number]   CT Head Wo Contrast Result Date: 01/27/2024 EXAM: CT HEAD WITHOUT CONTRAST 01/27/2024 09:51:00 AM TECHNIQUE: CT of the head was performed without the administration of intravenous contrast. Automated exposure control, iterative reconstruction, and/or weight based adjustment of the mA/kV was utilized to reduce the radiation dose to as low as reasonably achievable. COMPARISON: 04/27/2018 CLINICAL HISTORY: Lightheadedness, left arm heaviness. 2 days of dizziness, light headed, left arm weakness, seeing spots. FINDINGS: BRAIN AND VENTRICLES: No acute hemorrhage. Gray-white differentiation is preserved. No hydrocephalus. No extra-axial collection. No mass effect or midline shift. ORBITS: No  acute abnormality. SINUSES: No acute abnormality. SOFT TISSUES AND SKULL: No acute soft tissue abnormality. No skull fracture. IMPRESSION: 1. No acute intracranial abnormality. Electronically signed by: Evalene Coho MD 01/27/2024 10:04 AM EDT RP Workstation: [id number]     Procedures   Medications Ordered in the ED - No data to display                                  Medical Decision Making Amount and/or Complexity of Data Reviewed Labs: ordered. Radiology: ordered.   This patient presents to the ED with concern for lightheadedness, left arm numbness, subjective shortness of breath. This involves an extensive number of treatment options, and is a complaint that carries with it a high risk of complications and morbidity.  The differential  diagnosis includes viral syndrome versus anemia versus atypical ACS versus electrolyte derangement versus PE versus other  Co-morbidities that complicate the patient evaluation: High blood pressure is a risk factor for cardiovascular disease  External records from outside source obtained and reviewed including history of infusion for iron  deficiency anemia.  Last hemoglobin 3 months ago was 12.3  I ordered and personally interpreted labs.  The pertinent results include: No emergent findings.  D-dimer undetectable.  Troponin undetectable.  I ordered imaging studies including x-ray of the chest I independently visualized and interpreted imaging which showed no emergent findings I agree with the radiologist interpretation  The patient was maintained on a cardiac monitor.  I personally viewed and interpreted the cardiac monitored which showed an underlying rhythm of: Sinus rhythm  Per my interpretation the patient's ECG shows sinus rhythm no acute ischemic findings  I have reviewed the patients home medicines and have made adjustments as needed  Test Considered: With an undetectable D-dimer and no other risk factors for PE, I do not feel that a CT  angiogram is indicated at this time, I have a low clinical suspicion for pulmonary embolism, aortic dissection, or other life-threatening intrathoracic emergency  She has been diagnosed with plantar fasciitis for her bilateral foot and heel pain, which I do think is consistent with her description of heel pain that is worse when getting up in the night after lying for a while.  This is unfortunately a difficult to manage issue.  We discussed some physical therapy and tennis ball rolling techniques.  There is no clear etiology for the patient's symptoms at this time.  No evidence of ACS, PE.  No prominent murmur to suggest aortic valvular disease or mitral valvular disease as a cause of lightheadedness.  Nor is her lightheadedness exertional; doubt carotid stenosis or stroke.  She has no risk factors or family history of multiple sclerosis or demyelinating disease to warrant emergent MRI at this time.  It is possible she is experiencing a viral syndrome.  I am recommending that she stay well-hydrated at home, give this a few days, and follow-up with her PCP in the office.  Hospitalization was considered, but at this time I feel she would be stable for outpatient follow-up.  Disposition:  After consideration of the diagnostic results and the patients response to treatment, I feel that the patent would benefit from close outpatient follow-up.      Final diagnoses:  Lightheadedness  Other fatigue    ED Discharge Orders     None          Cottie Donnice PARAS, MD 01/27/24 1050

## 2024-01-27 NOTE — ED Notes (Signed)
 EDP at the bedside.  ?

## 2024-01-27 NOTE — Telephone Encounter (Signed)
 FYI Only or Action Required?: FYI only for provider.  Patient was last seen in primary care on 12/16/2023 by Daryl Setter, NP.  Called Nurse Triage reporting No chief complaint on file..  Symptoms began several days ago.  Interventions attempted: Rest, hydration, or home remedies.  Symptoms are: gradually worsening.  Triage Disposition: Go to ED Now (Notify PCP)  Patient/caregiver understands and will follow disposition?: Yes  Copied from CRM [id number]. Topic: Clinical - Red Word Triage >> Jan 27, 2024  8:16 AM Thersia BROCKS wrote: [facility] that prompted transfer to Nurse Triage: Patient called in stating dizzy, heart is beating real fast , breaking out in sweat. Patient thinks it may be her blood pressure    ----------------------------------------------------------------------- From previous Reason for Contact - Scheduling: Patient/patient representative is calling to schedule an appointment. Refer to attachments for appointment information. Reason for Disposition  [1] Weakness (i.e., paralysis, loss of muscle strength) of the face, arm / hand, or leg / foot on one side of the body AND [2] sudden onset AND [3] brief (now gone)  Difficulty breathing  Answer Assessment - Initial Assessment Questions 1. DESCRIPTION: Describe your dizziness.     Lightheadedness  2. LIGHTHEADED: Do you feel lightheaded? (e.g., somewhat faint, woozy, weak upon standing)     Lightheadedness  3. VERTIGO: Do you feel like either you or the room is spinning or tilting? (i.e., vertigo)     Denies  4. SEVERITY: How bad is it?  Do you feel like you are going to faint? Can you stand and walk?     Able to walk  5. ONSET:  When did the dizziness begin?       Two days ago   6. AGGRAVATING FACTORS: Does anything make it worse? (e.g., standing, change in head position)     Denies any aggrvating factors  7. HEART RATE: Can you tell me your heart rate? How many beats in 15 seconds?   (Note: Not all patients can do this.)       Fast Heart Rate  8. CAUSE: What do you think is causing the dizziness? (e.g., decreased fluids or food, diarrhea, emotional distress, heat exposure, new medicine, sudden standing, vomiting; unknown)     Unsure  9. RECURRENT SYMPTOM: Have you had dizziness before? If Yes, ask: When was the last time? What happened that time?     Yes,   10. OTHER SYMPTOMS: Do you have any other symptoms? (e.g., fever, chest pain, vomiting, diarrhea, bleeding)       Breathing Difficulty, Floaters and Spots, Left Arm Weakness  11. PREGNANCY: Is there any chance you are pregnant? When was your last menstrual period?       No and  Protocols used: Dizziness - Lightheadedness-A-AH

## 2024-01-28 ENCOUNTER — Other Ambulatory Visit (HOSPITAL_COMMUNITY): Payer: Self-pay

## 2024-01-28 ENCOUNTER — Encounter: Payer: Self-pay | Admitting: Family Medicine

## 2024-01-29 ENCOUNTER — Other Ambulatory Visit: Payer: Self-pay | Admitting: Family Medicine

## 2024-01-29 ENCOUNTER — Encounter: Payer: Self-pay | Admitting: Medical Oncology

## 2024-01-29 ENCOUNTER — Inpatient Hospital Stay

## 2024-01-29 ENCOUNTER — Inpatient Hospital Stay (HOSPITAL_BASED_OUTPATIENT_CLINIC_OR_DEPARTMENT_OTHER): Admitting: Medical Oncology

## 2024-01-29 VITALS — BP 122/67 | HR 74 | Temp 98.6°F | Resp 19 | Ht 66.0 in | Wt 264.0 lb

## 2024-01-29 DIAGNOSIS — N920 Excessive and frequent menstruation with regular cycle: Secondary | ICD-10-CM | POA: Diagnosis present

## 2024-01-29 DIAGNOSIS — D563 Thalassemia minor: Secondary | ICD-10-CM

## 2024-01-29 DIAGNOSIS — R7989 Other specified abnormal findings of blood chemistry: Secondary | ICD-10-CM | POA: Diagnosis not present

## 2024-01-29 DIAGNOSIS — D500 Iron deficiency anemia secondary to blood loss (chronic): Secondary | ICD-10-CM | POA: Diagnosis present

## 2024-01-29 DIAGNOSIS — R030 Elevated blood-pressure reading, without diagnosis of hypertension: Secondary | ICD-10-CM

## 2024-01-29 LAB — CBC
HCT: 37.7 % (ref 36.0–46.0)
Hemoglobin: 12.3 g/dL (ref 12.0–15.0)
MCH: 26.9 pg (ref 26.0–34.0)
MCHC: 32.6 g/dL (ref 30.0–36.0)
MCV: 82.5 fL (ref 80.0–100.0)
Platelets: 316 K/uL (ref 150–400)
RBC: 4.57 MIL/uL (ref 3.87–5.11)
RDW: 13.3 % (ref 11.5–15.5)
WBC: 5.9 K/uL (ref 4.0–10.5)
nRBC: 0 % (ref 0.0–0.2)

## 2024-01-29 LAB — RETIC PANEL
Immature Retic Fract: 11 % (ref 2.3–15.9)
RBC.: 4.56 MIL/uL (ref 3.87–5.11)
Retic Count, Absolute: 67.5 K/uL (ref 19.0–186.0)
Retic Ct Pct: 1.5 % (ref 0.4–3.1)
Reticulocyte Hemoglobin: 31 pg (ref >27.9)

## 2024-01-29 LAB — FERRITIN: Ferritin: 7 ng/mL — ABNORMAL LOW (ref 11–307)

## 2024-01-29 LAB — VITAMIN B12: Vitamin B-12: 300 pg/mL (ref 180–914)

## 2024-01-29 LAB — IRON AND IRON BINDING CAPACITY (CC-WL,HP ONLY)
Iron: 33 ug/dL (ref 28–170)
Saturation Ratios: 8 % — ABNORMAL LOW (ref 10.4–31.8)
TIBC: 393 ug/dL (ref 250–450)
UIBC: 360 ug/dL

## 2024-01-29 NOTE — Progress Notes (Signed)
 Hematology and Oncology Follow Up Visit  Melanie Stout [id number] [birth year] 38 y.o. 01/29/2024   Principle Diagnosis:  Iron  deficiency anemia  Alpha thalassemia minor trait  Current Therapy:   IV iron  as indicated - oral iron  on hold due to ineffectiveness  Folic acid  1 mg daily PO- taking this some days  Interim History:  Melanie Stout is here today for follow-up.   Today she states that she has been ok. She has struggled with fatigue, dizziness, and some neuro changes. She has been seen by her PCP and the ER for this without known caused despite brain CT, labs.  Cycles have been very heavy. Occurs about once per month. Lasting about 5-6 days She is not breastfeeding.  No blood loss noted. No bruising or petechiae.  No fever, chills, n/v, cough, rash, chest pain, abdominal pain or changes in bladder habits.  No swelling, numbness or tingling in her extremities at this time.  No falls or syncope.  ETOH use is none Appetite and hydration are good.  Wt Readings from Last 3 Encounters:  01/29/24 264 lb (119.7 kg)  01/27/24 260 lb (117.9 kg)  12/16/23 258 lb 3.2 oz (117.1 kg)   ECOG Performance Status: 1 - Symptomatic but completely ambulatory  Medications:  Allergies as of 01/29/2024   No Known Allergies      Medication List        Accurate as of January 29, 2024  3:39 PM. If you have any questions, ask your nurse or doctor.          STOP taking these medications    clobetasol ointment 0.05 % Commonly known as: TEMOVATE Stopped by: Lauraine CHRISTELLA Dais   famotidine 20 MG tablet Commonly known as: PEPCID Stopped by: Lauraine CHRISTELLA Dais   meloxicam  7.5 MG tablet Commonly known as: MOBIC  Stopped by: Lauraine CHRISTELLA Dais   pantoprazole  40 MG tablet Commonly known as: PROTONIX  Stopped by: Lauraine CHRISTELLA Dais   simethicone  80 MG chewable tablet Commonly known as: MYLICON Stopped by: Lauraine CHRISTELLA Dais   valACYclovir  500 MG tablet Commonly known as:  VALTREX  Stopped by: Lauraine CHRISTELLA Dais       TAKE these medications    folic acid  1 MG tablet Commonly known as: FOLVITE  Take 1 tablet (1 mg total) by mouth daily.   losartan  50 MG tablet Commonly known as: COZAAR  Take 1 tablet by mouth once daily   sertraline  25 MG tablet Commonly known as: ZOLOFT  Take 75 mg by mouth daily.        Allergies: No Known Allergies  Past Medical History, Surgical history, Social history, and Family History were reviewed and updated.  Review of Systems: All other 10 point review of systems is negative.   Physical Exam:  height is 5' 6 (1.676 m) and weight is 264 lb (119.7 kg). Her oral temperature is 98.6 F (37 C). Her blood pressure is 122/67 and her pulse is 74. Her respiration is 19 and oxygen saturation is 99%.   Wt Readings from Last 3 Encounters:  01/29/24 264 lb (119.7 kg)  01/27/24 260 lb (117.9 kg)  12/16/23 258 lb 3.2 oz (117.1 kg)    Ocular: Sclerae unicteric, pupils equal, round and reactive to light Ear-nose-throat: Oropharynx clear, dentition fair Lymphatic: No cervical or supraclavicular adenopathy Lungs no rales or rhonchi, good excursion bilaterally Heart regular rate and rhythm, no murmur appreciated Abd soft, nontender, positive bowel sounds MSK no focal spinal tenderness, no joint edema Neuro: non-focal, well-oriented, appropriate affect  Lab Results  Component Value Date   WBC 5.9 01/29/2024   HGB 12.3 01/29/2024   HCT 37.7 01/29/2024   MCV 82.5 01/29/2024   PLT 316 01/29/2024   Lab Results  Component Value Date   FERRITIN 35 10/14/2023   IRON  50 10/14/2023   TIBC 375 10/14/2023   UIBC 325 10/14/2023   IRONPCTSAT 13 10/14/2023   Lab Results  Component Value Date   RETICCTPCT 1.5 01/29/2024   RBC 4.57 01/29/2024   RBC 4.56 01/29/2024   No results found for: KPAFRELGTCHN, LAMBDASER, KAPLAMBRATIO No results found for: IGGSERUM, IGA, IGMSERUM No results found for: STEPHANY CARLOTA BENSON MARKEL EARLA JOANNIE DOC VICK, SPEI   Chemistry      Component Value Date/Time   NA 140 01/27/2024 0859   K 4.1 01/27/2024 0859   CL 106 01/27/2024 0859   CO2 20 (L) 01/27/2024 0859   BUN 17 01/27/2024 0859   CREATININE 0.92 01/27/2024 0859   CREATININE 0.77 08/02/2022 1456   CREATININE 0.70 12/13/2015 1104      Component Value Date/Time   CALCIUM 8.9 01/27/2024 0859   ALKPHOS 77 01/27/2024 0859   AST 17 01/27/2024 0859   AST 14 (L) 08/02/2022 1456   ALT 10 01/27/2024 0859   ALT 12 08/02/2022 1456   BILITOT 0.2 01/27/2024 0859   BILITOT 0.2 (L) 08/02/2022 1456      Encounter Diagnoses  Name Primary?   Iron  deficiency anemia due to chronic blood loss Yes   Low vitamin B12 level    Impression and Plan: Melanie Stout is a very pleasant 38 yo [place] American female with history of iron  deficiency anemia secondary to heavy cycle as well as the alpha thalassemia minor trait. She is not currently taking any B12 supplements.   Today her Hgb is 12.3 which is stable Iron  studies are pending. We will replace if needed.  B12 pending  RTC 3 months APP, labs (CBC, retic, iron , ferritin, B12)  Lauraine CHRISTELLA Dais, PA-C 8/28/20253:39 PM

## 2024-02-05 ENCOUNTER — Encounter: Payer: Self-pay | Admitting: Family Medicine

## 2024-02-05 DIAGNOSIS — R030 Elevated blood-pressure reading, without diagnosis of hypertension: Secondary | ICD-10-CM

## 2024-02-10 ENCOUNTER — Ambulatory Visit: Payer: Self-pay | Admitting: Medical Oncology

## 2024-02-10 NOTE — Telephone Encounter (Signed)
 I do not see the form in your basket, however I have reprinted and placed in your folder for review.

## 2024-02-16 ENCOUNTER — Other Ambulatory Visit (INDEPENDENT_AMBULATORY_CARE_PROVIDER_SITE_OTHER): Payer: Self-pay

## 2024-02-16 ENCOUNTER — Encounter: Payer: Self-pay | Admitting: Sports Medicine

## 2024-02-16 ENCOUNTER — Ambulatory Visit (INDEPENDENT_AMBULATORY_CARE_PROVIDER_SITE_OTHER): Admitting: Sports Medicine

## 2024-02-16 DIAGNOSIS — M79671 Pain in right foot: Secondary | ICD-10-CM

## 2024-02-16 DIAGNOSIS — M2142 Flat foot [pes planus] (acquired), left foot: Secondary | ICD-10-CM | POA: Diagnosis not present

## 2024-02-16 DIAGNOSIS — M722 Plantar fascial fibromatosis: Secondary | ICD-10-CM | POA: Diagnosis not present

## 2024-02-16 DIAGNOSIS — M2141 Flat foot [pes planus] (acquired), right foot: Secondary | ICD-10-CM

## 2024-02-16 DIAGNOSIS — M79672 Pain in left foot: Secondary | ICD-10-CM

## 2024-02-16 NOTE — Progress Notes (Signed)
 Patient says that she has had heel pain for 4-5 months, with the right being worse than the left. She says that she was picking up extra shifts around the time that it happened; she works as a Programmer, systems. She says that at that time, she was wearing crocs, and she has since been wearing flip flops. Her pain is much worse if she has been sitting or laying for an extended period of time, and she says that she cannot get out of bed without flip flops due to the pain. She will occasionally have tingling and numbness on the heels. She was prescribed Meloxicam , which gave her no relief at all. She has also tried BC powder and Excedrin, which have not helped her pain. She has tried warm baths, which give her temporary relief only.  Patient was instructed in 10 minutes of therapeutic exercises for bilateral heels to improve strength, ROM and function according to my instructions and plan of care by a Certified Athletic Trainer during the office visit. A customized handout was provided and demonstration of proper technique shown and discussed. Patient did perform exercises and demonstrate understanding through teachback.  All questions discussed and answered.

## 2024-02-16 NOTE — Progress Notes (Signed)
 Melanie Stout - 38 y.o. female MRN [id number]  Date of birth: 29-Jul-1985  Office Visit Note: Visit Date: 02/16/2024 PCP: Watt Harlene BROCKS, MD Referred by: Watt Harlene BROCKS, MD  Subjective: Chief Complaint  Patient presents with   Right Heel - Pain   Left Heel - Pain   HPI: Melanie Stout is a pleasant 38 y.o. female who presents today for chronic bilateral heel pain x 5 months.  She had insidious onset of medial sided plantar heel pain, the right is worse than the left.  No specific injury.  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  She has had to pull back from this because of her pain.  She has significant pain when she first wakes up in the morning with first steps as well as pain in the heels and feet after sitting or laying for an extended period of time and then going to get up.  Does wear crocs and flip-flops often.  She has been prescribed meloxicam  15 mg which has not been helpful.  She also has tried BC powder and Excedrin without significant relief.  She is quite frustrated and is looking for resolution of her pain.  Pertinent ROS were reviewed with the patient and found to be negative unless otherwise specified above in HPI.   Assessment & Plan: Visit Diagnoses:  1. Plantar fasciitis, bilateral   2. Heel pain, bilateral   3. Pes planus of both feet    Plan: Impression is chronic and progressive bilateral heel pain which does have evidence of plantar fasciitis in the setting of mild pes planus.  We discussed the overall pathophysiology of plantar fasciitis as well as conservative treatment including PT/rehab, stretching, orthotics/insoles, shockwave therapy and even injection.  Given her Achilles contracture, we will get her started in therapy, she prefers home therapy versus formal PT.  We did print out a customized handout and  my athletic trainer, Jinnie did review these with her in the room today.  She we will perform these once daily.  She is interested in injection therapy, we will get her set up for an appointment for ultrasound-guided plantar fascia injections only in the near future.  I did discuss the importance of getting her into over-the-counter orthotics to help support her longitudinal arch.  Given the fact that meloxicam  15 mg daily was not helpful for her, we will have her discontinue this altogether.  She is okay for over-the-counter anti-inflammatories in the interim as needed prior to her injections.  Follow-up: Return in about 1 week (around 02/23/2024) for at 1:45 pm US  b/l PF-inj.   Meds & Orders: No orders of the defined types were placed in this encounter.   Orders Placed This Encounter  Procedures   XR Foot Complete Right   XR Foot Complete Left     Procedures: No procedures performed      Clinical History: No specialty comments available.  She reports that she quit smoking about 7 years ago. Her smoking use included cigarettes. She has never used smokeless tobacco.  Recent Labs    08/06/23 1410  HGBA1C 5.0    Objective:   Vital Signs: LMP 01/10/2024   Physical Exam  Gen: Well-appearing, in no acute distress; non-toxic CV: Well-perfused. Warm.  Resp: Breathing unlabored on room air; no wheezing. Psych: Fluid speech  in conversation; appropriate affect; normal thought process  Ortho Exam - Bilateral feet/heels: No redness swelling or effusion of the heel or ankle joint.  There is positive tenderness to palpation over the medial aspect of the plantar fascia band near the insertion on the calcaneus bilaterally.  There is a degree of Achilles tendon contracture with the left dorsiflexion to 90 degrees, right 95 degrees.  Negative calcaneal heel squeeze.  There is a mild degree of pes planus with standing although still preservation of the transverse arch.  Imaging: XR Foot Complete  Left Result Date: 02/16/2024 Three-view x-ray of the right and left foot was ordered and reviewed by myself today including AP, lateral and oblique film.  The right foot has a very tiny plantar calcaneal spur as well as a mild degree of midfoot arthritic change.  No acute fracture noted.  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  No acute bony abnormality or fracture noted.  XR Foot Complete Right Result Date: 02/16/2024 Three-view x-ray of the right and left foot was ordered and reviewed by myself today including AP, lateral and oblique film.  The right foot has a very tiny plantar calcaneal spur as well as a mild degree of midfoot arthritic change.  No acute fracture noted.  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  No acute bony abnormality or fracture noted.   Past Medical/Family/Surgical/Social History: Medications & Allergies reviewed per EMR, new medications updated. Patient Active Problem List   Diagnosis Date Noted   Plantar fasciitis 12/16/2023   Heat intolerance 12/16/2023   Hypertension 12/16/2023   Term pregnancy 05/22/2023   Hidradenitis suppurativa 10/04/2022   IDA (iron  deficiency anemia) 08/02/2022   S/P cesarean section 03/26/2022   Obesity, unspecified obesity severity, unspecified obesity type 08/01/2016   Tobacco use disorder 06/05/2016   Past Medical History:  Diagnosis Date   Abnormal Pap smear    Blood transfusion without reported diagnosis 01/05/2013   Headache(784.0)    History of postpartum hemorrhage, currently pregnant    Hx of varicella    Hyperlipidemia    Family History  Problem Relation Age of Onset   Hypertension Mother    Parkinson's disease Father    Diabetes Maternal Aunt    Past Surgical History:  Procedure Laterality Date   CESAREAN SECTION N/A 03/26/2022   Procedure: CESAREAN SECTION;  Surgeon: Dannielle Bouchard, DO;   Location: [facility];  Service: Obstetrics;  Laterality: N/A;   CESAREAN SECTION MULTI-GESTATIONAL WITH TUBAL Bilateral 05/22/2023   Procedure: REPEAT CESAREAN SECTION X1 WITH BILATERAL TUBAL LIGATION;  Surgeon: Curlene Agent, MD;  Location: [facility] ORS;  Service: Obstetrics;  Laterality: Bilateral;   NOSE SURGERY     Social History   Occupational History   Not on file  Tobacco Use   Smoking status: Former    Current packs/day: 0.00    Types: Cigarettes    Quit date: 07/24/2016    Years since quitting: 7.5   Smokeless tobacco: Never  Vaping Use   Vaping status: Never Used  Substance and Sexual Activity   Alcohol use: Not Currently    Comment: occ   Drug use: No   Sexual activity: Yes    Birth control/protection: None

## 2024-02-17 MED ORDER — LOSARTAN POTASSIUM 50 MG PO TABS
50.0000 mg | ORAL_TABLET | Freq: Every day | ORAL | 3 refills | Status: AC
Start: 2024-02-17 — End: ?

## 2024-02-20 ENCOUNTER — Ambulatory Visit: Attending: Family Medicine

## 2024-02-20 VITALS — BP 100/55 | HR 74 | Temp 98.3°F | Resp 18

## 2024-02-20 DIAGNOSIS — N920 Excessive and frequent menstruation with regular cycle: Secondary | ICD-10-CM | POA: Diagnosis present

## 2024-02-20 DIAGNOSIS — Z79899 Other long term (current) drug therapy: Secondary | ICD-10-CM | POA: Diagnosis not present

## 2024-02-20 DIAGNOSIS — D500 Iron deficiency anemia secondary to blood loss (chronic): Secondary | ICD-10-CM | POA: Insufficient documentation

## 2024-02-20 MED ORDER — IRON SUCROSE 300 MG IVPB - SIMPLE MED
300.0000 mg | Freq: Once | Status: AC
  Administered 2024-02-20: 300 mg via INTRAVENOUS
  Filled 2024-02-20: qty 300

## 2024-02-20 MED ORDER — SODIUM CHLORIDE 0.9 % IV SOLN: Freq: Once | INTRAVENOUS | Status: AC

## 2024-02-20 NOTE — Patient Instructions (Signed)

## 2024-02-23 ENCOUNTER — Other Ambulatory Visit: Payer: Self-pay

## 2024-02-23 ENCOUNTER — Ambulatory Visit (INDEPENDENT_AMBULATORY_CARE_PROVIDER_SITE_OTHER): Admitting: Sports Medicine

## 2024-02-23 DIAGNOSIS — M722 Plantar fascial fibromatosis: Secondary | ICD-10-CM

## 2024-02-23 DIAGNOSIS — M79671 Pain in right foot: Secondary | ICD-10-CM

## 2024-02-23 DIAGNOSIS — M79672 Pain in left foot: Secondary | ICD-10-CM

## 2024-02-23 MED ORDER — BUPIVACAINE HCL 0.25 % IJ SOLN
2.0000 mL | INTRAMUSCULAR | Status: AC | PRN
  Administered 2024-02-23: 2 mL

## 2024-02-23 MED ORDER — METHYLPREDNISOLONE ACETATE 40 MG/ML IJ SUSP
20.0000 mg | INTRAMUSCULAR | Status: AC | PRN
  Administered 2024-02-23: 20 mg

## 2024-02-23 NOTE — Progress Notes (Signed)
   Procedure Note  Patient: Melanie Stout             Date of Birth: [birth year]           MRN: [id number]             Visit Date: 02/23/2024  Procedures: Visit Diagnoses:  1. Plantar fasciitis, bilateral   2. Heel pain, bilateral    Foot Inj: left plantar fascia  Date/Time: 02/23/2024 2:18 PM  Performed by: Burnetta Brunet, DO Authorized by: Burnetta Brunet, DO   Consent Given by:  Patient Site marked: the procedure site was marked   Timeout: prior to procedure the correct patient, procedure, and site was verified   Indications:  Fasciitis and pain Condition: Plantar Fasciitis   Location: left plantar fascia muscle   Prep: patient was prepped and draped in usual sterile fashion   Needle Size:  22 G Medications:  2 mL bupivacaine  0.25 %; 20 mg methylPREDNISolone  acetate 40 MG/ML Patient Tolerance:  Patient tolerated the procedure well with no immediate complications  Procedure: Plantar fasciitis injection, Left After discussion on R/B/I and informed verbal consent was obtained, a timeout was performed. Patient was lying prone on exam table. Medial aspect of inferior calcaneus was identified with point of maximum tenderness and area was cleaned with betadine  and alcohol swab. Then utilizing ultrasound guidance via an in-plane approach, the patient's plantar fasica of left foot was injected with 2:1 bupivicaine:depomedrol with multiple needle fenestrations from a medial to lateral in-plane approach. Patient tolerated procedure well without immediate complications.  Foot Inj: right plantar fascia  Date/Time: 02/23/2024 2:19 PM  Performed by: Burnetta Brunet, DO Authorized by: Burnetta Brunet, DO   Consent Given by:  Patient Site marked: the procedure site was marked   Timeout: prior to procedure the correct patient, procedure, and site was verified   Indications:  Fasciitis and pain Condition: Plantar Fasciitis   Location: right plantar fascia muscle   Prep: patient was prepped  and draped in usual sterile fashion   Needle Size:  22 G Medications:  20 mg methylPREDNISolone  acetate 40 MG/ML; 2 mL bupivacaine  0.25 % Patient Tolerance:  Patient tolerated the procedure well with no immediate complications  Procedure: Plantar fasciitis injection, Right After discussion on R/B/I and informed verbal consent was obtained, a timeout was performed. Patient was lying prone on exam table. Medial aspect of inferior calcaneus was identified with point of maximum tenderness and area was cleaned with betadine  and alcohol swab. Then utilizing ultrasound guidance via an in-plane approach, the patient's plantar fasica of right foot was injected with 2:1 bupivicaine: depomedrol with multiple needle fenestrations from a medial to lateral in-plane approach. Patient tolerated procedure well without immediate complications.   - patient tolerated procedure well, discussed post-injection protocol - follow-up as needed  Brunet Burnetta, DO Primary Care Sports Medicine Physician  [facility] - Orthopedics  This note was dictated using Dragon naturally speaking software and may contain errors in syntax, spelling, or content which have not been identified prior to signing this note.

## 2024-02-27 ENCOUNTER — Inpatient Hospital Stay

## 2024-02-27 VITALS — BP 101/72 | HR 63 | Temp 98.4°F | Resp 18

## 2024-02-27 DIAGNOSIS — D500 Iron deficiency anemia secondary to blood loss (chronic): Secondary | ICD-10-CM

## 2024-02-27 MED ORDER — IRON SUCROSE 300 MG IVPB - SIMPLE MED
300.0000 mg | Freq: Once | Status: AC
  Administered 2024-02-27: 300 mg via INTRAVENOUS
  Filled 2024-02-27: qty 300

## 2024-02-27 MED ORDER — SODIUM CHLORIDE 0.9 % IV SOLN: Freq: Once | INTRAVENOUS | Status: AC

## 2024-02-27 NOTE — Patient Instructions (Signed)

## 2024-03-02 ENCOUNTER — Telehealth: Payer: Self-pay

## 2024-03-02 NOTE — Telephone Encounter (Signed)
 Copied from CRM #[id number]. Topic: General - Other >> Mar 02, 2024 11:46 AM Macario HERO wrote: Reason for CRM: Patient calling regarding work from home medical accommodation letter. Patient is requesting a call back from her provider or her nurse as soon as possible.

## 2024-03-02 NOTE — Telephone Encounter (Signed)
 Called pt back was not able to leave a VM due to mailbox being full.

## 2024-03-05 ENCOUNTER — Inpatient Hospital Stay

## 2024-03-12 ENCOUNTER — Inpatient Hospital Stay: Attending: Family Medicine

## 2024-03-23 ENCOUNTER — Other Ambulatory Visit: Payer: Self-pay | Admitting: *Deleted

## 2024-03-23 DIAGNOSIS — D500 Iron deficiency anemia secondary to blood loss (chronic): Secondary | ICD-10-CM

## 2024-03-23 MED ORDER — FOLIC ACID 1 MG PO TABS
1.0000 mg | ORAL_TABLET | Freq: Every day | ORAL | 11 refills | Status: AC
Start: 2024-03-23 — End: ?

## 2024-04-05 ENCOUNTER — Encounter: Payer: Self-pay | Admitting: Radiology

## 2024-04-13 ENCOUNTER — Encounter: Payer: Self-pay | Admitting: Family Medicine
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
## 2024-04-15 ENCOUNTER — Ambulatory Visit: Admitting: Family Medicine

## 2024-04-15 VITALS — BP 124/70 | HR 77 | Ht 66.0 in | Wt 262.6 lb

## 2024-04-15 DIAGNOSIS — F430 Acute stress reaction: Secondary | ICD-10-CM

## 2024-04-15 DIAGNOSIS — Z23 Encounter for immunization: Secondary | ICD-10-CM | POA: Diagnosis not present

## 2024-04-15 MED ORDER — FLUOXETINE HCL 20 MG PO CAPS: 20.0000 mg | ORAL_CAPSULE | Freq: Every day | ORAL | 3 refills | Status: DC

## 2024-04-15 MED ORDER — HYDROXYZINE PAMOATE 25 MG PO CAPS: 25.0000 mg | ORAL_CAPSULE | Freq: Three times a day (TID) | ORAL | 1 refills | Status: AC | PRN

## 2024-04-15 NOTE — Patient Instructions (Addendum)
 Good to see you today- I am sorry you are having such a hard time Let's start you on prozac  20 mg daily; can increase to 40 mg after 1-2 weeks as needed Also can use hydroxyzine  as needed for acute anxiety/ panic. This may cause you to feel a bit sleepy Please keep me posted about how you are doing and I will watch for LOA paperwork   [facility];  Address: [address], [place], [place] [address] Phone: [phone]  There are many counselors you can see in our area on online- of your choice is ok!

## 2024-04-20 ENCOUNTER — Ambulatory Visit
Attending: Student in an Organized Health Care Education/Training Program | Admitting: Student in an Organized Health Care Education/Training Program

## 2024-04-20 ENCOUNTER — Encounter: Payer: Self-pay | Admitting: Student in an Organized Health Care Education/Training Program

## 2024-04-20 ENCOUNTER — Encounter: Payer: Self-pay | Admitting: Family Medicine

## 2024-04-20 NOTE — Progress Notes (Incomplete)
  Cardiology Office Note:   Date:  04/20/2024  ID:  Benton Clenton Jude, DOB 1985-09-23, MRN [id number] PCP: Watt Harlene BROCKS, MD  [facility] Providers Cardiologist:  None { Chief Complaint: No chief complaint on file.     History of Present Illness:   Melanie Stout is a 38 y.o. female with a PMH of HTN, obesity, hidradenitis suppurativa, tobacco use disorder who presents as a new patient referral by Dr. Watt for the evaluation of chest pain.   Has been to the ED twice in August and September for palpitations and lightheadedness.  Troponins negative during each ED visit.  CXR and CT head both WNL.  ECG in August without ischemic changes.  Past Medical History:  Diagnosis Date   Abnormal Pap smear    Blood transfusion without reported diagnosis 01/05/2013   Headache(784.0)    History of postpartum hemorrhage, currently pregnant    Hx of varicella    Hyperlipidemia      Studies Reviewed:    EKG: ***           Risk Assessment/Calculations:   {Does this patient have ATRIAL FIBRILLATION?:[id number]} No BP recorded.  {Refresh Note OR Click here to enter BP  :1}***        Physical Exam:     VS:  There were no vitals taken for this visit. ***    Wt Readings from Last 3 Encounters:  04/15/24 262 lb 9.6 oz (119.1 kg)  01/29/24 264 lb (119.7 kg)  01/27/24 260 lb (117.9 kg)     GEN: Well nourished, well developed, in no acute distress NECK: No JVD; No carotid bruits CARDIAC: ***RRR, no murmurs, rubs, gallops RESPIRATORY:  Clear to auscultation without rales, wheezing or rhonchi  ABDOMEN: Soft, non-tender, non-distended, normal bowel sounds EXTREMITIES:  Warm and well perfused, no edema; No deformity, 2+ radial pulses PSYCH: Normal mood and affect   Assessment & Plan       {Are you ordering a CV Procedure (e.g. stress test, cath, DCCV, TEE, etc)?   Press F2        :[id number]}   This note was written with the assistance of a  dictation microphone or AI dictation software. Please excuse any typos or grammatical errors.   Signed, Georganna Archer, MD 04/20/2024 1:22 PM    [facility]

## 2024-04-28 ENCOUNTER — Encounter: Payer: Self-pay | Admitting: Family

## 2024-05-03 ENCOUNTER — Inpatient Hospital Stay: Attending: Family Medicine

## 2024-05-03 ENCOUNTER — Inpatient Hospital Stay: Admitting: Medical Oncology

## 2024-05-25 ENCOUNTER — Encounter: Payer: Self-pay | Admitting: Sports Medicine

## 2024-05-25 ENCOUNTER — Ambulatory Visit: Payer: Self-pay | Admitting: Sports Medicine

## 2024-05-25 ENCOUNTER — Other Ambulatory Visit: Payer: Self-pay

## 2024-05-25 DIAGNOSIS — M79671 Pain in right foot: Secondary | ICD-10-CM | POA: Diagnosis not present

## 2024-05-25 DIAGNOSIS — M2141 Flat foot [pes planus] (acquired), right foot: Secondary | ICD-10-CM | POA: Diagnosis not present

## 2024-05-25 DIAGNOSIS — M2142 Flat foot [pes planus] (acquired), left foot: Secondary | ICD-10-CM

## 2024-05-25 DIAGNOSIS — M722 Plantar fascial fibromatosis: Secondary | ICD-10-CM | POA: Diagnosis not present

## 2024-05-25 MED ORDER — LIDOCAINE HCL 1 % IJ SOLN
3.0000 mL | INTRAMUSCULAR | Status: AC | PRN
  Administered 2024-05-25: 3 mL

## 2024-05-25 MED ORDER — BUPIVACAINE HCL 0.25 % IJ SOLN
2.0000 mL | INTRAMUSCULAR | Status: AC | PRN
  Administered 2024-05-25: 2 mL

## 2024-05-25 MED ORDER — MELOXICAM 15 MG PO TABS: 15.0000 mg | ORAL_TABLET | Freq: Every day | ORAL | 0 refills | Status: AC

## 2024-05-25 MED ORDER — BETAMETHASONE SOD PHOS & ACET 6 (3-3) MG/ML IJ SUSP
6.0000 mg | INTRAMUSCULAR | Status: AC | PRN
  Administered 2024-05-25: 6 mg via INTRA_ARTICULAR

## 2024-05-25 NOTE — Progress Notes (Signed)
 "  Melanie Stout - 38 y.o. female MRN [id number]  Date of birth: 02-Oct-1985  Office Visit Note: Visit Date: 05/25/2024 PCP: Watt Harlene BROCKS, MD Referred by: Watt Harlene BROCKS, MD  Subjective: Chief Complaint  Patient presents with   Right Heel - Follow-up   Left Heel - Follow-up   HPI: Melanie Stout is a pleasant 38 y.o. female who presents today for follow-up of bilateral heel/plantar fasciitis.  Melanie Stout presents for reoccurrence of her heel pain/plantar fasciitis.  Back in September, we did perform bilateral ultrasound-guided plantar fascia injections which gave her excellent relief for at least 3 months.  She states she essentially had 100% pain relief initially. Here recently, her right foot/heel pain has returned and is quite bothersome.  Feels this also in the left side but is much more pain.  We did give her some home rehab exercises which she has done in the past, has done this once but nothing consistently.  Has trialed anti-inflammatories in the past but does not take any currently.  No orthotic insoles yet but is open to considering this as we have discussed at previous visits, looking into [employer].  Pertinent ROS were reviewed with the patient and found to be negative unless otherwise specified above in HPI.   Assessment & Plan: Visit Diagnoses:  1. Plantar fasciitis, bilateral   2. Intractable right heel pain   3. Pes planus of both feet    Plan: Impression is acute on chronic bilateral plantar fascial heel pain with exacerbation of right > left foot/heel.  She had essentially 100% pain relief from previous plantar fascia ultrasound-guided injections, which does point to this being the diagnosis with recent exacerbation.  Discussed her pes planus foot shape and Achilles contracture which is contributing.  I did print out over-the-counter orthotic insoles which she may purchase on her own and/or she may go to [employer] companies or neighboring locations  to help place her into once that are supportive.  Given that her right foot is significantly painful, we discussed trialing 1 additional plantar fascia injection, although we did discuss the nature of avoiding these cumulatively over time.  We did perform a right plantar fascia injection under ultrasound guidance today.  Advised on postinjection protocol.  I would like her to become consistent with starting her rehab exercises at home after 72 hours of modified rest/activity.  We will start her on a short course of meloxicam  15 mg for the next 2-3 weeks scheduled and then taper to discontinue.  I will see her back in 1 month for reevaluation.  If for some reason she is having ongoing heel pain despite the above, could consider MRI.  Follow-up: Return in about 1 month (around 06/25/2024) for Bilateral Plantar fascia   Meds & Orders:  Meds ordered this encounter  Medications   meloxicam  (MOBIC ) 15 MG tablet    Sig: Take 1 tablet (15 mg total) by mouth daily.    Dispense:  30 tablet    Refill:  0    Orders Placed This Encounter  Procedures   Foot Inj: right plantar fascia   US  Guided Needle Placement - No Linked Charges     Procedures: Foot Inj: right plantar fascia  Date/Time: 05/25/2024 9:10 AM  Performed by: Burnetta Brunet, DO Authorized by: Burnetta Brunet, DO   Consent Given by:  Patient Site marked: the procedure site was marked   Timeout: prior to procedure the correct patient, procedure, and site was verified   Indications:  Fasciitis and pain Condition: Plantar Fasciitis   Location: right plantar fascia muscle   Prep: patient was prepped and draped in usual sterile fashion   Needle Size:  22 G Medications:  3 mL lidocaine  1 %; 2 mL bupivacaine  0.25 %; 6 mg betamethasone  acetate-betamethasone  sodium phosphate  6 (3-3) MG/ML Patient Tolerance:  Patient tolerated the procedure well with no immediate complications  Procedure: Plantar fasciitis injection, Right After discussion on  R/B/I and informed verbal consent was obtained, a timeout was performed. Patient was lying prone on exam table. Medial aspect of inferior calcaneus was identified with point of maximum tenderness and area was cleaned with betadine  and alcohol swab. Then utilizing ultrasound guidance via an in-plane approach, the patient's plantar fasica of right foot was injected with 2:1 bupivicaine:celestone  with multiple needle fenestrations from a medial to lateral in-plane approach. Patient tolerated procedure well without immediate complications.        Clinical History: No specialty comments available.  She reports that she quit smoking about 7 years ago. Her smoking use included cigarettes. She smoked an average of 0.5 packs per day. She has never used smokeless tobacco.  Recent Labs    08/06/23 1410  HGBA1C 5.0    Objective:    Physical Exam  Gen: Well-appearing, in no acute distress; non-toxic CV: Well-perfused. Warm.  Resp: Breathing unlabored on room air; no wheezing. Psych: Fluid speech in conversation; appropriate affect; normal thought process  Ortho Exam - Bilateral feet: There is bilateral pes planus with loss of longitudinal arch upon standing.  There is rather significant tenderness over the plantar aspect of the right heel/plantar fascia, minimal to mild on the left.  Negative calcaneal heel squeeze.  Imaging:  02/16/24: Three-view x-ray of the right and left foot was ordered and reviewed by  myself today including AP, lateral and oblique film.  The right foot has a  very tiny plantar calcaneal spur as well as a mild degree of midfoot  arthritic change.  No acute fracture noted.  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  No acute bony abnormality or fracture noted.   Past Medical/Family/Surgical/Social History: Medications & Allergies reviewed per EMR, new medications updated. Patient Active Problem List    Diagnosis Date Noted   Plantar fasciitis 12/16/2023   Heat intolerance 12/16/2023   Hypertension 12/16/2023   Term pregnancy 05/22/2023   Hidradenitis suppurativa 10/04/2022   IDA (iron  deficiency anemia) 08/02/2022   S/P cesarean section 03/26/2022   Obesity, unspecified obesity severity, unspecified obesity type 08/01/2016   Tobacco use disorder 06/05/2016   Past Medical History:  Diagnosis Date   Abnormal Pap smear    Blood transfusion without reported diagnosis 01/05/2013   Headache(784.0)    History of postpartum hemorrhage, currently pregnant    Hx of varicella    Hyperlipidemia    Family History  Problem Relation Age of Onset   Hypertension Mother    Parkinson's disease Father    Diabetes Maternal Aunt    Past Surgical History:  Procedure Laterality Date   CESAREAN SECTION N/A 03/26/2022   Procedure: CESAREAN SECTION;  Surgeon: Dannielle Bouchard, DO;  Location: [facility];  Service: Obstetrics;  Laterality: N/A;   CESAREAN SECTION MULTI-GESTATIONAL WITH TUBAL Bilateral 05/22/2023   Procedure: REPEAT CESAREAN SECTION X1 WITH BILATERAL TUBAL LIGATION;  Surgeon: Curlene Agent, MD;  Location: [facility] LD ORS;  Service: Obstetrics;  Laterality: Bilateral;   NOSE SURGERY     Social  History   Occupational History   Not on file  Tobacco Use   Smoking status: Former    Current packs/day: 0.00    Average packs/day: 0.5 packs/day    Types: Cigarettes    Quit date: 07/24/2016    Years since quitting: 7.8   Smokeless tobacco: Never  Vaping Use   Vaping status: Never Used  Substance and Sexual Activity   Alcohol use: Not Currently    Comment: occ   Drug use: No   Sexual activity: Yes    Birth control/protection: None   "

## 2024-05-25 NOTE — Progress Notes (Signed)
 Patient says that she got about 3 months of relief from the injections, but her pain has returned. She says that the right foot feels worse than the left, and it is difficult to put pressure through the right foot. She did her home exercises for one day, but has not continued them. She would like to repeat injections today, if possible.

## 2024-06-01 ENCOUNTER — Encounter: Payer: Self-pay | Admitting: Family Medicine
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
## 2024-06-02 ENCOUNTER — Telehealth: Admitting: Family Medicine

## 2024-06-02 ENCOUNTER — Encounter: Payer: Self-pay | Admitting: Family

## 2024-06-02 ENCOUNTER — Ambulatory Visit: Admitting: Sports Medicine

## 2024-06-02 DIAGNOSIS — F430 Acute stress reaction: Secondary | ICD-10-CM | POA: Diagnosis not present

## 2024-06-02 MED ORDER — FLUOXETINE HCL 40 MG PO CAPS: 40.0000 mg | ORAL_CAPSULE | Freq: Every day | ORAL | 1 refills | Status: AC

## 2024-06-04 NOTE — Telephone Encounter (Signed)
 Note from 06/02/2024 has been faxed over to [facility].

## 2024-06-10 ENCOUNTER — Encounter: Payer: Self-pay | Admitting: Family

## 2024-06-12 NOTE — Progress Notes (Unsigned)
 [facility] [address] [place], [place] [address] [phone]  Date:  06/14/2024   Name:  Melanie Stout   DOB:  02/05/1986   MRN:  [id number]  PCP:  Watt Harlene BROCKS, MD    Chief Complaint: No chief complaint on file.   History of Present Illness:  Melanie Stout is a 39 y.o. very pleasant female patient who presents with the following:  Patient seen today to discuss to leave paperwork for her job Virtual visit today.  Patient is at home and I am at work.  Patient identity department 2 factors, she gives consent for virtual visit today.  The patient and myself are present on the call today  -history of iron  deficiency, obesity, hidradenitis suppurativa, hyperlipidemia, anxiety and interpersonal stressors She is on sertraline  and losartan   She works for [employer] Her most recent visit together was in November when we discussed a leave of absence, we also did a virtual visit in December at which point she was planning to return to work in a few days  Today when he needs to update her previous work from home paperwork.  She is currently working from home every day/5 days/week and needs to continue doing this.  She notes that she suffers from severe anxiety which is better when she is at home.  Traveling to the office and working in an office setting exacerbates her anxiety.  In addition, she also needs 2 additional 15-minute breaks per day and to be finished with her workday by 3 PM.  These accommodations are needed also for her anxiety as well as chronic foot pain  Patient Active Problem List   Diagnosis Date Noted   Plantar fasciitis 12/16/2023   Heat intolerance 12/16/2023   Hypertension 12/16/2023   Term pregnancy 05/22/2023   Hidradenitis suppurativa 10/04/2022   IDA (iron  deficiency anemia) 08/02/2022   S/P cesarean section 03/26/2022   Obesity, unspecified obesity severity, unspecified  obesity type 08/01/2016   Tobacco use disorder 06/05/2016    Past Medical History:  Diagnosis Date   Abnormal Pap smear    Blood transfusion without reported diagnosis 01/05/2013   Headache(784.0)    History of postpartum hemorrhage, currently pregnant    Hx of varicella    Hyperlipidemia     Past Surgical History:  Procedure Laterality Date   CESAREAN SECTION N/A 03/26/2022   Procedure: CESAREAN SECTION;  Surgeon: Dannielle Bouchard, DO;  Location: [facility];  Service: Obstetrics;  Laterality: N/A;   CESAREAN SECTION MULTI-GESTATIONAL WITH TUBAL Bilateral 05/22/2023   Procedure: REPEAT CESAREAN SECTION X1 WITH BILATERAL TUBAL LIGATION;  Surgeon: Curlene Agent, MD;  Location: [facility];  Service: Obstetrics;  Laterality: Bilateral;   NOSE SURGERY      Social History[1]  Family History  Problem Relation Age of Onset   Hypertension Mother    Parkinson's disease Father    Diabetes Maternal Aunt     Allergies[2]  Medication list has been reviewed and updated.  Medications Ordered Prior to Encounter[3]  Review of Systems:  As per HPI- otherwise negative.   Physical Examination: There were no vitals filed for this visit. There were no vitals filed for this visit. There is no height or weight on file to calculate BMI. Ideal Body Weight:    Patient observed via MyChart video.  She looks well and her normal self  Assessment and Plan: Acute stress reaction  Plantar fasciitis  Completed paperwork as requested by patient and will fax to [employer] for her.  We requested that she work from home daily for the next 90 days I have also requested 2 additional 15-minute breaks per workday and that she complete her workday by 3 PM on a daily basis Signed Harlene Schroeder, MD     [1]  Social History Tobacco Use   Smoking status: Former    Current packs/day: 0.00    Average packs/day: 0.5 packs/day    Types: Cigarettes    Quit date: 07/24/2016    Years since quitting:  7.8   Smokeless tobacco: Never  Vaping Use   Vaping status: Never Used  Substance Use Topics   Alcohol use: Not Currently    Comment: occ   Drug use: No  [2] No Known Allergies [3]  Current Outpatient Medications on File Prior to Visit  Medication Sig Dispense Refill   FLUoxetine  (PROZAC ) 40 MG capsule Take 1 capsule (40 mg total) by mouth daily. 90 capsule 1   folic acid  (FOLVITE ) 1 MG tablet Take 1 tablet (1 mg total) by mouth daily. 30 tablet 11   hydrOXYzine  (VISTARIL ) 25 MG capsule Take 1 capsule (25 mg total) by mouth every 8 (eight) hours as needed. 40 capsule 1   losartan  (COZAAR ) 50 MG tablet Take 1 tablet (50 mg total) by mouth daily. 90 tablet 3   meloxicam  (MOBIC ) 15 MG tablet Take 1 tablet (15 mg total) by mouth daily. 30 tablet 0   No current facility-administered medications on file prior to visit.   "

## 2024-06-14 ENCOUNTER — Telehealth: Admitting: Family Medicine

## 2024-06-14 VITALS — Ht 66.0 in

## 2024-06-14 DIAGNOSIS — F430 Acute stress reaction: Secondary | ICD-10-CM

## 2024-06-14 DIAGNOSIS — M722 Plantar fascial fibromatosis: Secondary | ICD-10-CM | POA: Diagnosis not present

## 2024-06-25 NOTE — Telephone Encounter (Signed)
 Forms have been faxed to new number provided.

## 2024-06-25 NOTE — Telephone Encounter (Signed)
 Yes ma'am, I will refax it.

## 2024-06-28 ENCOUNTER — Encounter: Payer: Self-pay | Admitting: Family

## 2024-06-30 ENCOUNTER — Encounter: Payer: Self-pay | Admitting: Family Medicine

## 2024-06-30 ENCOUNTER — Encounter: Admitting: Emergency Medicine

## 2024-06-30 NOTE — Progress Notes (Signed)
 Pt intended to set up virtual encounter with pcp, not complete evisit. I will cancel this evisit.   Jon Belt, PhD, FNP-BC [facility] Phone: [phone] 06/30/2024 7:58 PM

## 2024-07-05 ENCOUNTER — Encounter: Payer: Self-pay | Admitting: Family Medicine

## 2024-07-07 ENCOUNTER — Telehealth: Admitting: Family Medicine

## 2024-07-07 VITALS — Ht 66.0 in

## 2024-07-07 DIAGNOSIS — Z6841 Body Mass Index (BMI) 40.0 and over, adult: Secondary | ICD-10-CM

## 2024-07-07 MED ORDER — WEGOVY 0.25 MG/0.5ML ~~LOC~~ SOAJ: 0.2500 mg | SUBCUTANEOUS | 1 refills | Status: AC
